# Patient Record
Sex: Male | Born: 1990 | Race: Black or African American | Hispanic: No | Marital: Married | Smoking: Never smoker
Health system: Southern US, Community
[De-identification: ages and names within clinical notes are randomized; demographics above are authoritative.]

## PROBLEM LIST (undated history)

## (undated) DIAGNOSIS — R000 Tachycardia, unspecified: Secondary | ICD-10-CM

## (undated) DIAGNOSIS — E119 Type 2 diabetes mellitus without complications: Secondary | ICD-10-CM

## (undated) DIAGNOSIS — U071 COVID-19: Secondary | ICD-10-CM

## (undated) DIAGNOSIS — E785 Hyperlipidemia, unspecified: Secondary | ICD-10-CM

## (undated) DIAGNOSIS — I10 Essential (primary) hypertension: Secondary | ICD-10-CM

## (undated) DIAGNOSIS — J45909 Unspecified asthma, uncomplicated: Secondary | ICD-10-CM

## (undated) HISTORY — DX: Hyperlipidemia, unspecified: E78.5

## (undated) HISTORY — DX: Tachycardia, unspecified: R00.0

## (undated) HISTORY — DX: Type 2 diabetes mellitus without complications: E11.9

## (undated) HISTORY — DX: Morbid (severe) obesity due to excess calories: E66.01

## (undated) HISTORY — DX: COVID-19: U07.1

## (undated) HISTORY — DX: Unspecified asthma, uncomplicated: J45.909

## (undated) HISTORY — DX: Essential (primary) hypertension: I10

## (undated) HISTORY — PX: OTHER SURGICAL HISTORY: SHX169

---
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
## 2010-06-04 IMAGING — CR LEFT GREAT TOE
1 series · 3 of 3 positions shown · non-contrast
Comparison: none

REASON FOR EXAM: Injury
COMMENTS:

PROCEDURE:     DXR - DXR TOE GREAT (1ST DIGIT) LT NAZARETH  - November 18, 2007  [DATE]
RESULT:     Images of the LEFT, great toe demonstrate no fracture,
dislocation or radiopaque foreign body.

[Series 1: view not recorded · 0.17mm/px · 3 of 3 slices shown]
[im 1/3]
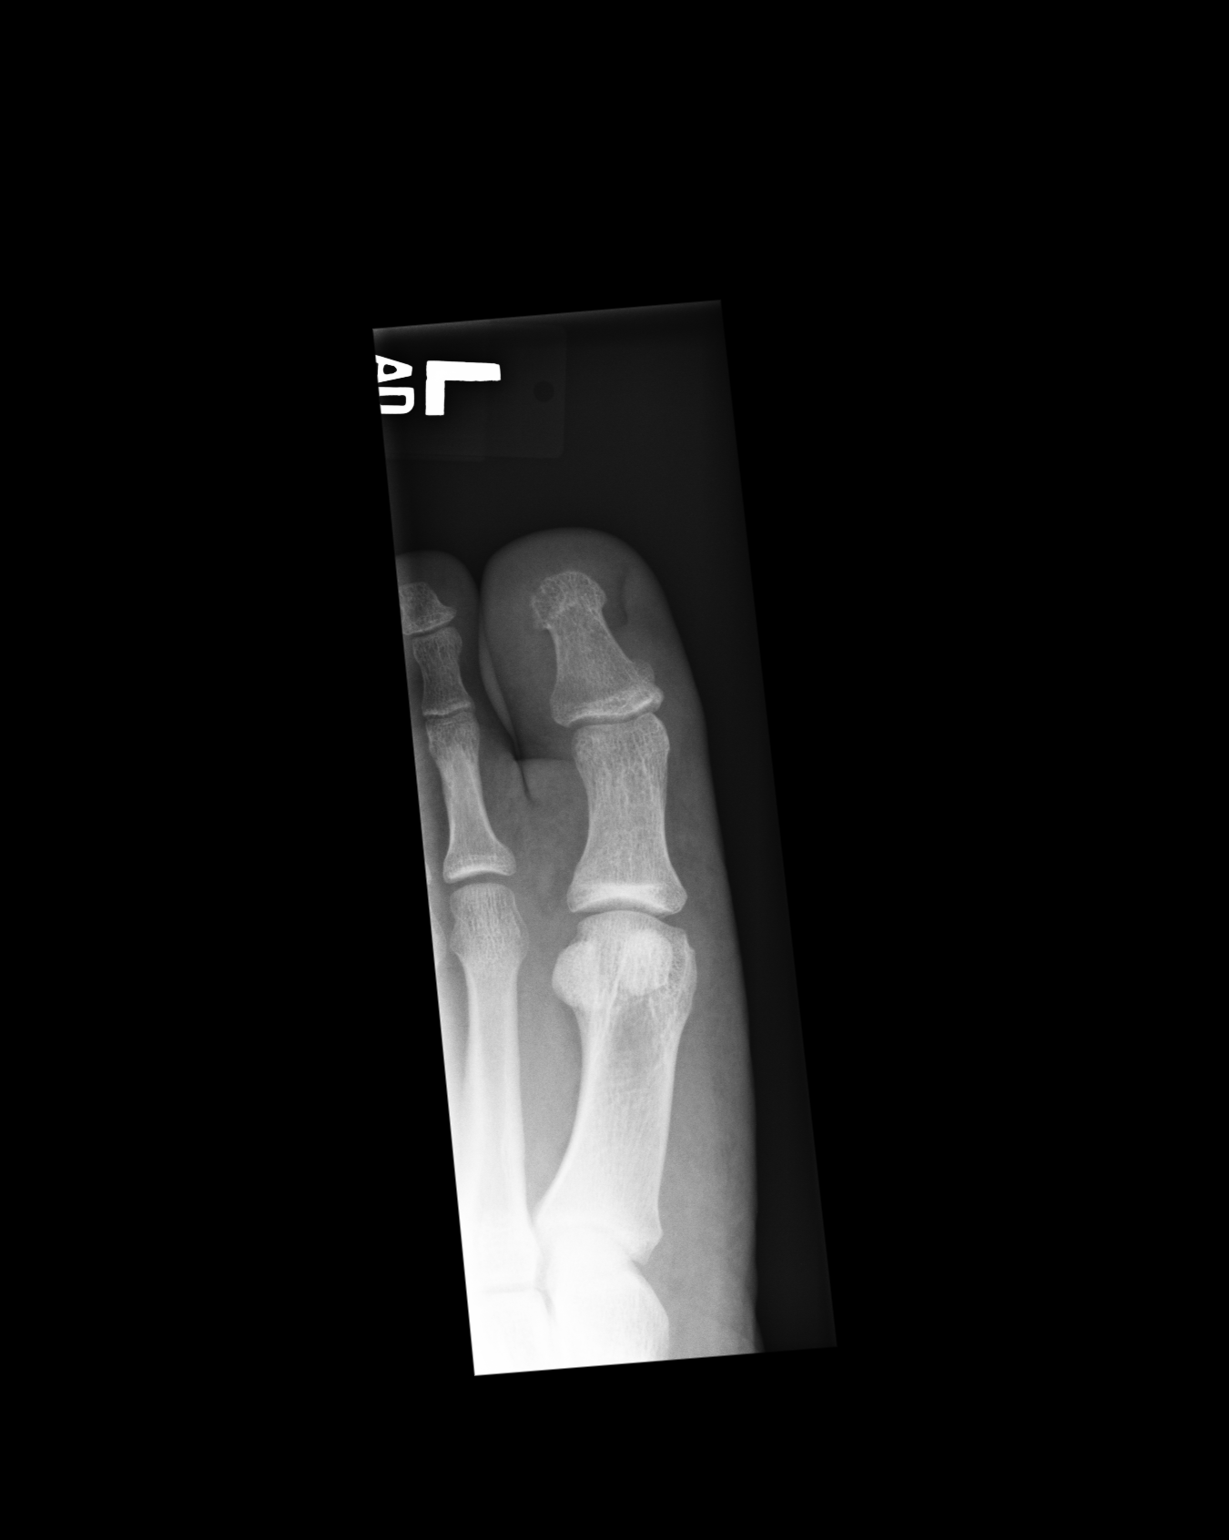
[im 2/3]
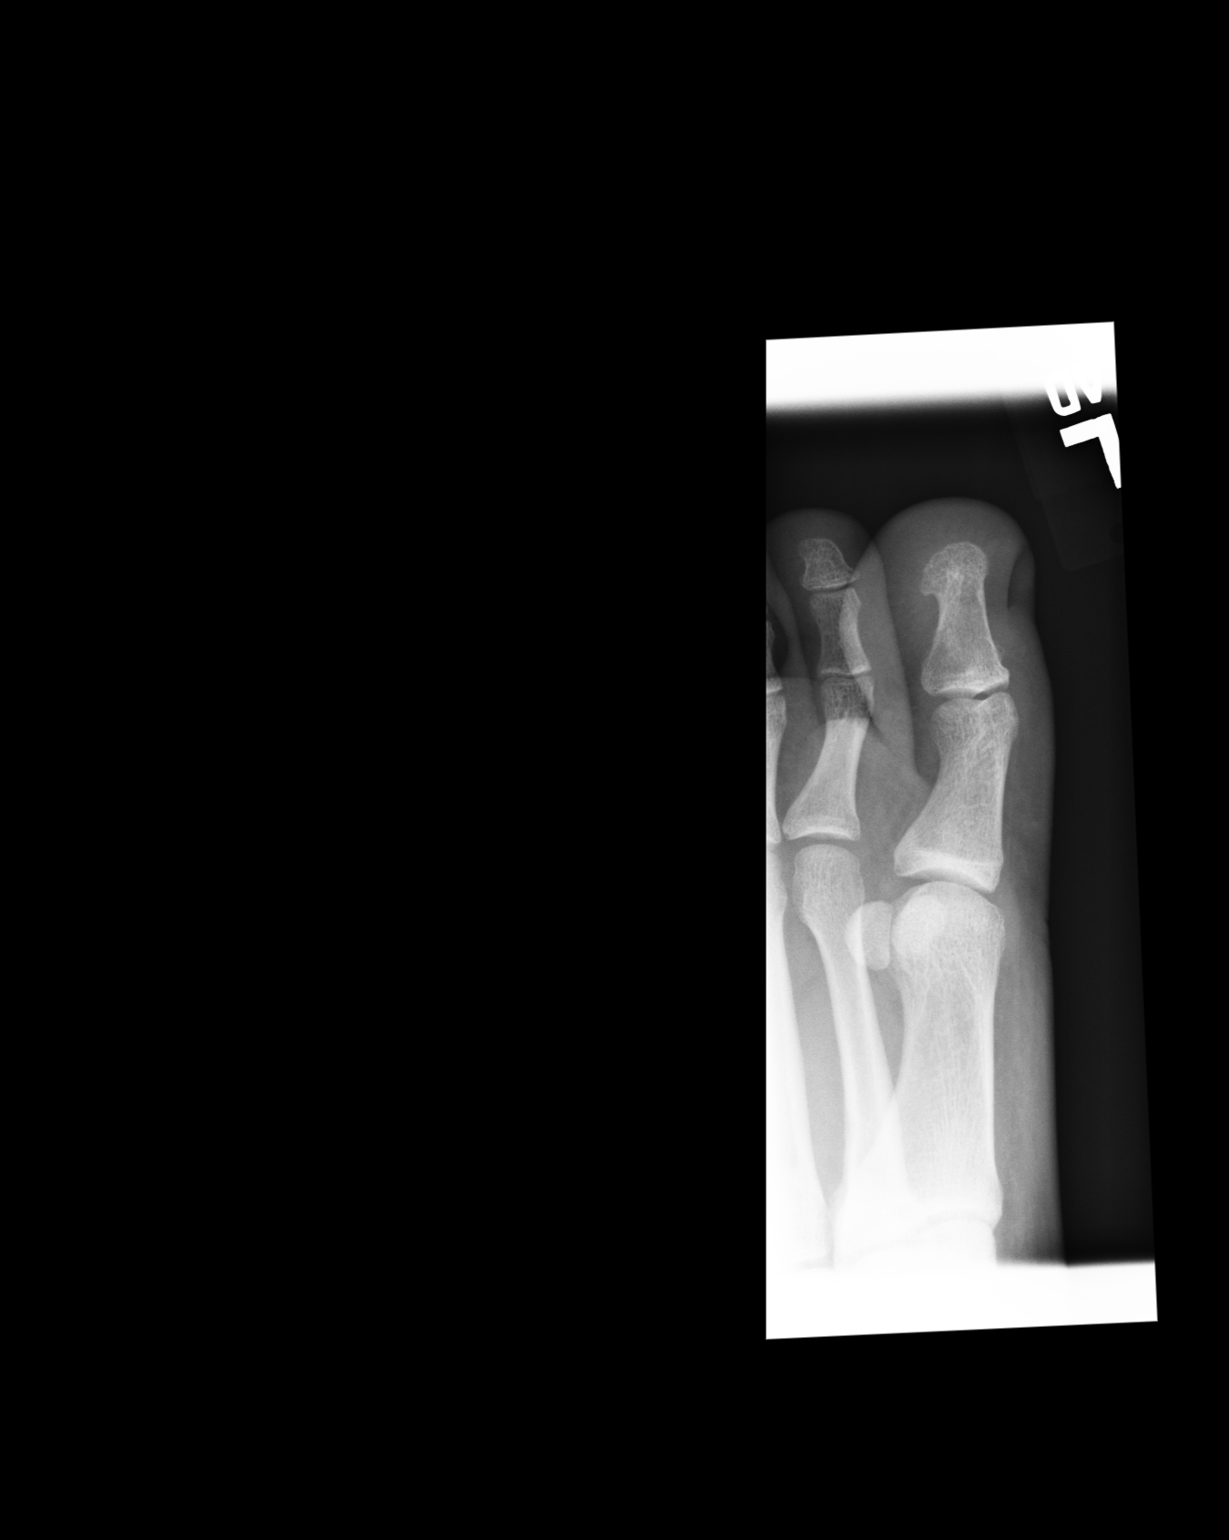
[im 3/3]
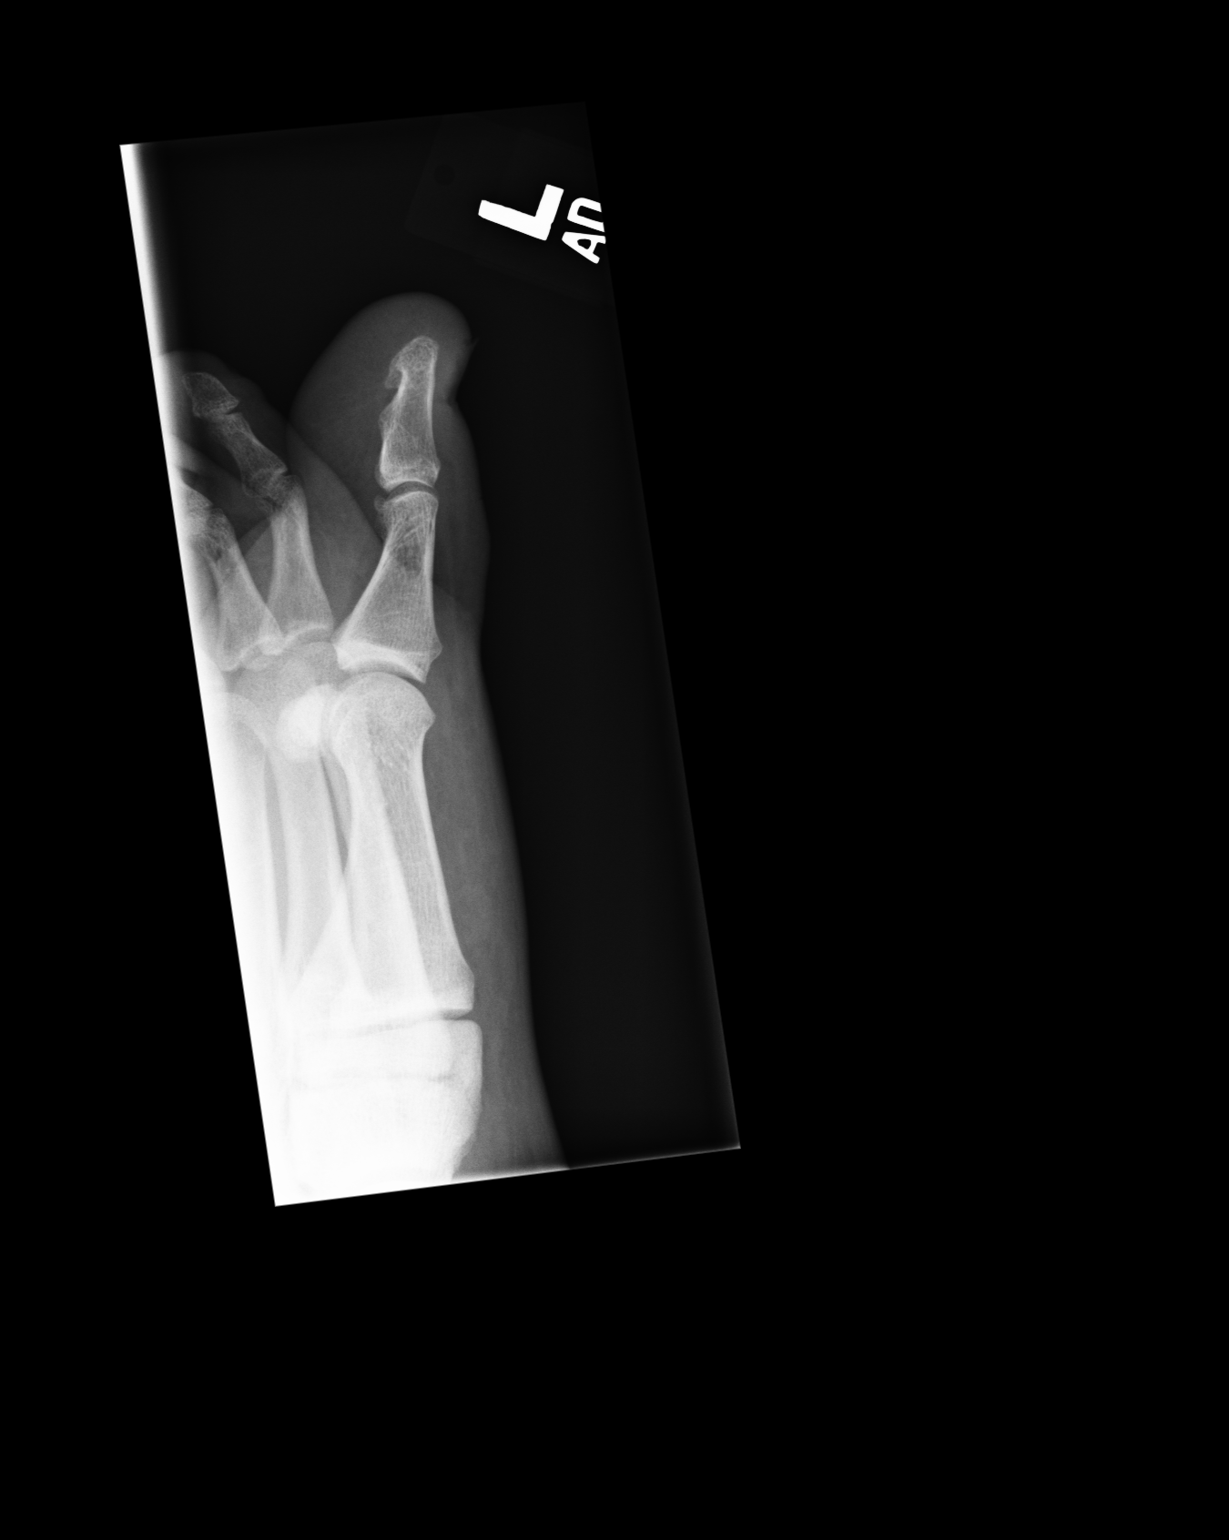

[3 of 3 positions shown; findings below may reference images not displayed]

IMPRESSION: Please see above.

## 2013-07-12 ENCOUNTER — Emergency Department: Payer: Self-pay | Admitting: Emergency Medicine

## 2015-09-05 ENCOUNTER — Encounter: Payer: Self-pay | Admitting: Physician Assistant

## 2015-09-05 ENCOUNTER — Ambulatory Visit: Payer: Self-pay | Admitting: Physician Assistant

## 2015-09-05 VITALS — BP 110/70 | HR 67 | Temp 98.1°F

## 2015-09-05 DIAGNOSIS — M545 Low back pain, unspecified: Secondary | ICD-10-CM

## 2015-09-05 MED ORDER — CYCLOBENZAPRINE HCL 10 MG PO TABS: 10.0000 mg | ORAL_TABLET | Freq: Three times a day (TID) | ORAL | 0 refills | Status: DC | PRN

## 2015-09-05 NOTE — Progress Notes (Addendum)
S:  C/o low back pain for 1 day, ? injury, helped build a fence yesterday, pain is worse with movement, increased with bending over, denies numbness, tingling, or changes in bowel/urinary habits, didn't try any otc meds Remainder ros neg  O:  Vitals wnl, nad, lungs c t a, cv rrr, spine nontender, muscles in r lower back tender , no decreased rom,  Neg slr, pt walks without difficulty, no foot drop noted, n/v intact  A: acute back pain, muscle spasms  P: use wet heat followed by ice, stretches, return to clinic if not better in 3 t 5 days, return earlier if worsening, rx meds: flexeril 10mg  tid prn muscle pain, work note for today, tomorrow if not better

## 2015-09-05 NOTE — Patient Instructions (Addendum)
Back Exercises If you have pain in your back, do these exercises 2-3 times each day or as told by your doctor. When the pain goes away, do the exercises once each day, but repeat the steps more times for each exercise (do more repetitions). If you do not have pain in your back, do these exercises once each day or as told by your doctor. EXERCISES Single Knee to Chest Do these steps 3-5 times in a row for each leg: 1. Lie on your back on a firm bed or the floor with your legs stretched out. 2. Bring one knee to your chest. 3. Hold your knee to your chest by grabbing your knee or thigh. 4. Pull on your knee until you feel a gentle stretch in your lower back. 5. Keep doing the stretch for 10-30 seconds. 6. Slowly let go of your leg and straighten it. Pelvic Tilt Do these steps 5-10 times in a row: 1. Lie on your back on a firm bed or the floor with your legs stretched out. 2. Bend your knees so they point up to the ceiling. Your feet should be flat on the floor. 3. Tighten your lower belly (abdomen) muscles to press your lower back against the floor. This will make your tailbone point up to the ceiling instead of pointing down to your feet or the floor. 4. Stay in this position for 5-10 seconds while you gently tighten your muscles and breathe evenly. Cat-Cow Do these steps until your lower back bends more easily: 1. Get on your hands and knees on a firm surface. Keep your hands under your shoulders, and keep your knees under your hips. You may put padding under your knees. 2. Let your head hang down, and make your tailbone point down to the floor so your lower back is round like the back of a cat. 3. Stay in this position for 5 seconds. 4. Slowly lift your head and make your tailbone point up to the ceiling so your back hangs low (sags) like the back of a cow. 5. Stay in this position for 5 seconds. Press-Ups Do these steps 5-10 times in a row: 1. Lie on your belly (face-down) on the  floor. 2. Place your hands near your head, about shoulder-width apart. 3. While you keep your back relaxed and keep your hips on the floor, slowly straighten your arms to raise the top half of your body and lift your shoulders. Do not use your back muscles. To make yourself more comfortable, you may change where you place your hands. 4. Stay in this position for 5 seconds. 5. Slowly return to lying flat on the floor. Bridges Do these steps 10 times in a row: 1. Lie on your back on a firm surface. 2. Bend your knees so they point up to the ceiling. Your feet should be flat on the floor. 3. Tighten your butt muscles and lift your butt off of the floor until your waist is almost as high as your knees. If you do not feel the muscles working in your butt and the back of your thighs, slide your feet 1-2 inches farther away from your butt. 4. Stay in this position for 3-5 seconds. 5. Slowly lower your butt to the floor, and let your butt muscles relax. If this exercise is too easy, try doing it with your arms crossed over your chest. Belly Crunches Do these steps 5-10 times in a row: 1. Lie on your back on a firm bed  or the floor with your legs stretched out. 2. Bend your knees so they point up to the ceiling. Your feet should be flat on the floor. 3. Cross your arms over your chest. 4. Tip your chin a little bit toward your chest but do not bend your neck. 5. Tighten your belly muscles and slowly raise your chest just enough to lift your shoulder blades a tiny bit off of the floor. 6. Slowly lower your chest and your head to the floor. Back Lifts Do these steps 5-10 times in a row: 1. Lie on your belly (face-down) with your arms at your sides, and rest your forehead on the floor. 2. Tighten the muscles in your legs and your butt. 3. Slowly lift your chest off of the floor while you keep your hips on the floor. Keep the back of your head in line with the curve in your back. Look at the floor while  you do this. 4. Stay in this position for 3-5 seconds. 5. Slowly lower your chest and your face to the floor. GET HELP IF:  Your back pain gets a lot worse when you do an exercise.  Your back pain does not lessen 2 hours after you exercise. If you have any of these problems, stop doing the exercises. Do not do them again unless your doctor says it is okay. GET HELP RIGHT AWAY IF:  You have sudden, very bad back pain. If this happens, stop doing the exercises. Do not do them again unless your doctor says it is okay.   This information is not intended to replace advice given to you by your health care provider. Make sure you discuss any questions you have with your health care provider.   Document Released: 02/27/2010 Document Revised: 10/16/2014 Document Reviewed: 03/21/2014 Elsevier Interactive Patient Education 2016 [place] Injury Prevention Back injuries can be very painful. They can also be difficult to heal. After having one back injury, you are more likely to injure your back again. It is important to learn how to avoid injuring or re-injuring your back. The following tips can help you to prevent a back injury. WHAT SHOULD I KNOW ABOUT PHYSICAL FITNESS?  Exercise for 30 minutes per day on most days of the week or as told by your doctor. Make sure to:  Do aerobic exercises, such as walking, jogging, biking, or swimming.  Do exercises that increase balance and strength, such as tai chi and yoga.  Do stretching exercises. This helps with flexibility.  Try to develop strong belly (abdominal) muscles. Your belly muscles help to support your back.  Stay at a healthy weight. This helps to decrease your risk of a back injury. WHAT SHOULD I KNOW ABOUT MY DIET?  Talk with your doctor about your overall diet. Take supplements and vitamins only as told by your doctor.  Talk with your doctor about how much calcium and vitamin D you need each day. These nutrients help to  prevent weakening of the bones (osteoporosis).  Include good sources of calcium in your diet, such as:  Dairy products.  Green leafy vegetables.  Products that have had calcium added to them (fortified).  Include good sources of vitamin D in your diet, such as:  Milk.  Foods that have had vitamin D added to them. WHAT SHOULD I KNOW ABOUT MY POSTURE?  Sit up straight and stand up straight. Avoid leaning forward when you sit or hunching over when you stand.  Choose chairs that  have good low-back (lumbar) support.  If you work at a desk, sit close to it so you do not need to lean over. Keep your chin tucked in. Keep your neck drawn back. Keep your elbows bent so your arms look like the letter "L" (right angle).  Sit high and close to the steering wheel when you drive. Add a low-back support to your car seat, if needed.  Avoid sitting or standing in one position for very long. Take breaks to get up, stretch, and walk around at least one time every hour. Take breaks every hour if you are driving for long periods of time.  Sleep on your side with your knees slightly bent, or sleep on your back with a pillow under your knees. Do not lie on the front of your body to sleep. WHAT SHOULD I KNOW ABOUT LIFTING, TWISTING, AND REACHING Lifting and Heavy Lifting  Avoid heavy lifting, especially lifting over and over again. If you must do heavy lifting:  Stretch before lifting.  Work slowly.  Rest between lifts.  Use a tool such as a cart or a dolly to move objects if one is available.  Make several small trips instead of carrying one heavy load.  Ask for help when you need it, especially when moving big objects.  Follow these steps when lifting:  Stand with your feet shoulder-width apart.  Get as close to the object as you can. Do not pick up a heavy object that is far from your body.  Use handles or lifting straps if they are available.  Bend at your knees. Squat down, but keep  your heels off the floor.  Keep your shoulders back. Keep your chin tucked in. Keep your back straight.  Lift the object slowly while you tighten the muscles in your legs, belly, and butt. Keep the object as close to the center of your body as possible.  Follow these steps when putting down a heavy load:  Stand with your feet shoulder-width apart.  Lower the object slowly while you tighten the muscles in your legs, belly, and butt. Keep the object as close to the center of your body as possible.  Keep your shoulders back. Keep your chin tucked in. Keep your back straight.  Bend at your knees. Squat down, but keep your heels off the floor.  Use handles or lifting straps if they are available. Twisting and Reaching  Avoid lifting heavy objects above your waist.  Do not twist at your waist while you are lifting or carrying a load. If you need to turn, move your feet.  Do not bend over without bending at your knees.  Avoid reaching over your head, across a table, or for an object on a high surface.  WHAT ARE SOME OTHER TIPS?  Avoid wet floors and icy ground. Keep sidewalks clear of ice to prevent falls.   Do not sleep on a mattress that is too soft or too hard.   Keep items that you use often within easy reach.   Put heavier objects on shelves at waist level, and put lighter objects on lower or higher shelves.  Find ways to lower your stress, such as:  Exercise.  Massage.  Relaxation techniques.  Talk with your doctor if you feel anxious or depressed. These conditions can make back pain worse.  Wear flat heel shoes with cushioned soles.  Avoid making quick (sudden) movements.  Use both shoulder straps when carrying a backpack.  Do not use any  tobacco products, including cigarettes, chewing tobacco, or electronic cigarettes. If you need help quitting, ask your doctor.   This information is not intended to replace advice given to you by your health care provider.  Make sure you discuss any questions you have with your health care provider.   Document Released: 07/14/2007 Document Revised: 06/11/2014 Document Reviewed: 01/29/2014 Elsevier Interactive Patient Education [employer].

## 2016-01-09 ENCOUNTER — Ambulatory Visit: Payer: Self-pay | Admitting: Physician Assistant

## 2016-01-09 ENCOUNTER — Encounter: Payer: Self-pay | Admitting: Physician Assistant

## 2016-01-09 VITALS — BP 139/89 | HR 90 | Temp 98.7°F

## 2016-01-09 DIAGNOSIS — J029 Acute pharyngitis, unspecified: Secondary | ICD-10-CM

## 2016-01-09 LAB — POCT RAPID STREP A (OFFICE): Rapid Strep A Screen: NEGATIVE

## 2016-01-09 MED ORDER — AZITHROMYCIN 250 MG PO TABS: ORAL_TABLET | ORAL | 0 refills | Status: DC

## 2016-01-09 NOTE — Progress Notes (Signed)
S: C/o sore throat and congestion for 3 days, no fever, chills, cp/sob, v/d; mucus is green and thick, cough is sporadic, c/o of facial and dental pain. Brother has the same sx but hasn't really been around him, they both work at the [employer]  Using otc meds:   O: PE: vitals wnl, nad, perrl eomi, normocephalic, tms dull, nasal mucosa red and swollen, throat injected, neck supple no lymph, lungs c t a, cv rrr, neuro intact, q strep neg  A:  Acute pharyngitis   P: drink fluids, continue regular meds , use otc meds of choice, return if not improving in 5 days, return earlier if worsening , zpack, work note for tonight, if has fever should not go tomorrow

## 2016-06-21 ENCOUNTER — Ambulatory Visit: Payer: Self-pay | Admitting: Family

## 2016-06-21 VITALS — BP 140/89 | HR 92 | Temp 98.3°F

## 2016-06-21 DIAGNOSIS — J301 Allergic rhinitis due to pollen: Secondary | ICD-10-CM

## 2016-06-21 DIAGNOSIS — J020 Streptococcal pharyngitis: Secondary | ICD-10-CM

## 2016-06-21 DIAGNOSIS — J0300 Acute streptococcal tonsillitis, unspecified: Secondary | ICD-10-CM

## 2016-06-21 DIAGNOSIS — J351 Hypertrophy of tonsils: Secondary | ICD-10-CM

## 2016-06-21 LAB — POCT RAPID STREP A (OFFICE): Rapid Strep A Screen: POSITIVE — AB

## 2016-06-21 MED ORDER — AMOXICILLIN-POT CLAVULANATE 875-125 MG PO TABS: 1.0000 | ORAL_TABLET | Freq: Two times a day (BID) | ORAL | 0 refills | Status: DC

## 2016-06-21 MED ORDER — DEXAMETHASONE SODIUM PHOSPHATE 10 MG/ML IJ SOLN
10.0000 mg | Freq: Once | INTRAMUSCULAR | Status: AC
  Administered 2016-06-21: 10 mg via INTRAMUSCULAR

## 2016-06-21 NOTE — Addendum Note (Signed)
Addended by: Catha BrowEACON, Taite Schoeppner T on: 06/21/2016 02:35 PM   Modules accepted: Orders
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
## 2016-06-24 ENCOUNTER — Encounter: Payer: Self-pay | Admitting: Physician Assistant

## 2016-06-24 ENCOUNTER — Ambulatory Visit: Payer: Self-pay | Admitting: Physician Assistant

## 2016-06-24 VITALS — BP 140/90 | HR 91 | Temp 98.5°F | Ht 73.0 in | Wt 395.0 lb

## 2016-06-24 DIAGNOSIS — Z0000 Encounter for general adult medical examination without abnormal findings: Secondary | ICD-10-CM

## 2016-06-24 NOTE — Progress Notes (Signed)
S: pt here for wellness physical had biometrics for insurance purposes done at work, no complaints ros neg. PMH:  neg  Social: drinks occ, nonsmoker, no drugs  Fam: htn, dm, grandfather died at 5541 of MI  O: vitals wnl, nad, ENT wnl, neck supple no lymph, lungs c t a, cv rrr, abd soft nontender bs normal all 4 quads  A: wellness physical, obesity  P: concentrate on weight loss, diet and exercise, repeat physical and biometrics in january

## 2016-09-24 ENCOUNTER — Ambulatory Visit: Payer: Self-pay | Admitting: Physician Assistant

## 2016-09-24 ENCOUNTER — Encounter: Payer: Self-pay | Admitting: Physician Assistant

## 2016-09-24 MED ORDER — PHENTERMINE HCL 37.5 MG PO CAPS: 37.5000 mg | ORAL_CAPSULE | ORAL | 0 refills | Status: DC

## 2016-09-24 NOTE — Progress Notes (Signed)
S: pt states he would like to try weight loss medication, he weighs 420lb, has been trying to cut carbs and is working out at [employer], his clothes are a little looser but just still wants to eat  O: vitals wnl, nad, lungs c t a, cv rrr  A: morbid obesity  P: phentermine 37.5 mg qd #30 nr, recheck in 1 month

## 2016-11-05 ENCOUNTER — Ambulatory Visit: Payer: Self-pay | Admitting: Physician Assistant

## 2016-11-05 ENCOUNTER — Encounter: Payer: Self-pay | Admitting: Physician Assistant

## 2016-11-05 MED ORDER — PHENTERMINE HCL 37.5 MG PO CAPS: 37.5000 mg | ORAL_CAPSULE | ORAL | 0 refills | Status: DC

## 2016-11-05 NOTE — Progress Notes (Signed)
S: pt here for weight check and diet counseling, states he has been able to cut back on junk food, is walking 2 miles every morning before work, is doing well on medication  O: vitals wnl, wt 399.4; last wt was 420lb, lungs c t a, cv rrr  A: morbid obesity, weight loss consult  P: phentermine 37.5mg  qd #30 recheck in 1 month, continue diet and exercise

## 2016-12-15 ENCOUNTER — Ambulatory Visit: Payer: Self-pay | Admitting: Physician Assistant

## 2016-12-15 ENCOUNTER — Encounter: Payer: Self-pay | Admitting: Physician Assistant

## 2016-12-15 MED ORDER — PHENTERMINE HCL 37.5 MG PO CAPS: 37.5000 mg | ORAL_CAPSULE | ORAL | 0 refills | Status: DC
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
## 2017-01-02 ENCOUNTER — Emergency Department: Payer: Managed Care, Other (non HMO)

## 2017-01-02 ENCOUNTER — Other Ambulatory Visit: Payer: Self-pay

## 2017-01-02 ENCOUNTER — Encounter: Payer: Self-pay | Admitting: Emergency Medicine

## 2017-01-02 ENCOUNTER — Emergency Department
Admission: EM | Admit: 2017-01-02 | Discharge: 2017-01-02 | Disposition: A | Discharge status: Home / Self Care | Payer: Managed Care, Other (non HMO) | Attending: Emergency Medicine | Admitting: Emergency Medicine

## 2017-01-02 DIAGNOSIS — Y9361 Activity, american tackle football: Secondary | ICD-10-CM | POA: Diagnosis not present

## 2017-01-02 DIAGNOSIS — S93401A Sprain of unspecified ligament of right ankle, initial encounter: Secondary | ICD-10-CM | POA: Insufficient documentation

## 2017-01-02 DIAGNOSIS — Z79899 Other long term (current) drug therapy: Secondary | ICD-10-CM | POA: Insufficient documentation

## 2017-01-02 DIAGNOSIS — Y999 Unspecified external cause status: Secondary | ICD-10-CM | POA: Insufficient documentation

## 2017-01-02 DIAGNOSIS — S99911A Unspecified injury of right ankle, initial encounter: Secondary | ICD-10-CM | POA: Diagnosis present

## 2017-01-02 DIAGNOSIS — Y92007 Garden or yard of unspecified non-institutional (private) residence as the place of occurrence of the external cause: Secondary | ICD-10-CM | POA: Insufficient documentation

## 2017-01-02 DIAGNOSIS — X509XXA Other and unspecified overexertion or strenuous movements or postures, initial encounter: Secondary | ICD-10-CM | POA: Diagnosis not present

## 2017-01-02 MED ORDER — KETOROLAC TROMETHAMINE 10 MG PO TABS: 10.0000 mg | ORAL_TABLET | Freq: Four times a day (QID) | ORAL | 0 refills | Status: DC | PRN

## 2017-01-02 MED ORDER — KETOROLAC TROMETHAMINE 30 MG/ML IJ SOLN
30.0000 mg | Freq: Once | INTRAMUSCULAR | Status: AC
  Administered 2017-01-02: 30 mg via INTRAMUSCULAR
  Filled 2017-01-02: qty 1

## 2017-01-02 NOTE — ED Triage Notes (Signed)
Pt states that he was playing football yesterday and tripped, hurting his ankle, Pt is having difficulty ambulating. Pt in NAD at this time.

## 2017-01-02 NOTE — ED Notes (Signed)
First Nurse Note: Pt c/o right ankle pain with difficulty ambulating. Pt in NAD at this time.

## 2017-01-02 NOTE — ED Notes (Signed)
ED crutches with weight limit of 300lb, walker with weight limit of 350lb. Pt weighs approx 380lb. PA notified. Rx written for crutches with higher weight limit. Pt informed of and in agreement with plan.

## 2017-01-02 NOTE — ED Notes (Signed)
See triage note. Pt c/o R ankle pain after injury playing football with family yesterday. Swelling noted to R lateral ankle. Pt states he has been using ibuprofen and ice at home with some pain relief.

## 2017-01-02 NOTE — ED Provider Notes (Signed)
[facility] Emergency Department Provider Note  ____________________________________________  Time seen: Approximately 12:06 PM  I have reviewed the triage vital signs and the nursing notes.   HISTORY  Chief Complaint Ankle Pain    HPI Rickey Payne is a [age] y.o. male that presents to the emergency department for evaluation of right ankle pain after injury yesterday. Patient was playing football in the backyard when he got his food caught in a ditch and rolled his ankle. It has been painful to walk on since. Pain is on the outside of his ankle. He has been taking Tyelnol with some relief. He iced ankle 3-4 times yesterday. No additional injuries. No numbness, tingling.    History reviewed. No pertinent past medical history.  There are no active problems to display for this patient.   History reviewed. No pertinent surgical history.  Prior to Admission medications   Medication Sig Start Date End Date Taking? Authorizing Provider  ketorolac (TORADOL) 10 MG tablet Take 1 tablet (10 mg total) by mouth every 6 (six) hours as needed. 01/02/17   Enid DerryWagner, Ravina Milner, PA-C  phentermine 37.5 MG capsule Take 1 capsule (37.5 mg total) every morning by mouth. 12/15/16   Faythe GheeFisher, Susan W, PA-C    Allergies Patient has no known allergies.  Family History  Problem Relation Age of Onset  . Hypertension Mother   . Hypertension Father   . Heart disease Maternal Grandfather   . Diabetes Paternal Grandmother     Social History Social History   Tobacco Use  . Smoking status: Never Smoker  . Smokeless tobacco: Never Used  Substance Use Topics  . Alcohol use: Yes  . Drug use: No     Review of Systems  Constitutional: No fever/chills Cardiovascular: No chest pain. Respiratory: No SOB. Gastrointestinal: No abdominal pain.  No nausea, no vomiting.  Musculoskeletal: Positive for ankle pain Skin: Negative for rash, abrasions, lacerations, ecchymosis. Neurological:  Negative for headaches, numbness or tingling   ____________________________________________   PHYSICAL EXAM:  VITAL SIGNS: ED Triage Vitals  Enc Vitals Group     BP 01/02/17 1049 (!) 141/90     Pulse Rate 01/02/17 1049 (!) 101     Resp 01/02/17 1049 16     Temp 01/02/17 1049 98.7 F (37.1 C)     Temp Source 01/02/17 1049 Oral     SpO2 01/02/17 1049 99 %     Weight 01/02/17 1050 (!) 395 lb (179.2 kg)     Height 01/02/17 1050 6\' 1"  (1.854 m)     Head Circumference --      Peak Flow --      Pain Score 01/02/17 1049 10     Pain Loc --      Pain Edu? --      Excl. in GC? --      Constitutional: Alert and oriented. Well appearing and in no acute distress. Eyes: Conjunctivae are normal. PERRL. EOMI. Head: Atraumatic. ENT:      Ears:      Nose: No congestion/rhinnorhea.      Mouth/Throat: Mucous membranes are moist.  Neck: No stridor.  Cardiovascular: Normal rate, regular rhythm.  Good peripheral circulation. Respiratory: Normal respiratory effort without tachypnea or retractions. Lungs CTAB. Good air entry to the bases with no decreased or absent breath sounds. Gastrointestinal: Bowel sounds 4 quadrants. Soft and nontender to palpation. No guarding or rigidity. No palpable masses. No distention.  Musculoskeletal: Full range of motion to all extremities. No gross deformities  appreciated. Mild swelling and tenderness to palpation over lateral malleolus. No ecchymosis.  Neurologic:  Normal speech and language. No gross focal neurologic deficits are appreciated.  Skin:  Skin is warm, dry and intact. No rash noted.   ____________________________________________   LABS (all labs ordered are listed, but only abnormal results are displayed)  Labs Reviewed - No data to display ____________________________________________  EKG   ____________________________________________  RADIOLOGY Lexine BatonI, Golden Emile, personally viewed and evaluated these images (plain radiographs) as part  of my medical decision making, as well as reviewing the written report by the radiologist.  Dg Ankle Complete Right  Result Date: 01/02/2017 CLINICAL DATA:  Acute right ankle pain after football injury yesterday. EXAM: RIGHT ANKLE - COMPLETE 3+ VIEW COMPARISON:  None. FINDINGS: There is no evidence of fracture, dislocation, or joint effusion. There is no evidence of arthropathy or other focal bone abnormality. Soft tissue swelling is seen over the lateral malleolus. IMPRESSION: No fracture or dislocation is noted. Soft tissue swelling is seen over lateral malleolus suggesting ligamentous injury. Electronically Signed   By: Lupita RaiderJames  Green Jr, M.D.   On: 01/02/2017 11:46    ____________________________________________    PROCEDURES  Procedure(s) performed:    Procedures    Medications  ketorolac (TORADOL) 30 MG/ML injection 30 mg (30 mg Intramuscular Given 01/02/17 1226)     ____________________________________________   INITIAL IMPRESSION / ASSESSMENT AND PLAN / ED COURSE  Pertinent labs & imaging results that were available during my care of the patient were reviewed by me and considered in my medical decision making (see chart for details).  Review of the [place] CSRS was performed in accordance of the [facility] prior to dispensing any controlled drugs.    Patient's diagnosis is consistent with ankle sprain. Vital signs and exam are reassuring. No acute bony abnormalities on xray. Xray suggest ligamentous injury.  Splint was placed.  Patient is over the weight requirements for her crutches so he was given a prescription for crutches.  Patient will be discharged home with prescriptions for toradol. Patient is to follow up with orthopedics as directed. Patient is given ED precautions to return to the ED for any worsening or new symptoms.     ____________________________________________  FINAL CLINICAL IMPRESSION(S) / ED DIAGNOSES  Final diagnoses:  Sprain of right ankle,  unspecified ligament, initial encounter       This chart was dictated using voice recognition software/Dragon. Despite best efforts to proofread, errors can occur which can change the meaning. Any change was purely unintentional.   Enid DerryWagner, Lauriel Helin, PA-C 01/02/17 1448  Governor RooksLord, Rebecca, MD 01/07/17 [phone]

## 2017-01-02 NOTE — ED Notes (Signed)

## 2017-01-04 ENCOUNTER — Ambulatory Visit: Payer: Self-pay | Admitting: Family

## 2017-01-04 VITALS — BP 139/70 | HR 78 | Temp 98.5°F | Resp 16

## 2017-01-04 DIAGNOSIS — S93401A Sprain of unspecified ligament of right ankle, initial encounter: Secondary | ICD-10-CM

## 2017-01-04 NOTE — Progress Notes (Signed)
S: [place], Here today to follow-up acute right ankle sprain while playing in the yard 2 days ago. Seen in the emergency room and x-rays were negative for fracture. He is in a splint and is using crutches. Swelling and pain improved still not able to bear full weight. O vital signs stable pain with flexion and internal rotation without instability, NV intact, slight edema  A right ankle sprain P note for out of work today. He will be off 2 days. Follow-up on Friday for work status. Continue Toradol by mouth, topical heat or ice and elevation.

## 2017-01-05 ENCOUNTER — Ambulatory Visit: Payer: Self-pay | Admitting: Physician Assistant

## 2017-01-07 ENCOUNTER — Ambulatory Visit: Payer: Self-pay | Admitting: Emergency Medicine

## 2017-01-07 VITALS — BP 150/90 | HR 97 | Temp 98.5°F | Resp 16

## 2017-01-07 DIAGNOSIS — S93401D Sprain of unspecified ligament of right ankle, subsequent encounter: Secondary | ICD-10-CM

## 2017-01-07 NOTE — Progress Notes (Signed)
S:  Here for recheck of ankle injury.  He is now able to bear weight without crutches.  Continues to have minimal pain to lateral aspect.   O:  OCL stirrup splints fits well. Here is some minimal tenderness on lateral malleolus with minimal soft tissue swelling. Pulses present. Motor sensory function intact. Able to bear weight and walk without assistance.   A:  Sprain right ankle P:  Wear splint for 1 week.  Continue NSAIDS.  rtn as needed

## 2017-01-14 ENCOUNTER — Ambulatory Visit: Payer: Self-pay | Admitting: Physician Assistant

## 2017-01-14 ENCOUNTER — Encounter: Payer: Self-pay | Admitting: Physician Assistant

## 2017-01-14 VITALS — BP 140/100 | HR 103 | Temp 98.5°F | Resp 16

## 2017-01-14 DIAGNOSIS — S93401S Sprain of unspecified ligament of right ankle, sequela: Secondary | ICD-10-CM

## 2017-01-14 NOTE — Progress Notes (Signed)
   Subjective: Sprain right ankle     Patient ID: Rickey Payne, male    DOB: 12/12/1990, 26 y.o.   MRN: [id number]  HPI Patient here today for reevaluation and sprain ankle for 10 days. Patient stated decreased pain oriented ankle support. Patient state unable to perform prolonged standing and running required by jaw.   Review of Systems Unremarkable except for complaint    Objective:   Physical Exam No obvious deformity to the right ankle. Patient is moderate guarding palpation inferior lateral malleolus. Patient decreased range of motion eversion of the right ankle. Patient walks normal gait using ankle support.       Assessment & Plan: Resolving ankle sprain.   Advised patient to continue using sterile ankle support. Modified duty for 3 days. Return to full duty on 01/17/2017.

## 2017-02-04 ENCOUNTER — Ambulatory Visit: Payer: Self-pay | Admitting: Nurse Practitioner

## 2017-02-04 VITALS — BP 120/70 | HR 97 | Temp 98.7°F | Resp 16

## 2017-02-04 DIAGNOSIS — S93401D Sprain of unspecified ligament of right ankle, subsequent encounter: Secondary | ICD-10-CM

## 2017-02-04 MED ORDER — PREDNISONE 10 MG (21) PO TBPK: ORAL_TABLET | ORAL | 0 refills | Status: AC

## 2017-02-04 MED ORDER — IBUPROFEN 800 MG PO TABS: 800.0000 mg | ORAL_TABLET | Freq: Three times a day (TID) | ORAL | 0 refills | Status: AC | PRN

## 2017-02-04 NOTE — Progress Notes (Signed)
Subjective:    Rickey Payne is a 26 y.o. male who presents with right foot pain. Onset of the symptoms was about a month ago2. Precipitating event: eversion injury to foot, increased activity and worsened after shoveling snow2. Current symptoms include: pain at the lateral aspect of the ankle, pain with eversion of the foot, swelling and worsening symptoms after a period of activity. Aggravating factors: going up and down stairs, lateral movements, pivoting, running and squatting. Symptoms have progressed to a point and plateaued. Patient has had no prior foot problems. Evaluation to date: patient has been seen at [facility] since symptom onset. Treatment to date: brace which is effective and prescription NSAIDS which are somewhat effective.  The following portions of the patient's history were reviewed and updated as appropriate: allergies, current medications and past medical history.  Review of Systems Constitutional: negative Respiratory: negative Cardiovascular: negative Musculoskeletal:positive for right foot pain and swelling, limited ROM Behavioral/Psych: negative    Objective:    There were no vitals taken for this visit. Right foot:  soft tissue swelling noted over the lateral malleolus with point tenderness  Left foot:  normal exam, no swelling, tenderness, instability; ligaments intact, full range of motion of all ankle/foot joints      Assessment:    Right Foot Sprain    Plan:    Natural history and expected course discussed. Questions answered. Home exercise plan outlined. Rest, ice, compression, and elevation (RICE) therapy. NSAIDs per medication orders. OTC analgesics as needed. Follow up in 7 days.  Patient informed to use ACE wrap in addition to brace.  Patient instructed to perform ROM exercises until he can achieve baseline ROM prior to injury.  Patient will use Extra Strength Tylenol while using Sterapred dose pack.  Patient also given prescription for  Ibuprofen 800mg  q 8 hours for 3 days after completing steroids.  Patient instructed to take ibuprofen with food and water.  Patient given note for work for light duty until 02/12/17.  Patient informed if symptoms do not improve, may need orthopedic referral.  Patient verbalized understanding.  Patient ambulatory at discharge.

## 2017-02-14 ENCOUNTER — Ambulatory Visit: Payer: Self-pay | Admitting: Physician Assistant

## 2017-02-14 ENCOUNTER — Encounter: Payer: Self-pay | Admitting: Physician Assistant

## 2017-02-14 VITALS — BP 120/80 | HR 101 | Temp 98.5°F | Resp 16

## 2017-02-14 DIAGNOSIS — S93401D Sprain of unspecified ligament of right ankle, subsequent encounter: Secondary | ICD-10-CM

## 2017-02-14 NOTE — Progress Notes (Signed)
   Subjective: Right ankle sprain     Patient ID: Rickey Payne, male    DOB: 1990/02/15, 27 y.o.   MRN: [id number]  HPI Patient presents for evaluation of right ankle sprain status post 4 weeks. Patient stated decreased pain with ambulation and requests a trial of full duties. Patient finished his steroid and nonsteroidal medications. Ambulating with supportive elastic Ace wrap.   Review of Systems Morbid obesity    Objective:   Physical Exam No obvious deformity of the right ankle. No guarding palpation. Patient has full and equal range of motion of the right ankle. Patient weight bears and ambulate with a slight atypical gait. Patient is able to raise on his toes and rock back on his heels. Patient able to maintain a squatting position with feet flat on the ground.       Assessment & Plan: Sprain right ankle   Resolving sprain right ankle. Patient advised to continue using elastic wrap for the next 5-7 days while at work. Patient given a trial full duty and advised return back to this clinic if his condition worsens.

## 2017-03-03 ENCOUNTER — Ambulatory Visit: Payer: Self-pay | Admitting: Medical

## 2017-03-03 VITALS — BP 130/80 | HR 112 | Temp 98.5°F | Resp 16 | Ht 73.0 in | Wt >= 6400 oz

## 2017-03-03 DIAGNOSIS — M25571 Pain in right ankle and joints of right foot: Secondary | ICD-10-CM

## 2017-03-03 DIAGNOSIS — Z008 Encounter for other general examination: Secondary | ICD-10-CM

## 2017-03-03 DIAGNOSIS — Z0189 Encounter for other specified special examinations: Principal | ICD-10-CM

## 2017-03-03 NOTE — Progress Notes (Signed)
   Subjective:    Patient ID: Rickey Payne, male    DOB: Jun 10, 1990, 27 y.o.   MRN: [id number]  HPI 27 yo male in non acute distress here for biometric screening.  Recheck on sprained  Right ankle  Xray  01/02/18 showed  no fracture or dislocation.  He complains of a sense of vibration and burning located behind the lateral malleolus. He is not sure if his boot is rubbing on the area, but is tender when he puts his boot on and it causes pain. He did finish 3 days of Ibuprofen at  800 mg TID. He complains of the area hurting a  10/10 when initially getting out of bed, he says he has to put on his sandals because the hardwood floor makes it hurt. Once his boot is on he states his pain is a  6-7/10 throughout the day. 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  Blood pressure 130/80, pulse (!) 112, temperature 98.5 F (36.9 C), temperature source Oral, resp. rate 16, height 6\' 1"  (1.854 m), weight (!) 415 lb (188.2 kg), SpO2 97 %.   Review of Systems  Constitutional: Positive for chills. Negative for fever.  HENT: Positive for congestion ("I have allergies" treats with nothing.). Negative for ear pain and sore throat.   Respiratory: Negative for cough and shortness of breath.   Cardiovascular: Negative for chest pain.  Gastrointestinal: Negative for abdominal pain.  Genitourinary: Negative for dysuria.  Musculoskeletal: Negative for myalgias.  Skin: Negative for rash.  Allergic/Immunologic: Positive for environmental allergies.  Neurological: Negative for dizziness, syncope and light-headedness.   Denies any numbness or tingling    Objective:   Physical Exam  Constitutional: He is oriented to person, place, and time. He appears well-developed and well-nourished.  HENT:  Head: Normocephalic and atraumatic.  Right Ear: External ear normal.  Left Ear: External ear normal.  Nose: Mucosal edema present.  Mouth/Throat: Oropharynx is clear and moist.  Eyes: Conjunctivae and  EOM are normal. Pupils are equal, round, and reactive to light.  Neck: Normal range of motion.  Cardiovascular: Normal rate, regular rhythm and normal heart sounds.  Pulmonary/Chest: Effort normal and breath sounds normal.  Musculoskeletal: Normal range of motion.  Neurological: He is alert and oriented to person, place, and time.  Skin: Skin is warm and dry. No rash noted. No erythema.  Psychiatric: He has a normal mood and affect. His behavior is normal. Judgment and thought content normal.  Nursing note and vitals reviewed.  Right ankle with mild swelling anterior to right lateral malleolus which patient says has improved since last visit.  Swelling posterior to right lateral malleolus tender to palpation. FROM , 2+PT pulse. Full weight bearing.  Able to flex and extend trunk and rotate trunk without difficulty.  Congestion right nare , no discharge.    Morbid obesity Assessment & Plan:  Biometric screening Still painful right ankle s/p sprain on 01/02/18 will refer to Orthopedics. Pending labs. Patient verbalizes understanding and has no questions at discharge.

## 2017-03-03 NOTE — Addendum Note (Signed)
Addended by: Catha BrowEACON, MONIQUE T on: 03/03/2017 11:55 AM   Modules accepted: Orders

## 2017-03-04 LAB — CMP12+LP+TP+TSH+6AC+CBC/D/PLT
ALBUMIN: 4.6 g/dL (ref 3.5–5.5)
ALK PHOS: 79 IU/L (ref 39–117)
ALT: 49 IU/L — AB (ref 0–44)
AST: 24 IU/L (ref 0–40)
Albumin/Globulin Ratio: 1.6 (ref 1.2–2.2)
BASOS: 1 %
BUN/Creatinine Ratio: 16 (ref 9–20)
BUN: 13 mg/dL (ref 6–20)
Basophils Absolute: 0 10*3/uL (ref 0.0–0.2)
Bilirubin Total: 0.3 mg/dL (ref 0.0–1.2)
CHLORIDE: 98 mmol/L (ref 96–106)
CHOLESTEROL TOTAL: 211 mg/dL — AB (ref 100–199)
Calcium: 9.7 mg/dL (ref 8.7–10.2)
Chol/HDL Ratio: 5 ratio (ref 0.0–5.0)
Creatinine, Ser: 0.79 mg/dL (ref 0.76–1.27)
EOS (ABSOLUTE): 0.3 10*3/uL (ref 0.0–0.4)
Eos: 7 %
Estimated CHD Risk: 1 times avg. (ref 0.0–1.0)
FREE THYROXINE INDEX: 1.3 (ref 1.2–4.9)
GFR calc Af Amer: 142 mL/min/{1.73_m2} (ref >59)
GFR calc non Af Amer: 123 mL/min/{1.73_m2} (ref >59)
GGT: 60 IU/L (ref 0–65)
GLOBULIN, TOTAL: 2.9 g/dL (ref 1.5–4.5)
Glucose: 111 mg/dL — ABNORMAL HIGH (ref 65–99)
HDL: 42 mg/dL (ref >39)
HEMATOCRIT: 45.7 % (ref 37.5–51.0)
Hemoglobin: 15.3 g/dL (ref 13.0–17.7)
IMMATURE GRANS (ABS): 0 10*3/uL (ref 0.0–0.1)
IMMATURE GRANULOCYTES: 0 %
IRON: 132 ug/dL (ref 38–169)
LDH: 219 IU/L (ref 121–224)
LDL CALC: 115 mg/dL — AB (ref 0–99)
LYMPHS ABS: 2.5 10*3/uL (ref 0.7–3.1)
LYMPHS: 47 %
MCH: 28.5 pg (ref 26.6–33.0)
MCHC: 33.5 g/dL (ref 31.5–35.7)
MCV: 85 fL (ref 79–97)
MONOS ABS: 0.5 10*3/uL (ref 0.1–0.9)
Monocytes: 9 %
NEUTROS PCT: 36 %
Neutrophils Absolute: 1.8 10*3/uL (ref 1.4–7.0)
PLATELETS: 367 10*3/uL (ref 150–379)
Phosphorus: 3.7 mg/dL (ref 2.5–4.5)
Potassium: 4.9 mmol/L (ref 3.5–5.2)
RBC: 5.36 x10E6/uL (ref 4.14–5.80)
RDW: 13.9 % (ref 12.3–15.4)
SODIUM: 141 mmol/L (ref 134–144)
T3 UPTAKE RATIO: 28 % (ref 24–39)
T4 TOTAL: 4.8 ug/dL (ref 4.5–12.0)
TOTAL PROTEIN: 7.5 g/dL (ref 6.0–8.5)
TRIGLYCERIDES: 269 mg/dL — AB (ref 0–149)
TSH: 1.6 u[IU]/mL (ref 0.450–4.500)
Uric Acid: 7.8 mg/dL (ref 3.7–8.6)
VLDL Cholesterol Cal: 54 mg/dL — ABNORMAL HIGH (ref 5–40)
WBC: 5.2 10*3/uL (ref 3.4–10.8)

## 2017-03-04 LAB — VITAMIN D 25 HYDROXY (VIT D DEFICIENCY, FRACTURES): Vit D, 25-Hydroxy: 8.4 ng/mL — ABNORMAL LOW (ref 30.0–100.0)

## 2017-03-07 NOTE — Progress Notes (Signed)
Please repeat Vitamin D level , same number as another Vitamin D level of a patient on the same day. Thank you .

## 2017-03-09 ENCOUNTER — Ambulatory Visit: Payer: Self-pay

## 2017-03-28 LAB — SPECIMEN STATUS REPORT

## 2017-03-28 LAB — HGB A1C W/O EAG: HEMOGLOBIN A1C: 6.8 % — AB (ref 4.8–5.6)

## 2017-04-06 NOTE — Progress Notes (Signed)
Call patient he needs a repeat  Vitamin D level. TY

## 2017-04-07 ENCOUNTER — Other Ambulatory Visit: Payer: Self-pay

## 2017-04-07 DIAGNOSIS — E559 Vitamin D deficiency, unspecified: Secondary | ICD-10-CM

## 2017-04-07 NOTE — Progress Notes (Signed)
Please see if you can schedule patient for lab follow up tomorrow. Thank you.

## 2017-04-08 ENCOUNTER — Telehealth: Payer: Self-pay | Admitting: Medical

## 2017-04-08 LAB — VITAMIN D 25 HYDROXY (VIT D DEFICIENCY, FRACTURES): Vit D, 25-Hydroxy: 11.1 ng/mL — ABNORMAL LOW (ref 30.0–100.0)

## 2017-04-08 NOTE — Telephone Encounter (Signed)
Delay in calling patient had patient return for Vit D level because two patient the same day has same numbers.  His new level is  11.1  Reviewed his labs and that he is a diabetic now and needs to do low glycemic foods and start walking.   Cholesterol elevated as well.  Recommended Vit D 3 4000IU/day Omega 3 2000mg  /day And low glycemic foods , avoiding sugar but may use sugar substitue like Splenda.  Given numbers of  [facility] and [facility]  To call and set up a primary care provider .  Recommended they set him up with dietitian for diabetic diet counseling as well.  He verbalizes understanding and has no questions at discharge.  He is thankful for my care.

## 2017-04-11 ENCOUNTER — Other Ambulatory Visit: Payer: Self-pay

## 2017-04-11 DIAGNOSIS — R7303 Prediabetes: Secondary | ICD-10-CM

## 2017-04-12 LAB — HGB A1C W/O EAG: HEMOGLOBIN A1C: 7 % — AB (ref 4.8–5.6)

## 2017-04-18 NOTE — Progress Notes (Signed)
TY

## 2017-04-18 NOTE — Progress Notes (Signed)
Please call patient and see if he has followed up with his primary docotr on this lab test. TY

## 2017-05-16 ENCOUNTER — Ambulatory Visit: Payer: Self-pay | Admitting: Family Medicine

## 2017-05-16 VITALS — BP 142/96 | HR 84 | Resp 20

## 2017-05-16 DIAGNOSIS — S93491D Sprain of other ligament of right ankle, subsequent encounter: Secondary | ICD-10-CM

## 2017-05-16 NOTE — Progress Notes (Signed)
Subjective: Clearance to return to work post ankle sprain Patient presents today for clearance to return to work following an ATFL sprain to his right ankle on 01/02/17.  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  Patient has been under the care of Dr. Landry MellowKubinski with orthopedics who prescribed physical therapy for the patient.  Patient completed physical therapy last week and reports resolution of his symptoms with the exception of occasional mild swelling to his anterior lateral ankle after extreme exertion.  Denies any mechanical symptoms or limitations on activity.  Patient reports that he feels he is ready to return to full duty.  Review of Systems Pertinent items noted in HPI and remainder of comprehensive ROS otherwise negative.    Objective:   Right ankle:  no effusion, full range of motion, no tenderness.Negative anterior drawer and talar tilt testing.  Normal exam of the right foot.  Normal gait.  Left ankle:  no effusion, full range of motion, no tenderness.  Normal exam of the left foot.     Assessment:    Right ATFL sprain - follow up visit for work clearance    Plan:    Natural history and expected course discussed. Questions answered. Cleared to return to full duty.   Advised patient that if he notes any recurrence of symptoms or signs of instability that he should be seen again by orthopedics, as this could potentially put him and others safety at risk at work.  Blood pressure is elevated today at 142/96.  Discussed this with patient and discussed normal values.  Patient reports he has an appointment scheduled with [facility] tomorrow to establish primary care there.  Advised him to discuss this with them tomorrow.  Patient verbally agreed.

## 2017-05-17 ENCOUNTER — Ambulatory Visit: Payer: Self-pay | Admitting: Internal Medicine

## 2017-05-20 ENCOUNTER — Encounter: Payer: Self-pay | Admitting: Internal Medicine

## 2017-05-20 ENCOUNTER — Ambulatory Visit (INDEPENDENT_AMBULATORY_CARE_PROVIDER_SITE_OTHER): Payer: Managed Care, Other (non HMO) | Admitting: Internal Medicine

## 2017-05-20 VITALS — BP 140/90 | HR 90 | Temp 98.3°F | Ht 73.0 in | Wt >= 6400 oz

## 2017-05-20 DIAGNOSIS — Z1159 Encounter for screening for other viral diseases: Secondary | ICD-10-CM | POA: Diagnosis not present

## 2017-05-20 DIAGNOSIS — E118 Type 2 diabetes mellitus with unspecified complications: Secondary | ICD-10-CM | POA: Insufficient documentation

## 2017-05-20 DIAGNOSIS — E119 Type 2 diabetes mellitus without complications: Secondary | ICD-10-CM | POA: Diagnosis not present

## 2017-05-20 DIAGNOSIS — E785 Hyperlipidemia, unspecified: Secondary | ICD-10-CM

## 2017-05-20 DIAGNOSIS — Z0184 Encounter for antibody response examination: Secondary | ICD-10-CM

## 2017-05-20 DIAGNOSIS — E559 Vitamin D deficiency, unspecified: Secondary | ICD-10-CM

## 2017-05-20 DIAGNOSIS — L730 Acne keloid: Secondary | ICD-10-CM

## 2017-05-20 DIAGNOSIS — Z6841 Body Mass Index (BMI) 40.0 and over, adult: Secondary | ICD-10-CM | POA: Insufficient documentation

## 2017-05-20 DIAGNOSIS — J302 Other seasonal allergic rhinitis: Secondary | ICD-10-CM

## 2017-05-20 DIAGNOSIS — R748 Abnormal levels of other serum enzymes: Secondary | ICD-10-CM | POA: Diagnosis not present

## 2017-05-20 DIAGNOSIS — I10 Essential (primary) hypertension: Secondary | ICD-10-CM

## 2017-05-20 DIAGNOSIS — K1370 Unspecified lesions of oral mucosa: Secondary | ICD-10-CM

## 2017-05-20 HISTORY — DX: Vitamin D deficiency, unspecified: E55.9

## 2017-05-20 LAB — HEPATIC FUNCTION PANEL
ALK PHOS: 66 U/L (ref 39–117)
ALT: 48 U/L (ref 0–53)
AST: 29 U/L (ref 0–37)
Albumin: 4.4 g/dL (ref 3.5–5.2)
BILIRUBIN DIRECT: 0 mg/dL (ref 0.0–0.3)
BILIRUBIN TOTAL: 0.4 mg/dL (ref 0.2–1.2)
Total Protein: 7.6 g/dL (ref 6.0–8.3)

## 2017-05-20 LAB — URINALYSIS, ROUTINE W REFLEX MICROSCOPIC
BILIRUBIN URINE: NEGATIVE
KETONES UR: NEGATIVE
LEUKOCYTES UA: NEGATIVE
NITRITE: NEGATIVE
PH: 6 (ref 5.0–8.0)
Specific Gravity, Urine: 1.01 (ref 1.000–1.030)
Total Protein, Urine: NEGATIVE
Urine Glucose: NEGATIVE
Urobilinogen, UA: 0.2 (ref 0.0–1.0)

## 2017-05-20 LAB — MICROALBUMIN / CREATININE URINE RATIO
Creatinine,U: 163.2 mg/dL
Microalb Creat Ratio: 2.1 mg/g (ref 0.0–30.0)
Microalb, Ur: 3.5 mg/dL — ABNORMAL HIGH (ref 0.0–1.9)

## 2017-05-20 MED ORDER — ATORVASTATIN CALCIUM 10 MG PO TABS: 10.0000 mg | ORAL_TABLET | Freq: Every day | ORAL | 3 refills | Status: DC

## 2017-05-20 MED ORDER — CHOLECALCIFEROL 1.25 MG (50000 UT) PO CAPS: 50000.0000 [IU] | ORAL_CAPSULE | ORAL | 1 refills | Status: DC

## 2017-05-20 MED ORDER — LOSARTAN POTASSIUM 25 MG PO TABS: 25.0000 mg | ORAL_TABLET | Freq: Every day | ORAL | 1 refills | Status: DC

## 2017-05-20 MED ORDER — EMPAGLIFLOZIN 10 MG PO TABS: 10.0000 mg | ORAL_TABLET | Freq: Every day | ORAL | 2 refills | Status: DC

## 2017-05-20 NOTE — Progress Notes (Signed)
Pre visit review using our clinic review tool, if applicable. No additional management support is needed unless otherwise documented below in the visit note. 

## 2017-05-20 NOTE — Patient Instructions (Addendum)
Try Flonase and Claritin, or Zyrtec or Allegra at night  Take Jardiance in your am for diabetes  Take Blood pressure medication in your am Losartan  Take cholesterol medication your pm I referred you to nutrition at [place]  I referred you to Dr. Granville Lewis ENT  Follow up in 3 months      Diabetes Mellitus and Exercise Exercising regularly is important for your overall health, especially when you have diabetes (diabetes mellitus). Exercising is not only about losing weight. It has many health benefits, such as increasing muscle strength and bone density and reducing body fat and stress. This leads to improved fitness, flexibility, and endurance, all of which result in better overall health. Exercise has additional benefits for people with diabetes, including:  Reducing appetite.  Helping to lower and control blood glucose.  Lowering blood pressure.  Helping to control amounts of fatty substances (lipids) in the blood, such as cholesterol and triglycerides.  Helping the body to respond better to insulin (improving insulin sensitivity).  Reducing how much insulin the body needs.  Decreasing the risk for heart disease by: ? Lowering cholesterol and triglyceride levels. ? Increasing the levels of good cholesterol. ? Lowering blood glucose levels.  What is my activity plan? Your health care provider or certified diabetes educator can help you make a plan for the type and frequency of exercise (activity plan) that works for you. Make sure that you:  Do at least 150 minutes of moderate-intensity or vigorous-intensity exercise each week. This could be brisk walking, biking, or water aerobics. ? Do stretching and strength exercises, such as yoga or weightlifting, at least 2 times a week. ? Spread out your activity over at least 3 days of the week.  Get some form of physical activity every day. ? Do not go more than 2 days in a row without some kind of physical activity. ? Avoid  being inactive for more than 90 minutes at a time. Take frequent breaks to walk or stretch.  Choose a type of exercise or activity that you enjoy, and set realistic goals.  Start slowly, and gradually increase the intensity of your exercise over time.  What do I need to know about managing my diabetes?  Check your blood glucose before and after exercising. ? If your blood glucose is higher than 240 mg/dL (40.9 mmol/L) before you exercise, check your urine for ketones. If you have ketones in your urine, do not exercise until your blood glucose returns to normal.  Know the symptoms of low blood glucose (hypoglycemia) and how to treat it. Your risk for hypoglycemia increases during and after exercise. Common symptoms of hypoglycemia can include: ? Hunger. ? Anxiety. ? Sweating and feeling clammy. ? Confusion. ? Dizziness or feeling light-headed. ? Increased heart rate or palpitations. ? Blurry vision. ? Tingling or numbness around the mouth, lips, or tongue. ? Tremors or shakes. ? Irritability.  Keep a rapid-acting carbohydrate snack available before, during, and after exercise to help prevent or treat hypoglycemia.  Avoid injecting insulin into areas of the body that are going to be exercised. For example, avoid injecting insulin into: ? The arms, when playing tennis. ? The legs, when jogging.  Keep records of your exercise habits. Doing this can help you and your health care provider adjust your diabetes management plan as needed. Write down: ? Food that you eat before and after you exercise. ? Blood glucose levels before and after you exercise. ? The type and amount of exercise  you have done. ? When your insulin is expected to peak, if you use insulin. Avoid exercising at times when your insulin is peaking.  When you start a new exercise or activity, work with your health care provider to make sure the activity is safe for you, and to adjust your insulin, medicines, or food intake  as needed.  Drink plenty of water while you exercise to prevent dehydration or heat stroke. Drink enough fluid to keep your urine clear or pale yellow. This information is not intended to replace advice given to you by your health care provider. Make sure you discuss any questions you have with your health care provider. Document Released: 04/17/2003 Document Revised: 08/15/2015 Document Reviewed: 07/07/2015 Elsevier Interactive Patient Education  2018 [employer].  Diabetes Mellitus and Nutrition When you have diabetes (diabetes mellitus), it is very important to have healthy eating habits because your blood sugar (glucose) levels are greatly affected by what you eat and drink. Eating healthy foods in the appropriate amounts, at about the same times every day, can help you:  Control your blood glucose.  Lower your risk of heart disease.  Improve your blood pressure.  Reach or maintain a healthy weight.  Every person with diabetes is different, and each person has different needs for a meal plan. Your health care provider may recommend that you work with a diet and nutrition specialist (dietitian) to make a meal plan that is best for you. Your meal plan may vary depending on factors such as:  The calories you need.  The medicines you take.  Your weight.  Your blood glucose, blood pressure, and cholesterol levels.  Your activity level.  Other health conditions you have, such as heart or kidney disease.  How do carbohydrates affect me? Carbohydrates affect your blood glucose level more than any other type of food. Eating carbohydrates naturally increases the amount of glucose in your blood. Carbohydrate counting is a method for keeping track of how many carbohydrates you eat. Counting carbohydrates is important to keep your blood glucose at a healthy level, especially if you use insulin or take certain oral diabetes medicines. It is important to know how many carbohydrates you can  safely have in each meal. This is different for every person. Your dietitian can help you calculate how many carbohydrates you should have at each meal and for snack. Foods that contain carbohydrates include:  Bread, cereal, rice, pasta, and crackers.  Potatoes and corn.  Peas, beans, and lentils.  Milk and yogurt.  Fruit and juice.  Desserts, such as cakes, cookies, ice cream, and candy.  How does alcohol affect me? Alcohol can cause a sudden decrease in blood glucose (hypoglycemia), especially if you use insulin or take certain oral diabetes medicines. Hypoglycemia can be a life-threatening condition. Symptoms of hypoglycemia (sleepiness, dizziness, and confusion) are similar to symptoms of having too much alcohol. If your health care provider says that alcohol is safe for you, follow these guidelines:  Limit alcohol intake to no more than 1 drink per day for nonpregnant women and 2 drinks per day for men. One drink equals 12 oz of beer, 5 oz of wine, or 1 oz of hard liquor.  Do not drink on an empty stomach.  Keep yourself hydrated with water, diet soda, or unsweetened iced tea.  Keep in mind that regular soda, juice, and other mixers may contain a lot of sugar and must be counted as carbohydrates.  What are tips for following this plan? Reading  food labels  Start by checking the serving size on the label. The amount of calories, carbohydrates, fats, and other nutrients listed on the label are based on one serving of the food. Many foods contain more than one serving per package.  Check the total grams (g) of carbohydrates in one serving. You can calculate the number of servings of carbohydrates in one serving by dividing the total carbohydrates by 15. For example, if a food has 30 g of total carbohydrates, it would be equal to 2 servings of carbohydrates.  Check the number of grams (g) of saturated and trans fats in one serving. Choose foods that have low or no amount of these  fats.  Check the number of milligrams (mg) of sodium in one serving. Most people should limit total sodium intake to less than 2,300 mg per day.  Always check the nutrition information of foods labeled as "low-fat" or "nonfat". These foods may be higher in added sugar or refined carbohydrates and should be avoided.  Talk to your dietitian to identify your daily goals for nutrients listed on the label. Shopping  Avoid buying canned, premade, or processed foods. These foods tend to be high in fat, sodium, and added sugar.  Shop around the outside edge of the grocery store. This includes fresh fruits and vegetables, bulk grains, fresh meats, and fresh dairy. Cooking  Use low-heat cooking methods, such as baking, instead of high-heat cooking methods like deep frying.  Cook using healthy oils, such as olive, canola, or sunflower oil.  Avoid cooking with butter, cream, or high-fat meats. Meal planning  Eat meals and snacks regularly, preferably at the same times every day. Avoid going long periods of time without eating.  Eat foods high in fiber, such as fresh fruits, vegetables, beans, and whole grains. Talk to your dietitian about how many servings of carbohydrates you can eat at each meal.  Eat 4-6 ounces of lean protein each day, such as lean meat, chicken, fish, eggs, or tofu. 1 ounce is equal to 1 ounce of meat, chicken, or fish, 1 egg, or 1/4 cup of tofu.  Eat some foods each day that contain healthy fats, such as avocado, nuts, seeds, and fish. Lifestyle   Check your blood glucose regularly.  Exercise at least 30 minutes 5 or more days each week, or as told by your health care provider.  Take medicines as told by your health care provider.  Do not use any products that contain nicotine or tobacco, such as cigarettes and e-cigarettes. If you need help quitting, ask your health care provider.  Work with a Veterinary surgeoncounselor or diabetes educator to identify strategies to manage stress  and any emotional and social challenges. What are some questions to ask my health care provider?  Do I need to meet with a diabetes educator?  Do I need to meet with a dietitian?  What number can I call if I have questions?  When are the best times to check my blood glucose? Where to find more information:  American Diabetes Association: diabetes.org/food-and-fitness/food  Academy of Nutrition and Dietetics: https://www.vargas.com/www.eatright.org/resources/health/diseases-and-conditions/diabetes  [employer] Institute of Diabetes and Digestive and Kidney Diseases (NIH): FindJewelers.czwww.niddk.nih.gov/health-information/diabetes/overview/diet-eating-physical-activity Summary  A healthy meal plan will help you control your blood glucose and maintain a healthy lifestyle.  Working with a diet and nutrition specialist (dietitian) can help you make a meal plan that is best for you.  Keep in mind that carbohydrates and alcohol have immediate effects on your blood glucose levels. It is  important to count carbohydrates and to use alcohol carefully. This information is not intended to replace advice given to you by your health care provider. Make sure you discuss any questions you have with your health care provider. Document Released: 10/22/2004 Document Revised: 03/01/2016 Document Reviewed: 03/01/2016 Elsevier Interactive Patient Education  [employer].

## 2017-05-22 LAB — HEPATITIS B SURFACE ANTIGEN: HEP B S AG: NONREACTIVE

## 2017-05-22 LAB — HEPATITIS B SURFACE ANTIBODY, QUANTITATIVE

## 2017-05-24 ENCOUNTER — Encounter: Payer: Self-pay | Admitting: Internal Medicine

## 2017-05-24 NOTE — Progress Notes (Addendum)
Chief Complaint  Patient presents with  . Establish Care   New patient needs PCP 1. DM 2 a1C 7.0 new dx for patient  2. Obesity BMI >56 does not exercise and we disc'ed healthy diet choices today  3. Vit D def not currently on meds    Review of Systems  Constitutional: Negative for weight loss.  HENT: Negative for hearing loss.   Eyes: Negative for blurred vision.  Respiratory: Negative for shortness of breath.   Cardiovascular: Negative for chest pain.  Gastrointestinal: Negative for abdominal pain.  Musculoskeletal: Negative for falls.  Skin: Negative for rash.  Neurological: Negative for headaches.  Psychiatric/Behavioral: Negative for depression.   Past Medical History:  Diagnosis Date  . Asthma   . Hyperlipidemia   . Hypertension    Past Surgical History:  Procedure Laterality Date  . right wrist fracture     Family History  Problem Relation Age of Onset  . Hypertension Mother   . Arthritis Mother   . Asthma Mother   . Depression Mother   . Hyperlipidemia Mother   . Hypertension Father   . Depression Father   . Hyperlipidemia Father   . Intellectual disability Father   . Stroke Father   . Heart disease Maternal Grandfather   . Diabetes Paternal Grandmother   . Drug abuse Paternal Grandmother    Social History   Socioeconomic History  . Marital status: Married    Spouse name: Not on file  . Number of children: Not on file  . Years of education: Not on file  . Highest education level: Not on file  Occupational History  . Not on file  Social Needs  . Financial resource strain: Not on file  . Food insecurity:    Worry: Not on file    Inability: Not on file  . Transportation needs:    Medical: Not on file    Non-medical: Not on file  Tobacco Use  . Smoking status: Never Smoker  . Smokeless tobacco: Never Used  Substance and Sexual Activity  . Alcohol use: Yes  . Drug use: No  . Sexual activity: Yes  Lifestyle  . Physical activity:    Days per  week: Not on file    Minutes per session: Not on file  . Stress: Not on file  Relationships  . Social connections:    Talks on phone: Not on file    Gets together: Not on file    Attends religious service: Not on file    Active member of club or organization: Not on file    Attends meetings of clubs or organizations: Not on file    Relationship status: Not on file  . Intimate partner violence:    Fear of current or ex partner: Not on file    Emotionally abused: Not on file    Physically abused: Not on file    Forced sexual activity: Not on file  Other Topics Concern  . Not on file  Social History Narrative   Diploma, deputy detentRisk managerion officer    Married    Drinks occasionally    Never smoker, no chew    Owns guns, wears seat belt, safe in relationship       No outpatient medications have been marked as taking for the 05/20/17 encounter (Office Visit) with [name], Pasty Spillersracy N, MD.   No Known Allergies Recent Results (from the past 2160 hour(s))  Vitamin D (25 hydroxy)     Status: Abnormal   Collection  Time: 03/03/17  9:00 AM  Result Value Ref Range   Vit D, 25-Hydroxy 8.4 (L) 30.0 - 100.0 ng/mL    Comment: Vitamin D deficiency has been defined by the Institute of Medicine and an Endocrine Society practice guideline as a level of serum 25-OH vitamin D less than 20 ng/mL (1,2). The Endocrine Society went on to further define vitamin D insufficiency as a level between 21 and 29 ng/mL (2). 1. IOM (Institute of Medicine). 2010. Dietary reference    intakes for calcium and D. [place]: The    [employer]. 2. Holick MF, Binkley Dukes, Bischoff-Ferrari HA, et al.    Evaluation, treatment, and prevention of vitamin D    deficiency: an Endocrine Society clinical practice    guideline. JCEM. 2011 Jul; 96(7):1911-30.   Executive Panel     Status: Abnormal   Collection Time: 03/03/17  9:00 AM  Result Value Ref Range   Glucose 111 (H) 65 - 99 mg/dL   Uric Acid  7.8 3.7 - 8.6 mg/dL    Comment:            Therapeutic target for gout patients: <6.0   BUN 13 6 - 20 mg/dL   Creatinine, Ser 1.61 0.76 - 1.27 mg/dL   GFR calc non Af Amer 123 >59 mL/min/1.73   GFR calc Af Amer 142 >59 mL/min/1.73   BUN/Creatinine Ratio 16 9 - 20   Sodium 141 134 - 144 mmol/L   Potassium 4.9 3.5 - 5.2 mmol/L   Chloride 98 96 - 106 mmol/L   Calcium 9.7 8.7 - 10.2 mg/dL   Phosphorus 3.7 2.5 - 4.5 mg/dL   Total Protein 7.5 6.0 - 8.5 g/dL   Albumin 4.6 3.5 - 5.5 g/dL   Globulin, Total 2.9 1.5 - 4.5 g/dL   Albumin/Globulin Ratio 1.6 1.2 - 2.2   Bilirubin Total 0.3 0.0 - 1.2 mg/dL   Alkaline Phosphatase 79 39 - 117 IU/L   LDH 219 121 - 224 IU/L   AST 24 0 - 40 IU/L   ALT 49 (H) 0 - 44 IU/L   GGT 60 0 - 65 IU/L   Iron 132 38 - 169 ug/dL   Cholesterol, Total 096 (H) 100 - 199 mg/dL   Triglycerides 045 (H) 0 - 149 mg/dL   HDL 42 >40 mg/dL   VLDL Cholesterol Cal 54 (H) 5 - 40 mg/dL   LDL Calculated 981 (H) 0 - 99 mg/dL   Chol/HDL Ratio 5.0 0.0 - 5.0 ratio    Comment:                                   T. Chol/HDL Ratio                                             Men  Women                               1/2 Avg.Risk  3.4    3.3                                   Avg.Risk  5.0    4.4  2X Avg.Risk  9.6    7.1                                3X Avg.Risk 23.4   11.0    Estimated CHD Risk 1.0 0.0 - 1.0 times avg.    Comment:                                   T. Chol/HDL Ratio                                             Men  Women                               1/2 Avg.Risk  3.4    3.3                                   Avg.Risk  5.0    4.4                                2X Avg.Risk  9.6    7.1                                3X Avg.Risk 23.4   11.0 The CHD Risk is based on the T. Chol/HDL ratio.  Other factors affect CHD Risk such as hypertension, smoking, diabetes, severe obesity, and family history of pre- mature CHD.    TSH 1.600 0.450 -  4.500 uIU/mL   T4, Total 4.8 4.5 - 12.0 ug/dL   T3 Uptake Ratio 28 24 - 39 %   Free Thyroxine Index 1.3 1.2 - 4.9   WBC 5.2 3.4 - 10.8 x10E3/uL   RBC 5.36 4.14 - 5.80 x10E6/uL   Hemoglobin 15.3 13.0 - 17.7 g/dL   Hematocrit 16.1 09.6 - 51.0 %   MCV 85 79 - 97 fL   MCH 28.5 26.6 - 33.0 pg   MCHC 33.5 31.5 - 35.7 g/dL   RDW 04.5 40.9 - 81.1 %   Platelets 367 150 - 379 x10E3/uL   Neutrophils 36 Not Estab. %   Lymphs 47 Not Estab. %   Monocytes 9 Not Estab. %   Eos 7 Not Estab. %   Basos 1 Not Estab. %   Neutrophils Absolute 1.8 1.4 - 7.0 x10E3/uL   Lymphocytes Absolute 2.5 0.7 - 3.1 x10E3/uL   Monocytes Absolute 0.5 0.1 - 0.9 x10E3/uL   EOS (ABSOLUTE) 0.3 0.0 - 0.4 x10E3/uL   Basophils Absolute 0.0 0.0 - 0.2 x10E3/uL   Immature Granulocytes 0 Not Estab. %   Immature Grans (Abs) 0.0 0.0 - 0.1 x10E3/uL  Hgb A1c w/o eAG     Status: Abnormal   Collection Time: 03/03/17  9:00 AM  Result Value Ref Range   Hgb A1c MFr Bld 6.8 (H) 4.8 - 5.6 %    Comment:          Prediabetes: 5.7 - 6.4  Diabetes: >6.4          Glycemic control for adults with diabetes: <7.0   Specimen status report     Status: None   Collection Time: 03/03/17  9:00 AM  Result Value Ref Range   specimen status report Comment     Comment: Written Authorization Written Authorization No Written Authorization Received.   Vitamin D (25 hydroxy)     Status: Abnormal   Collection Time: 04/07/17  1:00 PM  Result Value Ref Range   Vit D, 25-Hydroxy 11.1 (L) 30.0 - 100.0 ng/mL    Comment: Vitamin D deficiency has been defined by the Institute of Medicine and an Endocrine Society practice guideline as a level of serum 25-OH vitamin D less than 20 ng/mL (1,2). The Endocrine Society went on to further define vitamin D insufficiency as a level between 21 and 29 ng/mL (2). 1. IOM (Institute of Medicine). 2010. Dietary reference    intakes for calcium and D. [place]: The    [employer]. 2. Holick  MF, Binkley , Bischoff-Ferrari HA, et al.    Evaluation, treatment, and prevention of vitamin D    deficiency: an Endocrine Society clinical practice    guideline. JCEM. 2011 Jul; 96(7):1911-30.   Hemoglobin A1c     Status: Abnormal   Collection Time: 04/11/17  8:00 AM  Result Value Ref Range   Hgb A1c MFr Bld 7.0 (H) 4.8 - 5.6 %    Comment:          Prediabetes: 5.7 - 6.4          Diabetes: >6.4          Glycemic control for adults with diabetes: <7.0   Urine Microalbumin w/creat. ratio     Status: Abnormal   Collection Time: 05/20/17 11:26 AM  Result Value Ref Range   Microalb, Ur 3.5 (H) 0.0 - 1.9 mg/dL   Creatinine,U 409.8 mg/dL   Microalb Creat Ratio 2.1 0.0 - 30.0 mg/g  Hepatitis B surface antibody     Status: Abnormal   Collection Time: 05/20/17 11:26 AM  Result Value Ref Range   Hepatitis B-Post <5 (L) > OR = 10 mIU/mL    Comment: . Patient does not have immunity to hepatitis B virus. . For additional information, please refer to http://education.questdiagnostics.com/faq/FAQ105 (This link is being provided for informational/ educational purposes only).   Hepatitis B surface antigen     Status: None   Collection Time: 05/20/17 11:26 AM  Result Value Ref Range   Hepatitis B Surface Ag NON-REACTIVE NON-REACTI  Hepatic function panel     Status: None   Collection Time: 05/20/17 11:26 AM  Result Value Ref Range   Total Bilirubin 0.4 0.2 - 1.2 mg/dL   Bilirubin, Direct 0.0 0.0 - 0.3 mg/dL   Alkaline Phosphatase 66 39 - 117 U/L   AST 29 0 - 37 U/L   ALT 48 0 - 53 U/L   Total Protein 7.6 6.0 - 8.3 g/dL   Albumin 4.4 3.5 - 5.2 g/dL  Urinalysis, Routine w reflex microscopic     Status: Abnormal   Collection Time: 05/20/17 11:26 AM  Result Value Ref Range   Color, Urine YELLOW Yellow;Lt. Yellow   APPearance CLEAR Clear   Specific Gravity, Urine 1.010 1.000 - 1.030   pH 6.0 5.0 - 8.0   Total Protein, Urine NEGATIVE Negative   Urine Glucose NEGATIVE Negative    Ketones, ur NEGATIVE Negative   Bilirubin Urine NEGATIVE Negative  Hgb urine dipstick TRACE-INTACT (A) Negative   Urobilinogen, UA 0.2 0.0 - 1.0   Leukocytes, UA NEGATIVE Negative   Nitrite NEGATIVE Negative   WBC, UA 0-2/hpf 0-2/hpf   RBC / HPF 0-2/hpf 0-2/hpf   Squamous Epithelial / LPF Rare(0-4/hpf) Rare(0-4/hpf)   Renal Epithel, UA Rare(0-4/hpf) (A) None   Objective  Body mass index is 56.73 kg/m. Wt Readings from Last 3 Encounters:  05/20/17 (!) 430 lb (195 kg)  03/03/17 (!) 415 lb (188.2 kg)  01/02/17 (!) 395 lb (179.2 kg)   Temp Readings from Last 3 Encounters:  05/20/17 98.3 F (36.8 C) (Oral)  03/03/17 98.5 F (36.9 C) (Oral)  02/14/17 98.5 F (36.9 C) (Oral)   BP Readings from Last 3 Encounters:  05/20/17 140/90  05/16/17 (!) 142/96  03/03/17 130/80   Pulse Readings from Last 3 Encounters:  05/20/17 90  05/16/17 84  03/03/17 (!) 112    Physical Exam  Constitutional: He is oriented to person, place, and time. Vital signs are normal. He appears well-developed and well-nourished.  HENT:  Head: Normocephalic and atraumatic.  Mouth/Throat: Oropharynx is clear and moist and mucous membranes are normal. Oral lesions present.  Right uvula growth since 06/2016 no change in size per pt   Eyes: Pupils are equal, round, and reactive to light. Conjunctivae are normal.  Cardiovascular: Normal rate, regular rhythm and normal heart sounds.  Pulmonary/Chest: Effort normal and breath sounds normal.  Neurological: He is alert and oriented to person, place, and time. Gait normal.  Skin: Skin is warm and dry.     Psychiatric: He has a normal mood and affect. His speech is normal and behavior is normal. Judgment and thought content normal. Cognition and memory are normal.  Nursing note and vitals reviewed.   Assessment   1. DM 2  2. Obesity BMI >56  3. Vit D def  4. Acne keloidalis nuchae  5. Uvula lesion  6. HM Plan   1.  Start jardiance 10, lipitor 10, losartan  25  rec see eye MD pt wants to think about it  Do foot exam at f/u  Need to disc pna 23 at f/u  Referred to nutrition  Labs today  2.  rec exercise and healthy diet choices 3. 50K IU qd  4. F/u dermatology requested records  5. Refer to ENT Dr. Jenne Campus  6.  Did not have flu shot  Tdap had 02/28/08 per pt  Check labs hep B sAg, titer, UA, urine protein, lfts   Reviewed derm note 05/16/17 given clindamycin 1% lotion and halobetasol topical foam 0.05%  Dr. Gwen Pounds   Provider: Dr. French Ana [name]-Internal Medicine

## 2017-06-06 ENCOUNTER — Encounter: Payer: Self-pay | Admitting: *Deleted

## 2017-06-06 ENCOUNTER — Encounter: Payer: Managed Care, Other (non HMO) | Attending: Internal Medicine | Admitting: *Deleted

## 2017-06-06 VITALS — BP 132/80 | Ht 73.0 in | Wt >= 6400 oz

## 2017-06-06 DIAGNOSIS — E119 Type 2 diabetes mellitus without complications: Secondary | ICD-10-CM | POA: Insufficient documentation

## 2017-06-06 DIAGNOSIS — Z713 Dietary counseling and surveillance: Secondary | ICD-10-CM | POA: Insufficient documentation

## 2017-06-06 NOTE — Progress Notes (Signed)
Diabetes Self-Management Education  Visit Type: First/Initial  Appt. Start Time: 1320 Appt. End Time: 1425  06/06/2017  [name], identified by name and date of birth, is a 27 y.o. male with a diagnosis of Diabetes: Type 2.   ASSESSMENT  Blood pressure 132/80, height  (1.854 m), weight (!) 421 lb 3.2 oz (191.1 kg). Body mass index is 55.57 kg/m.  Diabetes Self-Management Education - 06/06/17 1451      Visit Information   Visit Type  First/Initial      Initial Visit   Diabetes Type  Type 2    Are you currently following a meal plan?  No    Are you taking your medications as prescribed?  Yes    Date Diagnosed  2 months ago      Health Coping   How would you rate your overall health?  Fair      Psychosocial Assessment   Patient Belief/Attitude about Diabetes  Motivated to manage diabetes    Self-care barriers  None    Self-management support  Doctor's office;Family    Other persons present  Family Member young son    Patient Concerns  Nutrition/Meal planning;Glycemic Control;Weight Control;Healthy Lifestyle    Special Needs  None    Preferred Learning Style  Auditory;Visual;Hands on    Learning Readiness  Ready    How often do you need to have someone help you when you read instructions, pamphlets, or other written materials from your doctor or pharmacy?  1 - Never    What is the last grade level you completed in school?  high school      Pre-Education Assessment   Patient understands the diabetes disease and treatment process.  Needs Instruction    Patient understands incorporating nutritional management into lifestyle.  Needs Instruction    Patient undertands incorporating physical activity into lifestyle.  Needs Instruction    Patient understands using medications safely.  Needs Instruction    Patient understands monitoring blood glucose, interpreting and using results  Needs Instruction    Patient understands prevention, detection, and treatment of acute  complications.  Needs Instruction    Patient understands prevention, detection, and treatment of chronic complications.  Needs Instruction    Patient understands how to develop strategies to address psychosocial issues.  Needs Instruction    Patient understands how to develop strategies to promote health/change behavior.  Needs Instruction      Complications   Last HgB A1C per patient/outside source  7 % 04/11/17    How often do you check your blood sugar?  0 times/day (not testing) Provided One Touch Verio Flex meter and instructed on use. BG upon return demonstration was 102 mg/dL at 8:65 pm - 5 hrs pp.     Have you had a dilated eye exam in the past 12 months?  No    Have you had a dental exam in the past 12 months?  No    Are you checking your feet?  Yes    How many days per week are you checking your feet?  7      Dietary Intake   Breakfast  oatmeal, boiled egg    Lunch  2 hamburger patties with no buns, chicken    Snack (afternoon)  nuts    Dinner  beef, chicken, occasional pork, fish with pinto beans peas, corn, asparagus, brussel sprouts, green beans, greens, broccoli    Beverage(s)  water, power aid, gatorade      Exercise  Exercise Type  Moderate (swimming / aerobic walking)    How many days per week to you exercise?  2    How many minutes per day do you exercise?  60    Total minutes per week of exercise  120      Patient Education   Previous Diabetes Education  No    Disease state   Definition of diabetes, type 1 and 2, and the diagnosis of diabetes    Nutrition management   Role of diet in the treatment of diabetes and the relationship between the three main macronutrients and blood glucose level;Reviewed blood glucose goals for pre and post meals and how to evaluate the patients' food intake on their blood glucose level.    Physical activity and exercise   Role of exercise on diabetes management, blood pressure control and cardiac health.    Medications  Reviewed patients  medication for diabetes, action, purpose, timing of dose and side effects.    Monitoring  Taught/evaluated SMBG meter.;Purpose and frequency of SMBG.;Taught/discussed recording of test results and interpretation of SMBG.;Identified appropriate SMBG and/or A1C goals.    Chronic complications  Relationship between chronic complications and blood glucose control    Psychosocial adjustment  Identified and addressed patients feelings and concerns about diabetes      Individualized Goals (developed by patient)   Reducing Risk  Improve blood sugars Lose weight Lead a healthier lifestyle Become more fit     Outcomes   Expected Outcomes  Demonstrated interest in learning. Expect positive outcomes    Future DMSE  4-6 wks       Individualized Plan for Diabetes Self-Management Training:   Learning Objective:  Patient will have a greater understanding of diabetes self-management. Patient education plan is to attend individual and/or group sessions per assessed needs and concerns.   Plan:   Patient Instructions  Check blood sugars 2 x day before breakfast and 2 hrs after one meal 3-4 x week  Bring blood sugar records to the next appointment Call your doctor for a prescription for:  1. Meter strips (type) One Touch Verio checking  3-4 times per week  2. Lancets (type) One Touch Delica checking  3-4  times per week  Exercise: Continue cardio  for   60  minutes   2  days a week and gradually increase to 150 minutes/week Eat 3 meals day,   1-2  snack a day Space meals 4-6 hours apart Don't skip meals Limit fried foods Avoid sugar sweetened drinks (sports drinks) Complete 3 Day Food Record and bring to next appt Make an eye doctor appointment Return for appointment on:  Friday July 15, 2017 at 1:15 pm with Pam (dietitian)  Expected Outcomes:  Demonstrated interest in learning. Expect positive outcomes  Education material provided:  General Meal Planning Guidelines Simple Meal Plan Meter =  One Touch Verio 3 Day Food Record  If problems or questions, patient to contact team via: Sharion Settler, RN, CCM, CDE [phone]  Future DSME appointment: 4-6 wks  July 15, 2017 with the dietitian. Pt works 2 jobs and different shifts. 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 He is not able to come to Diabetes classes but agreed to come to the 2 Hour Refresher Program.

## 2017-06-06 NOTE — Patient Instructions (Addendum)
Check blood sugars 2 x day before breakfast and 2 hrs after one meal 3-4 x week  Bring blood sugar records to the next appointment  Call your doctor for a prescription for:  1. Meter strips (type) One Touch Verio checking  3-4 times per week  2. Lancets (type) One Touch Delica checking  3-4  times per week  Exercise: Continue cardio  for   60  minutes   2  days a week and gradually increase to 150 minutes/week  Eat 3 meals day,   1-2  snack a day Space meals 4-6 hours apart Don't skip meals Limit fried foods Avoid sugar sweetened drinks (sports drinks)  Complete 3 Day Food Record and bring to next appt  Make an eye doctor appointment  Return for appointment on:  Friday July 15, 2017 at 1:15 pm with [facility] (dietitian)

## 2017-06-09 ENCOUNTER — Telehealth: Payer: Self-pay | Admitting: Internal Medicine

## 2017-06-09 NOTE — Telephone Encounter (Signed)
Copied from [place] [id number]. Topic: Quick Communication - Rx Refill/Question >> Jun 09, 2017 11:36 AM Clack, Laban Emperor wrote: Medication: test stripes and needles Has the patient contacted their pharmacy? No. Pt states his nutrition gave him a sample kit and was told to contact his PCP for refills. (Agent: If no, request that the patient contact the pharmacy for the refill.) Preferred Pharmacy (with phone number or street name): [id number] - [place], [place] - [place] [phone] (Phone) [phone] (Fax)  Agent: Please be advised that RX refills may take up to 3 business days. We ask that you follow-up with your pharmacy.

## 2017-06-09 NOTE — Telephone Encounter (Signed)
Is Dr. Shirlee Latch going to follow him for his diabetes, it doesn't look like she has prescribed these before.

## 2017-06-10 NOTE — Telephone Encounter (Signed)
Please advise 

## 2017-06-16 NOTE — Telephone Encounter (Signed)
What kind of machine does he have ? What supplies does he need again?  Ok to refill 1 year of strips and lancets   E11.9

## 2017-06-17 MED ORDER — GLUCOSE BLOOD VI STRP: ORAL_STRIP | 12 refills | Status: DC

## 2017-06-17 MED ORDER — ONETOUCH DELICA LANCETS 33G MISC: 1.0000 | Freq: Three times a day (TID) | 6 refills | Status: DC

## 2017-07-15 ENCOUNTER — Other Ambulatory Visit: Payer: Self-pay | Admitting: Internal Medicine

## 2017-07-15 ENCOUNTER — Ambulatory Visit: Payer: Self-pay | Admitting: Dietician

## 2017-07-15 DIAGNOSIS — E119 Type 2 diabetes mellitus without complications: Secondary | ICD-10-CM

## 2017-07-15 MED ORDER — EMPAGLIFLOZIN 10 MG PO TABS: 10.0000 mg | ORAL_TABLET | Freq: Every day | ORAL | 0 refills | Status: DC

## 2017-08-03 ENCOUNTER — Encounter: Payer: Self-pay | Admitting: Dietician

## 2017-08-03 NOTE — Progress Notes (Signed)
Patient cancelled his appointment for 08/05/17; this is the 2nd consecutively cancelled appointment. A message has been left for him to call back to reschedule.

## 2017-08-05 ENCOUNTER — Ambulatory Visit: Payer: Self-pay | Admitting: Dietician

## 2017-08-17 ENCOUNTER — Encounter: Payer: Managed Care, Other (non HMO) | Attending: Internal Medicine | Admitting: Dietician

## 2017-08-17 ENCOUNTER — Encounter: Payer: Self-pay | Admitting: Dietician

## 2017-08-17 VITALS — BP 120/86 | Ht 73.0 in | Wt >= 6400 oz

## 2017-08-17 DIAGNOSIS — Z713 Dietary counseling and surveillance: Secondary | ICD-10-CM | POA: Diagnosis not present

## 2017-08-17 DIAGNOSIS — E119 Type 2 diabetes mellitus without complications: Secondary | ICD-10-CM | POA: Diagnosis not present

## 2017-08-17 NOTE — Patient Instructions (Signed)
   Continue to control carb intake, but do allow for about 3-4 servings, or 45-60 grams with meals to ensure overall good nutrition and enough fiber.   Keep working to increase physical activity and exercise as able.   Follow an eating pattern that allows you to eat every 4-6 hours, and to keep a healthy balance of carb, protein and fat that you can maintain as a lifestyle. Avoid overly strict diet habits.

## 2017-08-17 NOTE — Progress Notes (Signed)
Diabetes Self-Management Education  Visit Type:  Follow-up  Appt. Start Time: 1405 Appt. End Time: 1500  08/17/2017  Rickey Payne, identified by name and date of birth, is a 27 y.o. male with a diagnosis of Diabetes:  .   ASSESSMENT  Blood pressure 120/86, height 6\' 1"  (1.854 m), weight (!) 421 lb 4.8 oz (191.1 kg). Body mass index is 55.58 kg/m.   Diabetes Self-Management Education - 08/17/17 1416      Complications   How often do you check your blood sugar?  1-2 times/day    Fasting Blood glucose range (mg/dL)  16-109;[id number];130-179    Postprandial Blood glucose range (mg/dL)  [id number] -956O140s average per patient report    Number of hypoglycemic episodes per month  -- lowest reading was 79 when having symptoms    Have you had a dilated eye exam in the past 12 months?  No    Have you had a dental exam in the past 12 months?  No    Are you checking your feet?  Yes    How many days per week are you checking your feet?  7      Dietary Intake   Breakfast  2-3 meals (fewer eggs, has slightly decreased protein intake due to constipation) and 0-1 snacks daily.     Beverage(s)  water, Powerade zero or Gatorade zero      Exercise   Exercise Type  Moderate (swimming / aerobic walking)    How many days per week to you exercise?  2    How many minutes per day do you exercise?  60    Total minutes per week of exercise  120      Patient Education   Nutrition management   Role of diet in the treatment of diabetes and the relationship between the three main macronutrients and blood glucose level;Food label reading, portion sizes and measuring food.;Meal timing in regards to the patients' current diabetes medication.;Meal options for control of blood glucose level and chronic complications.;Other (comment) basic meal planning for weight loss (1800kcal)    Physical activity and exercise   Role of exercise on diabetes management, blood pressure control and cardiac health.    Monitoring   Taught/discussed recording of test results and interpretation of SMBG.    Acute complications  Taught treatment of hypoglycemia - the 15 rule.    Chronic complications  Relationship between chronic complications and blood glucose control      Post-Education Assessment   Patient understands the diabetes disease and treatment process.  Demonstrates understanding / competency    Patient understands incorporating nutritional management into lifestyle.  Demonstrates understanding / competency    Patient undertands incorporating physical activity into lifestyle.  Demonstrates understanding / competency    Patient understands using medications safely.  Demonstrates understanding / competency    Patient understands monitoring blood glucose, interpreting and using results  Demonstrates understanding / competency    Patient understands prevention, detection, and treatment of acute complications.  Demonstrates understanding / competency    Patient understands prevention, detection, and treatment of chronic complications.  Demonstrates understanding / competency    Patient understands how to develop strategies to address psychosocial issues.  Demonstrates understanding / competency    Patient understands how to develop strategies to promote health/change behavior.  Demonstrates understanding / competency      Outcomes   Program Status  Completed       Learning Objective:  Patient will have a greater understanding of  diabetes self-management. Patient education plan is to attend individual and/or group sessions per assessed needs and concerns.  Patient is working diligently on weight loss through diet and exercise. He has hat setbacks in recent weeks due to 2 bouts of strep throat and caring for brother after eye surgery. He is resuming exercise and is carefully controlling caloric intake. Most meals are low in carbohydrate; reviewed appropriate carb choices and balance, and provided meal plan for 1800kcal  with 40%CHO, 30% protein, and 30% fat.   Plan:   Patient Instructions   Continue to control carb intake, but do allow for about 3-4 servings, or 45-60 grams with meals to ensure overall good nutrition and enough fiber.   Keep working to increase physical activity and exercise as able.   Follow an eating pattern that allows you to eat every 4-6 hours, and to keep a healthy balance of carb, protein and fat that you can maintain as a lifestyle. Avoid overly strict diet habits.     Expected Outcomes:  Demonstrated interest in learning. Expect positive outcomes  Education material provided: Planning A Balanced Meal; Quick and Healthy Meal Ideas  If problems or questions, patient to contact team via:  Phone and Email

## 2017-08-19 ENCOUNTER — Encounter: Payer: Self-pay | Admitting: Internal Medicine

## 2017-08-19 ENCOUNTER — Ambulatory Visit (INDEPENDENT_AMBULATORY_CARE_PROVIDER_SITE_OTHER): Payer: Managed Care, Other (non HMO) | Admitting: Internal Medicine

## 2017-08-19 VITALS — BP 136/94 | HR 91 | Temp 98.0°F | Ht 73.0 in | Wt >= 6400 oz

## 2017-08-19 DIAGNOSIS — E559 Vitamin D deficiency, unspecified: Secondary | ICD-10-CM

## 2017-08-19 DIAGNOSIS — E119 Type 2 diabetes mellitus without complications: Secondary | ICD-10-CM | POA: Diagnosis not present

## 2017-08-19 DIAGNOSIS — L730 Acne keloid: Secondary | ICD-10-CM | POA: Diagnosis not present

## 2017-08-19 DIAGNOSIS — I10 Essential (primary) hypertension: Secondary | ICD-10-CM

## 2017-08-19 LAB — BASIC METABOLIC PANEL
BUN: 13 mg/dL (ref 6–23)
CHLORIDE: 101 meq/L (ref 96–112)
CO2: 29 mEq/L (ref 19–32)
Calcium: 9 mg/dL (ref 8.4–10.5)
Creatinine, Ser: 0.64 mg/dL (ref 0.40–1.50)
GFR: 192.25 mL/min (ref >60.00)
Glucose, Bld: 135 mg/dL — ABNORMAL HIGH (ref 70–99)
POTASSIUM: 4.3 meq/L (ref 3.5–5.1)
SODIUM: 138 meq/L (ref 135–145)

## 2017-08-19 LAB — HEMOGLOBIN A1C: HEMOGLOBIN A1C: 7.1 % — AB (ref 4.6–6.5)

## 2017-08-19 MED ORDER — CLOBETASOL PROPIONATE 0.05 % EX SOLN: 1.0000 "application " | Freq: Two times a day (BID) | CUTANEOUS | 11 refills | Status: DC

## 2017-08-19 MED ORDER — LOSARTAN POTASSIUM 50 MG PO TABS: 50.0000 mg | ORAL_TABLET | Freq: Every day | ORAL | 1 refills | Status: DC

## 2017-08-19 MED ORDER — EMPAGLIFLOZIN 10 MG PO TABS: 10.0000 mg | ORAL_TABLET | Freq: Every day | ORAL | 1 refills | Status: DC

## 2017-08-19 NOTE — Progress Notes (Signed)
Pre visit review using our clinic review tool, if applicable. No additional management support is needed unless otherwise documented below in the visit note. 

## 2017-08-19 NOTE — Patient Instructions (Addendum)
Pick up vitamin D Also let me know when you have eye insurance so I can do a referral  Try [facility] at [employer] cleansing bar or Benzoyl peroxide  To face  Think about pneumonia 23 vaccine  F/u in 4 months sooer if needed    Mediterranean Diet A Mediterranean diet refers to food and lifestyle choices that are based on the traditions of countries located on the [place]. This way of eating has been shown to help prevent certain conditions and improve outcomes for people who have chronic diseases, like kidney disease and heart disease. What are tips for following this plan? Lifestyle  Cook and eat meals together with your family, when possible.  Drink enough fluid to keep your urine clear or pale yellow.  Be physically active every day. This includes: ? Aerobic exercise like running or swimming. ? Leisure activities like gardening, walking, or housework.  Get 7-8 hours of sleep each night.  If recommended by your health care provider, drink red wine in moderation. This means 1 glass a day for nonpregnant women and 2 glasses a day for men. A glass of wine equals 5 oz (150 mL). Reading food labels  Check the serving size of packaged foods. For foods such as rice and pasta, the serving size refers to the amount of cooked product, not dry.  Check the total fat in packaged foods. Avoid foods that have saturated fat or trans fats.  Check the ingredients list for added sugars, such as corn syrup. Shopping  At the grocery store, buy most of your food from the areas near the walls of the store. This includes: ? Fresh fruits and vegetables (produce). ? Grains, beans, nuts, and seeds. Some of these may be available in unpackaged forms or large amounts (in bulk). ? Fresh seafood. ? Poultry and eggs. ? Low-fat dairy products.  Buy whole ingredients instead of prepackaged foods.  Buy fresh fruits and vegetables in-season from local farmers markets.  Buy frozen fruits and  vegetables in resealable bags.  If you do not have access to quality fresh seafood, buy precooked frozen shrimp or canned fish, such as tuna, salmon, or sardines.  Buy small amounts of raw or cooked vegetables, salads, or olives from the deli or salad bar at your store.  Stock your pantry so you always have certain foods on hand, such as olive oil, canned tuna, canned tomatoes, rice, pasta, and beans. Cooking  Cook foods with extra-virgin olive oil instead of using butter or other vegetable oils.  Have meat as a side dish, and have vegetables or grains as your main dish. This means having meat in small portions or adding small amounts of meat to foods like pasta or stew.  Use beans or vegetables instead of meat in common dishes like chili or lasagna.  Experiment with different cooking methods. Try roasting or broiling vegetables instead of steaming or sauteing them.  Add frozen vegetables to soups, stews, pasta, or rice.  Add nuts or seeds for added healthy fat at each meal. You can add these to yogurt, salads, or vegetable dishes.  Marinate fish or vegetables using olive oil, lemon juice, garlic, and fresh herbs. Meal planning  Plan to eat 1 vegetarian meal one day each week. Try to work up to 2 vegetarian meals, if possible.  Eat seafood 2 or more times a week.  Have healthy snacks readily available, such as: ? Vegetable sticks with hummus. ? [place] yogurt. ? Fruit and nut trail  mix.  Eat balanced meals throughout the week. This includes: ? Fruit: 2-3 servings a day ? Vegetables: 4-5 servings a day ? Low-fat dairy: 2 servings a day ? Fish, poultry, or lean meat: 1 serving a day ? Beans and legumes: 2 or more servings a week ? Nuts and seeds: 1-2 servings a day ? Whole grains: 6-8 servings a day ? Extra-virgin olive oil: 3-4 servings a day  Limit red meat and sweets to only a few servings a month What are my food choices?  Mediterranean diet ? Recommended ? Grains:  Whole-grain pasta. Brown rice. Bulgar wheat. Polenta. Couscous. Whole-wheat bread. Orpah Cobb. ? Vegetables: Artichokes. Beets. Broccoli. Cabbage. Carrots. Eggplant. Green beans. Chard. Kale. Spinach. Onions. Leeks. Peas. Squash. Tomatoes. Peppers. Radishes. ? Fruits: Apples. Apricots. Avocado. Berries. Bananas. Cherries. Dates. Figs. Grapes. Lemons. Melon. Oranges. Peaches. Plums. Pomegranate. ? Meats and other protein foods: Beans. Almonds. Sunflower seeds. Pine nuts. Peanuts. Cod. Salmon. Scallops. Shrimp. Tuna. Tilapia. Clams. Oysters. Eggs. ? Dairy: Low-fat milk. Cheese. Greek yogurt. ? Beverages: Water. Red wine. Herbal tea. ? Fats and oils: Extra virgin olive oil. Avocado oil. Grape seed oil. ? Sweets and desserts: [place] yogurt with honey. Baked apples. Poached pears. Trail mix. ? Seasoning and other foods: Basil. Cilantro. Coriander. Cumin. Mint. Parsley. Sage. Rosemary. Tarragon. Garlic. Oregano. Thyme. Pepper. Balsalmic vinegar. Tahini. Hummus. Tomato sauce. Olives. Mushrooms. ? Limit these ? Grains: Prepackaged pasta or rice dishes. Prepackaged cereal with added sugar. ? Vegetables: Deep fried potatoes (french fries). ? Fruits: Fruit canned in syrup. ? Meats and other protein foods: Beef. Pork. Lamb. Poultry with skin. Hot dogs. Tomasa Blase. ? Dairy: Ice cream. Sour cream. Whole milk. ? Beverages: Juice. Sugar-sweetened soft drinks. Beer. Liquor and spirits. ? Fats and oils: Butter. Canola oil. Vegetable oil. Beef fat (tallow). Lard. ? Sweets and desserts: Cookies. Cakes. Pies. Candy. ? Seasoning and other foods: Mayonnaise. Premade sauces and marinades. ? The items listed may not be a complete list. Talk with your dietitian about what dietary choices are right for you. Summary  The Mediterranean diet includes both food and lifestyle choices.  Eat a variety of fresh fruits and vegetables, beans, nuts, seeds, and whole grains.  Limit the amount of red meat and sweets that you  eat.  Talk with your health care provider about whether it is safe for you to drink red wine in moderation. This means 1 glass a day for nonpregnant women and 2 glasses a day for men. A glass of wine equals 5 oz (150 mL). This information is not intended to replace advice given to you by your health care provider. Make sure you discuss any questions you have with your health care provider. Document Released: 09/18/2015 Document Revised: 10/21/2015 Document Reviewed: 09/18/2015 Elsevier Interactive Patient Education  2018 [employer].   DASH Eating Plan DASH stands for "Dietary Approaches to Stop Hypertension." The DASH eating plan is a healthy eating plan that has been shown to reduce high blood pressure (hypertension). It may also reduce your risk for type 2 diabetes, heart disease, and stroke. The DASH eating plan may also help with weight loss. What are tips for following this plan? General guidelines  Avoid eating more than 2,300 mg (milligrams) of salt (sodium) a day. If you have hypertension, you may need to reduce your sodium intake to 1,500 mg a day.  Limit alcohol intake to no more than 1 drink a day for nonpregnant women and 2 drinks a day for men. One drink equals  12 oz of beer, 5 oz of wine, or 1 oz of hard liquor.  Work with your health care provider to maintain a healthy body weight or to lose weight. Ask what an ideal weight is for you.  Get at least 30 minutes of exercise that causes your heart to beat faster (aerobic exercise) most days of the week. Activities may include walking, swimming, or biking.  Work with your health care provider or diet and nutrition specialist (dietitian) to adjust your eating plan to your individual calorie needs. Reading food labels  Check food labels for the amount of sodium per serving. Choose foods with less than 5 percent of the Daily Value of sodium. Generally, foods with less than 300 mg of sodium per serving fit into this eating  plan.  To find whole grains, look for the word "whole" as the first word in the ingredient list. Shopping  Buy products labeled as "low-sodium" or "no salt added."  Buy fresh foods. Avoid canned foods and premade or frozen meals. Cooking  Avoid adding salt when cooking. Use salt-free seasonings or herbs instead of table salt or sea salt. Check with your health care provider or pharmacist before using salt substitutes.  Do not fry foods. Cook foods using healthy methods such as baking, boiling, grilling, and broiling instead.  Cook with heart-healthy oils, such as olive, canola, soybean, or sunflower oil. Meal planning   Eat a balanced diet that includes: ? 5 or more servings of fruits and vegetables each day. At each meal, try to fill half of your plate with fruits and vegetables. ? Up to 6-8 servings of whole grains each day. ? Less than 6 oz of lean meat, poultry, or fish each day. A 3-oz serving of meat is about the same size as a deck of cards. One egg equals 1 oz. ? 2 servings of low-fat dairy each day. ? A serving of nuts, seeds, or beans 5 times each week. ? Heart-healthy fats. Healthy fats called Omega-3 fatty acids are found in foods such as flaxseeds and coldwater fish, like sardines, salmon, and mackerel.  Limit how much you eat of the following: ? Canned or prepackaged foods. ? Food that is high in trans fat, such as fried foods. ? Food that is high in saturated fat, such as fatty meat. ? Sweets, desserts, sugary drinks, and other foods with added sugar. ? Full-fat dairy products.  Do not salt foods before eating.  Try to eat at least 2 vegetarian meals each week.  Eat more home-cooked food and less restaurant, buffet, and fast food.  When eating at a restaurant, ask that your food be prepared with less salt or no salt, if possible. What foods are recommended? The items listed may not be a complete list. Talk with your dietitian about what dietary choices are best  for you. Grains Whole-grain or whole-wheat bread. Whole-grain or whole-wheat pasta. Brown rice. Orpah Cobb. Bulgur. Whole-grain and low-sodium cereals. Pita bread. Low-fat, low-sodium crackers. Whole-wheat flour tortillas. Vegetables Fresh or frozen vegetables (raw, steamed, roasted, or grilled). Low-sodium or reduced-sodium tomato and vegetable juice. Low-sodium or reduced-sodium tomato sauce and tomato paste. Low-sodium or reduced-sodium canned vegetables. Fruits All fresh, dried, or frozen fruit. Canned fruit in natural juice (without added sugar). Meat and other protein foods Skinless chicken or [place]. Ground chicken or [place]. Pork with fat trimmed off. Fish and seafood. Egg whites. Dried beans, peas, or lentils. Unsalted nuts, nut butters, and seeds. Unsalted canned beans. Lean cuts of  beef with fat trimmed off. Low-sodium, lean deli meat. Dairy Low-fat (1%) or fat-free (skim) milk. Fat-free, low-fat, or reduced-fat cheeses. Nonfat, low-sodium ricotta or cottage cheese. Low-fat or nonfat yogurt. Low-fat, low-sodium cheese. Fats and oils Soft margarine without trans fats. Vegetable oil. Low-fat, reduced-fat, or light mayonnaise and salad dressings (reduced-sodium). Canola, safflower, olive, soybean, and sunflower oils. Avocado. Seasoning and other foods Herbs. Spices. Seasoning mixes without salt. Unsalted popcorn and pretzels. Fat-free sweets. What foods are not recommended? The items listed may not be a complete list. Talk with your dietitian about what dietary choices are best for you. Grains Baked goods made with fat, such as croissants, muffins, or some breads. Dry pasta or rice meal packs. Vegetables Creamed or fried vegetables. Vegetables in a cheese sauce. Regular canned vegetables (not low-sodium or reduced-sodium). Regular canned tomato sauce and paste (not low-sodium or reduced-sodium). Regular tomato and vegetable juice (not low-sodium or reduced-sodium). Rosita Fire.  Olives. Fruits Canned fruit in a light or heavy syrup. Fried fruit. Fruit in cream or butter sauce. Meat and other protein foods Fatty cuts of meat. Ribs. Fried meat. Tomasa Blase. Sausage. Bologna and other processed lunch meats. Salami. Fatback. Hotdogs. Bratwurst. Salted nuts and seeds. Canned beans with added salt. Canned or smoked fish. Whole eggs or egg yolks. Chicken or [place] with skin. Dairy Whole or 2% milk, cream, and half-and-half. Whole or full-fat cream cheese. Whole-fat or sweetened yogurt. Full-fat cheese. Nondairy creamers. Whipped toppings. Processed cheese and cheese spreads. Fats and oils Butter. Stick margarine. Lard. Shortening. Ghee. Bacon fat. Tropical oils, such as coconut, palm kernel, or palm oil. Seasoning and other foods Salted popcorn and pretzels. Onion salt, garlic salt, seasoned salt, table salt, and sea salt. Worcestershire sauce. Tartar sauce. Barbecue sauce. Teriyaki sauce. Soy sauce, including reduced-sodium. Steak sauce. Canned and packaged gravies. Fish sauce. Oyster sauce. Cocktail sauce. Horseradish that you find on the shelf. Ketchup. Mustard. Meat flavorings and tenderizers. Bouillon cubes. Hot sauce and Tabasco sauce. Premade or packaged marinades. Premade or packaged taco seasonings. Relishes. Regular salad dressings. Where to find more information:  National Heart, Lung, and Blood Institute: PopSteam.is  American Heart Association: www.heart.org Summary  The DASH eating plan is a healthy eating plan that has been shown to reduce high blood pressure (hypertension). It may also reduce your risk for type 2 diabetes, heart disease, and stroke.  With the DASH eating plan, you should limit salt (sodium) intake to 2,300 mg a day. If you have hypertension, you may need to reduce your sodium intake to 1,500 mg a day.  When on the DASH eating plan, aim to eat more fresh fruits and vegetables, whole grains, lean proteins, low-fat dairy, and heart-healthy  fats.  Work with your health care provider or diet and nutrition specialist (dietitian) to adjust your eating plan to your individual calorie needs. This information is not intended to replace advice given to you by your health care provider. Make sure you discuss any questions you have with your health care provider. Document Released: 01/14/2011 Document Revised: 01/19/2016 Document Reviewed: 01/19/2016 Elsevier Interactive Patient Education  2018 [employer].   Hypertension Hypertension, commonly called high blood pressure, is when the force of blood pumping through the arteries is too strong. The arteries are the blood vessels that carry blood from the heart throughout the body. Hypertension forces the heart to work harder to pump blood and may cause arteries to become narrow or stiff. Having untreated or uncontrolled hypertension can cause heart attacks, strokes, kidney disease, and  other problems. A blood pressure reading consists of a higher number over a lower number. Ideally, your blood pressure should be below 120/80. The first ("top") number is called the systolic pressure. It is a measure of the pressure in your arteries as your heart beats. The second ("bottom") number is called the diastolic pressure. It is a measure of the pressure in your arteries as the heart relaxes. What are the causes? The cause of this condition is not known. What increases the risk? Some risk factors for high blood pressure are under your control. Others are not. Factors you can change  Smoking.  Having type 2 diabetes mellitus, high cholesterol, or both.  Not getting enough exercise or physical activity.  Being overweight.  Having too much fat, sugar, calories, or salt (sodium) in your diet.  Drinking too much alcohol. Factors that are difficult or impossible to change  Having chronic kidney disease.  Having a family history of high blood pressure.  Age. Risk increases with age.  Race. You  may be at higher risk if you are African-American.  Gender. Men are at higher risk than women before age 32. After age 87, women are at higher risk than men.  Having obstructive sleep apnea.  Stress. What are the signs or symptoms? Extremely high blood pressure (hypertensive crisis) may cause:  Headache.  Anxiety.  Shortness of breath.  Nosebleed.  Nausea and vomiting.  Severe chest pain.  Jerky movements you cannot control (seizures).  How is this diagnosed? This condition is diagnosed by measuring your blood pressure while you are seated, with your arm resting on a surface. The cuff of the blood pressure monitor will be placed directly against the skin of your upper arm at the level of your heart. It should be measured at least twice using the same arm. Certain conditions can cause a difference in blood pressure between your right and left arms. Certain factors can cause blood pressure readings to be lower or higher than normal (elevated) for a short period of time:  When your blood pressure is higher when you are in a health care provider's office than when you are at home, this is called white coat hypertension. Most people with this condition do not need medicines.  When your blood pressure is higher at home than when you are in a health care provider's office, this is called masked hypertension. Most people with this condition may need medicines to control blood pressure.  If you have a high blood pressure reading during one visit or you have normal blood pressure with other risk factors:  You may be asked to return on a different day to have your blood pressure checked again.  You may be asked to monitor your blood pressure at home for 1 week or longer.  If you are diagnosed with hypertension, you may have other blood or imaging tests to help your health care provider understand your overall risk for other conditions. How is this treated? This condition is treated by  making healthy lifestyle changes, such as eating healthy foods, exercising more, and reducing your alcohol intake. Your health care provider may prescribe medicine if lifestyle changes are not enough to get your blood pressure under control, and if:  Your systolic blood pressure is above 130.  Your diastolic blood pressure is above 80.  Your personal target blood pressure may vary depending on your medical conditions, your age, and other factors. Follow these instructions at home: Eating and drinking  Eat a  diet that is high in fiber and potassium, and low in sodium, added sugar, and fat. An example eating plan is called the DASH (Dietary Approaches to Stop Hypertension) diet. To eat this way: ? Eat plenty of fresh fruits and vegetables. Try to fill half of your plate at each meal with fruits and vegetables. ? Eat whole grains, such as whole wheat pasta, brown rice, or whole grain bread. Fill about one quarter of your plate with whole grains. ? Eat or drink low-fat dairy products, such as skim milk or low-fat yogurt. ? Avoid fatty cuts of meat, processed or cured meats, and poultry with skin. Fill about one quarter of your plate with lean proteins, such as fish, chicken without skin, beans, eggs, and tofu. ? Avoid premade and processed foods. These tend to be higher in sodium, added sugar, and fat.  Reduce your daily sodium intake. Most people with hypertension should eat less than 1,500 mg of sodium a day.  Limit alcohol intake to no more than 1 drink a day for nonpregnant women and 2 drinks a day for men. One drink equals 12 oz of beer, 5 oz of wine, or 1 oz of hard liquor. Lifestyle  Work with your health care provider to maintain a healthy body weight or to lose weight. Ask what an ideal weight is for you.  Get at least 30 minutes of exercise that causes your heart to beat faster (aerobic exercise) most days of the week. Activities may include walking, swimming, or biking.  Include  exercise to strengthen your muscles (resistance exercise), such as pilates or lifting weights, as part of your weekly exercise routine. Try to do these types of exercises for 30 minutes at least 3 days a week.  Do not use any products that contain nicotine or tobacco, such as cigarettes and e-cigarettes. If you need help quitting, ask your health care provider.  Monitor your blood pressure at home as told by your health care provider.  Keep all follow-up visits as told by your health care provider. This is important. Medicines  Take over-the-counter and prescription medicines only as told by your health care provider. Follow directions carefully. Blood pressure medicines must be taken as prescribed.  Do not skip doses of blood pressure medicine. Doing this puts you at risk for problems and can make the medicine less effective.  Ask your health care provider about side effects or reactions to medicines that you should watch for. Contact a health care provider if:  You think you are having a reaction to a medicine you are taking.  You have headaches that keep coming back (recurring).  You feel dizzy.  You have swelling in your ankles.  You have trouble with your vision. Get help right away if:  You develop a severe headache or confusion.  You have unusual weakness or numbness.  You feel faint.  You have severe pain in your chest or abdomen.  You vomit repeatedly.  You have trouble breathing. Summary  Hypertension is when the force of blood pumping through your arteries is too strong. If this condition is not controlled, it may put you at risk for serious complications.  Your personal target blood pressure may vary depending on your medical conditions, your age, and other factors. For most people, a normal blood pressure is less than 120/80.  Hypertension is treated with lifestyle changes, medicines, or a combination of both. Lifestyle changes include weight loss, eating a  healthy, low-sodium diet, exercising more, and limiting  alcohol. This information is not intended to replace advice given to you by your health care provider. Make sure you discuss any questions you have with your health care provider. Document Released: 01/25/2005 Document Revised: 12/24/2015 Document Reviewed: 12/24/2015 Elsevier Interactive Patient Education  2018 [employer]    Pneumococcal Polysaccharide Vaccine: What You Need to Know 1. Why get vaccinated? Vaccination can protect older adults (and some children and younger adults) from pneumococcal disease. Pneumococcal disease is caused by bacteria that can spread from person to person through close contact. It can cause ear infections, and it can also lead to more serious infections of the:  Lungs (pneumonia),  Blood (bacteremia), and  Covering of the brain and spinal cord (meningitis). Meningitis can cause deafness and brain damage, and it can be fatal.  Anyone can get pneumococcal disease, but children under 13 years of age, people with certain medical conditions, adults over 50 years of age, and cigarette smokers are at the highest risk. About 18,000 older adults die each year from pneumococcal disease in the [place]. Treatment of pneumococcal infections with penicillin and other drugs used to be more effective. But some strains of the disease have become resistant to these drugs. This makes prevention of the disease, through vaccination, even more important. 2. Pneumococcal polysaccharide vaccine (PPSV23) Pneumococcal polysaccharide vaccine (PPSV23) protects against 23 types of pneumococcal bacteria. It will not prevent all pneumococcal disease. PPSV23 is recommended for:  All adults 65 years of age and older,  Anyone 2 through 27 years of age with certain long-term health problems,  Anyone 2 through 27 years of age with a weakened immune system,  Adults 71 through 27 years of age who smoke cigarettes or have  asthma.  Most people need only one dose of PPSV. A second dose is recommended for certain high-risk groups. People 70 and older should get a dose even if they have gotten one or more doses of the vaccine before they turned 65. Your healthcare provider can give you more information about these recommendations. Most healthy adults develop protection within 2 to 3 weeks of getting the shot. 3. Some people should not get this vaccine  Anyone who has had a life-threatening allergic reaction to PPSV should not get another dose.  Anyone who has a severe allergy to any component of PPSV should not receive it. Tell your provider if you have any severe allergies.  Anyone who is moderately or severely ill when the shot is scheduled may be asked to wait until they recover before getting the vaccine. Someone with a mild illness can usually be vaccinated.  Children less than 34 years of age should not receive this vaccine.  There is no evidence that PPSV is harmful to either a pregnant woman or to her fetus. However, as a precaution, women who need the vaccine should be vaccinated before becoming pregnant, if possible. 4. Risks of a vaccine reaction With any medicine, including vaccines, there is a chance of side effects. These are usually mild and go away on their own, but serious reactions are also possible. About half of people who get PPSV have mild side effects, such as redness or pain where the shot is given, which go away within about two days. Less than 1 out of 100 people develop a fever, muscle aches, or more severe local reactions. Problems that could happen after any vaccine:  People sometimes faint after a medical procedure, including vaccination. Sitting or lying down for about 15 minutes can help  prevent fainting, and injuries caused by a fall. Tell your doctor if you feel dizzy, or have vision changes or ringing in the ears.  Some people get severe pain in the shoulder and have difficulty  moving the arm where a shot was given. This happens very rarely.  Any medication can cause a severe allergic reaction. Such reactions from a vaccine are very rare, estimated at about 1 in a million doses, and would happen within a few minutes to a few hours after the vaccination. As with any medicine, there is a very remote chance of a vaccine causing a serious injury or death. The safety of vaccines is always being monitored. For more information, visit: http://floyd.org/ 5. What if there is a serious reaction? What should I look for? Look for anything that concerns you, such as signs of a severe allergic reaction, very high fever, or unusual behavior. Signs of a severe allergic reaction can include hives, swelling of the face and throat, difficulty breathing, a fast heartbeat, dizziness, and weakness. These would usually start a few minutes to a few hours after the vaccination. What should I do? If you think it is a severe allergic reaction or other emergency that can't wait, call 9-1-1 or get to the nearest hospital. Otherwise, call your doctor. Afterward, the reaction should be reported to the Vaccine Adverse Event Reporting System (VAERS). Your doctor might file this report, or you can do it yourself through the VAERS web site at www.vaers.LAgents.no, or by calling 1-515-366-5944. VAERS does not give medical advice. 6. How can I learn more?  Ask your doctor. He or she can give you the vaccine package insert or suggest other sources of information.  Call your local or state health department.  Contact the Centers for Disease Control and Prevention (CDC): ? Call 5616382521 (1-800-CDC-INFO) or ? Visit CDC's website at PicCapture.uy CDC Pneumococcal Polysaccharide Vaccine VIS (06/01/13) This information is not intended to replace advice given to you by your health care provider. Make sure you discuss any questions you have with your health care provider. Document Released:  11/22/2005 Document Revised: 10/16/2015 Document Reviewed: 10/16/2015 Elsevier Interactive Patient Education  2017 [employer].

## 2017-08-19 NOTE — Progress Notes (Addendum)
Chief Complaint  Patient presents with  . Follow-up   F/u  1. HTN BP still elevated losartan 25 mg qd  2. DM 2 on Jardiance 10 mg tolerating check labs today  3. Uvula mass and enlarged tonsils will get tonsils out 09/22/17   Review of Systems  Constitutional: Positive for weight loss.  HENT: Negative for hearing loss.   Eyes: Negative for blurred vision.  Respiratory: Negative for shortness of breath.   Cardiovascular: Negative for chest pain.  Genitourinary: Negative for dysuria.  Skin: Negative for rash.       Acne in beard    Past Medical History:  Diagnosis Date  . Asthma    child  . Diabetes mellitus without complication ([place])   . Hyperlipidemia   . Hypertension    Past Surgical History:  Procedure Laterality Date  . right wrist fracture     Family History  Problem Relation Age of Onset  . Hypertension Mother   . Arthritis Mother   . Asthma Mother   . Depression Mother   . Hyperlipidemia Mother   . Diabetes Mother   . Hypertension Father   . Depression Father   . Hyperlipidemia Father   . Intellectual disability Father   . Stroke Father   . Heart disease Maternal Grandfather   . Diabetes Paternal Grandmother   . Drug abuse Paternal Grandmother   . Diabetes Maternal Grandmother    Social History   Socioeconomic History  . Marital status: Married    Spouse name: Not on file  . Number of children: Not on file  . Years of education: Not on file  . Highest education level: Not on file  Occupational History  . Not on file  Social Needs  . Financial resource strain: Not on file  . Food insecurity:    Worry: Not on file    Inability: Not on file  . Transportation needs:    Medical: Not on file    Non-medical: Not on file  Tobacco Use  . Smoking status: Never Smoker  . Smokeless tobacco: Never Used  Substance and Sexual Activity  . Alcohol use: Yes    Alcohol/week: 0.6 oz    Types: 1 Cans of beer per week    Comment: occasional  . Drug use: No   . Sexual activity: Yes  Lifestyle  . Physical activity:    Days per week: Not on file    Minutes per session: Not on file  . Stress: Not on file  Relationships  . Social connections:    Talks on phone: Not on file    Gets together: Not on file    Attends religious service: Not on file    Active member of club or organization: Not on file    Attends meetings of clubs or organizations: Not on file    Relationship status: Not on file  . Intimate partner violence:    Fear of current or ex partner: Not on file    Emotionally abused: Not on file    Physically abused: Not on file    Forced sexual activity: Not on file  Other Topics Concern  . Not on file  Social History Narrative   Diploma, Pharmacologist    Married    Drinks occasionally    Never smoker, no chew    Owns guns, wears seat belt, safe in relationship       Current Meds  Medication Sig  . atorvastatin (LIPITOR) 10 MG tablet  Take 1 tablet (10 mg total) by mouth daily.  . Cholecalciferol 50000 units capsule Take 1 capsule (50,000 Units total) by mouth once a week.  . clindamycin (CLEOCIN T) 1 % external solution Apply topically 2 (two) times daily.  . clobetasol (TEMOVATE) 0.05 % external solution Apply 1 application topically 2 (two) times daily. Bid prn  . empagliflozin (JARDIANCE) 10 MG TABS tablet Take 10 mg by mouth daily.  Marland Kitchen glucose blood test strip Use as instructed  . loratadine (CLARITIN) 10 MG tablet Take 10 mg by mouth daily.  Marland Kitchen losartan (COZAAR) 50 MG tablet Take 1 tablet (50 mg total) by mouth daily.  Glory Rosebush DELICA LANCETS 56Y MISC 1 Stick by Does not apply route 3 (three) times daily.  . [DISCONTINUED] clobetasol (TEMOVATE) 0.05 % external solution Apply 1 application topically 2 (two) times daily.  . [DISCONTINUED] empagliflozin (JARDIANCE) 10 MG TABS tablet Take 10 mg by mouth daily.  . [DISCONTINUED] losartan (COZAAR) 25 MG tablet Take 1 tablet (25 mg total) by mouth daily.   No Known  Allergies No results found for this or any previous visit (from the past 2160 hour(s)). Objective  Body mass index is 56.42 kg/m. Wt Readings from Last 3 Encounters:  08/19/17 (!) 427 lb 9.6 oz (194 kg)  08/17/17 (!) 421 lb 4.8 oz (191.1 kg)  06/06/17 (!) 421 lb 3.2 oz (191.1 kg)   Temp Readings from Last 3 Encounters:  08/19/17 98 F (36.7 C) (Oral)  05/20/17 98.3 F (36.8 C) (Oral)  03/03/17 98.5 F (36.9 C) (Oral)   BP Readings from Last 3 Encounters:  08/19/17 (!) 136/94  08/17/17 120/86  06/06/17 132/80   Pulse Readings from Last 3 Encounters:  08/19/17 91  05/20/17 90  05/16/17 84    Physical Exam  Constitutional: He is oriented to person, place, and time. He appears well-developed and well-nourished. He is cooperative.  HENT:  Head: Normocephalic and atraumatic.  Mouth/Throat: Oropharynx is clear and moist and mucous membranes are normal.  Eyes: Pupils are equal, round, and reactive to light. Conjunctivae are normal.  Cardiovascular: Normal rate, regular rhythm and normal heart sounds.  Pulses:      Dorsalis pedis pulses are 2+ on the right side, and 2+ on the left side.       Posterior tibial pulses are 2+ on the right side, and 2+ on the left side.  Pulmonary/Chest: Effort normal and breath sounds normal.  Musculoskeletal:       Right foot: There is normal range of motion and no deformity.       Left foot: There is normal range of motion and no deformity.  Feet:  Right Foot:  Skin Integrity: Positive for blister. Negative for ulcer, skin breakdown, erythema, warmth, callus or dry skin.  Left Foot:  Skin Integrity: Negative for ulcer, blister, skin breakdown, erythema, warmth, callus or dry skin.  Neurological: He is alert and oriented to person, place, and time. Gait normal.  Skin: Skin is warm, dry and intact.  Psychiatric: He has a normal mood and affect. His speech is normal and behavior is normal. Judgment and thought content normal. Cognition and memory  are normal.  Nursing note and vitals reviewed.   Assessment   1. HTN  2. DM 2  3. HM 4. Folliculitis barbae and acne kelodalis nuchae  5. Vit D def  Plan   1. Increase losartan to 50 mg qd  2. Cont jardiance on ARB and statin  rec reduce wt  F/u nutrition  Foot exam today nl blister healing right heel Urine had 05/20/17  Wants to think about pneumonia 23 vaccine  BMET, A1C today  He will let me know when get eye insurance for referral  3.  Did not have flu shot  Tdap had 02/28/08 per pt  Will rec hep B vaccine in future  Consider check MMR in future   Reviewed derm note 05/16/17 given clindamycin 1% lotion and halobetasol topical foam 0.05%  Dr. Nehemiah Massed    4.  Declines abx for now  Clindamycin, [facility] or benzoyl peroside  Use clobetasol to scalp  5. Vit D weekly then after 11/2017 5000 IU daily   ENT surgery 8/15 see HPI  Saw [facility] neurology 11/14/17 for sleep disturbances and OSA BP was 139/86 sch sleep study   Provider: Dr. Olivia Mackie [name]-Internal Medicine

## 2017-08-22 ENCOUNTER — Encounter: Payer: Self-pay | Admitting: *Deleted

## 2017-08-24 NOTE — Progress Notes (Signed)
Patient is not in [facility] 

## 2017-09-15 ENCOUNTER — Encounter
Admission: RE | Admit: 2017-09-15 | Discharge: 2017-09-15 | Disposition: A | Discharge status: Home / Self Care | Payer: Managed Care, Other (non HMO) | Source: Ambulatory Visit | Attending: Otolaryngology | Admitting: Otolaryngology

## 2017-09-15 NOTE — Pre-Procedure Instructions (Signed)
PATIENT BMI 56.4. AS INSTRUCTED BY DR Barbie BannerPENWARDEN,ANESTHESIA, BECKY AT DR VAUGHT'S NOTIFIED PATIENT [facility] SPOKE WITH PATIENT AND HE WILL CONTACT DR VAUGHT'S OFFICE.

## 2017-09-22 ENCOUNTER — Ambulatory Visit
Admission: RE | Admit: 2017-09-22 | Payer: Managed Care, Other (non HMO) | Source: Ambulatory Visit | Admitting: Otolaryngology

## 2017-09-22 ENCOUNTER — Encounter: Admission: RE | Payer: Self-pay | Source: Ambulatory Visit

## 2017-09-22 SURGERY — TONSILLECTOMY
Anesthesia: Choice

## 2017-10-25 ENCOUNTER — Ambulatory Visit (INDEPENDENT_AMBULATORY_CARE_PROVIDER_SITE_OTHER): Payer: Managed Care, Other (non HMO) | Admitting: Family Medicine

## 2017-10-25 ENCOUNTER — Encounter: Payer: Self-pay | Admitting: Family Medicine

## 2017-10-25 VITALS — BP 140/94 | HR 101 | Temp 99.1°F | Ht 73.0 in | Wt >= 6400 oz

## 2017-10-25 DIAGNOSIS — R070 Pain in throat: Secondary | ICD-10-CM

## 2017-10-25 DIAGNOSIS — H6592 Unspecified nonsuppurative otitis media, left ear: Secondary | ICD-10-CM

## 2017-10-25 LAB — POCT RAPID STREP A (OFFICE): RAPID STREP A SCREEN: NEGATIVE

## 2017-10-25 MED ORDER — AMOXICILLIN 875 MG PO TABS: 875.0000 mg | ORAL_TABLET | Freq: Two times a day (BID) | ORAL | 0 refills | Status: DC

## 2017-10-25 NOTE — Progress Notes (Signed)
   Subjective:    Patient ID: Rickey Payne, male    DOB: 18-Jun-1990, 27 y.o.   MRN: [id number]  HPI   Patient presents to clinic c/o congestion in ears, face, throat for 4-5 days. He has been following with ENT due to large tonsils, plan is to eventually surgically remove tonsils.  Denies fever or chills. States he has a history of many strep throat infections.   Patient Active Problem List   Diagnosis Date Noted  . Seasonal allergies 05/20/2017  . Vitamin D deficiency 05/20/2017  . DM (diabetes mellitus), type 2 (HCC) 05/20/2017  . Morbid obesity (HCC) 05/20/2017  . 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 Smoking status: Never Smoker  . Smokeless tobacco: Never Used  Substance Use Topics  . Alcohol use: Yes    Alcohol/week: 1.0 standard drinks    Types: 1 Cans of beer per week    Comment: occasional    Review of Systems   Constitutional: Negative for chills, fatigue and fever.  HENT: positive for congestion, ear pain, sinus pain and sore throat.   Eyes: Negative.   Respiratory: Negative for cough, shortness of breath and wheezing.   Cardiovascular: Negative for chest pain, palpitations and leg swelling.  Gastrointestinal: Negative for abdominal pain, diarrhea, nausea and vomiting.  Genitourinary: Negative for dysuria, frequency and urgency.  Musculoskeletal: Negative for arthralgias and myalgias.  Skin: Negative for color change, pallor and rash.  Neurological: Negative for syncope, light-headedness and headaches.  Psychiatric/Behavioral: The patient is not nervous/anxious.       Objective:   Physical Exam  Constitutional: He is oriented to person, place, and time.  Non-toxic appearance. He does not appear ill. No distress.  HENT:  Head: Normocephalic and atraumatic.  Mouth/Throat: Uvula is midline and mucous membranes are normal.  Patient has large tonsils (chronic issue). +redness in pharnyx. +post nasal drip. +redness and bulging  left TM. Right TM fullness.  Eyes: Pupils are equal, round, and reactive to light. EOM are normal.  Cardiovascular: Normal rate and regular rhythm.  Pulmonary/Chest: Effort normal and breath sounds normal. No respiratory distress.  Neurological: He is alert and oriented to person, place, and time.  Skin: Skin is warm and dry. No pallor.  Psychiatric: He has a normal mood and affect. His behavior is normal.  Nursing note and vitals reviewed.   Vitals:   10/25/17 1617  BP: (!) 140/94  Pulse: (!) 101  Temp: 99.1 F (37.3 C)  SpO2: 94%      Assessment & Plan:   Left otitis media -- Amoxicillin 875 mg BID for 10 days. Continue claritin and flonase  Pain in throat -- Rapid strep negative. Throat culture sent to lab. Patient will continue to follow with ENT as he has been to have large tonsils removed.  Keep regularly scheduled appt. Return to clinic sooner if issues arise.

## 2017-10-28 LAB — CULTURE, UPPER RESPIRATORY
MICRO NUMBER: 91114165
SPECIMEN QUALITY:: ADEQUATE

## 2017-11-11 ENCOUNTER — Other Ambulatory Visit: Payer: Self-pay | Admitting: Internal Medicine

## 2017-11-11 DIAGNOSIS — I10 Essential (primary) hypertension: Secondary | ICD-10-CM

## 2017-11-11 DIAGNOSIS — E119 Type 2 diabetes mellitus without complications: Secondary | ICD-10-CM

## 2017-11-11 MED ORDER — LOSARTAN POTASSIUM 50 MG PO TABS: 50.0000 mg | ORAL_TABLET | Freq: Every day | ORAL | 1 refills | Status: DC

## 2017-11-11 MED ORDER — EMPAGLIFLOZIN 10 MG PO TABS: 10.0000 mg | ORAL_TABLET | Freq: Every day | ORAL | 1 refills | Status: DC

## 2017-12-20 ENCOUNTER — Ambulatory Visit: Payer: Self-pay | Admitting: Internal Medicine

## 2018-01-25 ENCOUNTER — Ambulatory Visit (INDEPENDENT_AMBULATORY_CARE_PROVIDER_SITE_OTHER): Payer: Managed Care, Other (non HMO) | Admitting: Internal Medicine

## 2018-01-25 ENCOUNTER — Encounter: Payer: Self-pay | Admitting: Internal Medicine

## 2018-01-25 VITALS — BP 130/86 | HR 97 | Temp 98.1°F | Ht 73.0 in | Wt >= 6400 oz

## 2018-01-25 DIAGNOSIS — Z1159 Encounter for screening for other viral diseases: Secondary | ICD-10-CM

## 2018-01-25 DIAGNOSIS — E559 Vitamin D deficiency, unspecified: Secondary | ICD-10-CM

## 2018-01-25 DIAGNOSIS — Z0184 Encounter for antibody response examination: Secondary | ICD-10-CM

## 2018-01-25 DIAGNOSIS — I10 Essential (primary) hypertension: Secondary | ICD-10-CM

## 2018-01-25 DIAGNOSIS — Z1329 Encounter for screening for other suspected endocrine disorder: Secondary | ICD-10-CM

## 2018-01-25 DIAGNOSIS — E119 Type 2 diabetes mellitus without complications: Secondary | ICD-10-CM | POA: Diagnosis not present

## 2018-01-25 DIAGNOSIS — G4733 Obstructive sleep apnea (adult) (pediatric): Secondary | ICD-10-CM

## 2018-01-25 MED ORDER — PHENTERMINE HCL 37.5 MG PO TABS: 37.5000 mg | ORAL_TABLET | Freq: Every day | ORAL | 0 refills | Status: DC

## 2018-01-25 MED ORDER — LOSARTAN POTASSIUM 100 MG PO TABS: 100.0000 mg | ORAL_TABLET | Freq: Every day | ORAL | 3 refills | Status: DC

## 2018-01-25 NOTE — Progress Notes (Addendum)
Chief Complaint  Patient presents with  . Follow-up   F/u  1. DM 2 08/19/17 7.1 on jardiance 10 mg qd  2. HTN uncontrolled on losartan 50 mg qd BP still elevated at outside appts  3. Morbid obesity BMI 60 he wants to do adipex again again  4. Exposure to hep A he had 1/2 hep A vaccines at his [place] [facility].  5. C/w OSA home sleep study sch 02/17/18 and ENT will not remove uvula mass or tonsil due to needing sleep study     Review of Systems  Constitutional: Negative for weight loss.       Sweating at night   HENT: Negative for hearing loss.   Eyes: Negative for blurred vision.  Respiratory: Negative for shortness of breath.   Cardiovascular: Negative for chest pain.  Musculoskeletal: Negative for falls.  Skin: Negative for rash.  Neurological: Negative for headaches.  Psychiatric/Behavioral: Negative for depression.   Past Medical History:  Diagnosis Date  . Asthma    child  . Diabetes mellitus without complication ([place])   . Hyperlipidemia   . Hypertension    Past Surgical History:  Procedure Laterality Date  . right wrist fracture     Family History  Problem Relation Age of Onset  . Hypertension Mother   . Arthritis Mother   . Asthma Mother   . Depression Mother   . Hyperlipidemia Mother   . Diabetes Mother   . Hypertension Father   . Depression Father   . Hyperlipidemia Father   . Intellectual disability Father   . Stroke Father   . Heart disease Maternal Grandfather   . Diabetes Paternal Grandmother   . Drug abuse Paternal Grandmother   . Diabetes Maternal Grandmother    Social History   Socioeconomic History  . Marital status: Married    Spouse name: Not on file  . Number of children: Not on file  . Years of education: Not on file  . Highest education level: Not on file  Occupational History  . Not on file  Social Needs  . Financial resource strain: Not on file  . Food insecurity:    Worry: Not on file    Inability: Not on file  .  Transportation needs:    Medical: Not on file    Non-medical: Not on file  Tobacco Use  . Smoking status: Never Smoker  . Smokeless tobacco: Never Used  Substance and Sexual Activity  . Alcohol use: Yes    Alcohol/week: 1.0 standard drinks    Types: 1 Cans of beer per week    Comment: occasional  . Drug use: No  . Sexual activity: Yes  Lifestyle  . Physical activity:    Days per week: Not on file    Minutes per session: Not on file  . Stress: Not on file  Relationships  . Social connections:    Talks on phone: Not on file    Gets together: Not on file    Attends religious service: Not on file    Active member of club or organization: Not on file    Attends meetings of clubs or organizations: Not on file    Relationship status: Not on file  . Intimate partner violence:    Fear of current or ex partner: Not on file    Emotionally abused: Not on file    Physically abused: Not on file    Forced sexual activity: Not on file  Other Topics Concern  .  Not on file  Social History Narrative   Diploma, Pharmacologist    Married    Drinks occasionally    Never smoker, no chew    Owns guns, wears seat belt, safe in relationship       No outpatient medications have been marked as taking for the 01/25/18 encounter (Office Visit) with [name], Nino Glow, MD.   No Known Allergies No results found for this or any previous visit (from the past 2160 hour(s)). Objective  Body mass index is 58.9 kg/m. Wt Readings from Last 3 Encounters:  01/25/18 (!) 446 lb 6.4 oz (202.5 kg)  10/25/17 (!) 435 lb (197.3 kg)  08/19/17 (!) 427 lb 9.6 oz (194 kg)   Temp Readings from Last 3 Encounters:  01/25/18 98.1 F (36.7 C) (Oral)  10/25/17 99.1 F (37.3 C) (Oral)  08/19/17 98 F (36.7 C) (Oral)   BP Readings from Last 3 Encounters:  01/25/18 130/86  10/25/17 (!) 140/94  08/19/17 (!) 136/94   Pulse Readings from Last 3 Encounters:  01/25/18 97  10/25/17 (!) 101   08/19/17 91    Physical Exam Vitals signs and nursing note reviewed.  Constitutional:      Appearance: Normal appearance.  HENT:     Head: Normocephalic and atraumatic.     Mouth/Throat:     Mouth: Mucous membranes are moist.     Pharynx: Oropharynx is clear.  Eyes:     Conjunctiva/sclera: Conjunctivae normal.     Pupils: Pupils are equal, round, and reactive to light.  Cardiovascular:     Rate and Rhythm: Normal rate and regular rhythm.     Heart sounds: Normal heart sounds.  Pulmonary:     Effort: Pulmonary effort is normal.     Breath sounds: Normal breath sounds.  Skin:    General: Skin is warm and dry.  Neurological:     General: No focal deficit present.     Mental Status: He is alert and oriented to person, place, and time.     Gait: Gait normal.  Psychiatric:        Attention and Perception: Attention and perception normal.        Mood and Affect: Mood and affect normal.        Speech: Speech normal.        Behavior: Behavior normal. Behavior is cooperative.        Thought Content: Thought content normal.        Cognition and Memory: Cognition and memory normal.        Judgment: Judgment normal.     Assessment   1. DM2 A1C 7.1  2. HTN sl elevated  3. Morbid obesity BMI 60  4. C/w OSA  5. HM Plan   1. Cont meds  Check fasting labs 03/03/18 2. Inc losartan 100 mg qd  3. adipex 37.5 mg x 2 months will do another 2 months at f/u  4. Home sleep study 02/17/18 5.  Declines flu shot  Tdap had 02/28/08 per pt will do at f/u  Consider pna 23 vx  Had 1/2 hep A vaccines Will rec hep B vaccine in future disc today he declines for now  Consider check MMR in future  Physical at f/u   Reviewed derm note 05/16/17 given clindamycin 1% lotion and halobetasol topical foam 0.05%  Dr. Nehemiah Massed Sleep study 02/18/2018 severe OSA w/ hypoxemia <88% 215 minutes    Provider: Dr. Olivia Mackie [name]-Internal Medicine

## 2018-01-25 NOTE — Progress Notes (Signed)
Pre visit review using our clinic review tool, if applicable. No additional management support is needed unless otherwise documented below in the visit note. 

## 2018-01-25 NOTE — Patient Instructions (Addendum)
D3 5000 IU daily   New hepatitis B vaccine consider   Hepatitis B Vaccine, Recombinant injection What is this medicine? HEPATITIS B VACCINE (hep uh TAHY tis B VAK seen) is a vaccine. It is used to prevent an infection with the hepatitis B virus. This medicine may be used for other purposes; ask your health care provider or pharmacist if you have questions. COMMON BRAND NAME(S): Engerix-B, Recombivax HB What should I tell my health care provider before I take this medicine? They need to know if you have any of these conditions: -fever, infection -heart disease -hepatitis B infection -immune system problems -kidney disease -an unusual or allergic reaction to vaccines, yeast, other medicines, foods, dyes, or preservatives -pregnant or trying to get pregnant -breast-feeding How should I use this medicine? This vaccine is for injection into a muscle. It is given by a health care professional. A copy of Vaccine Information Statements will be given before each vaccination. Read this sheet carefully each time. The sheet may change frequently. Talk to your pediatrician regarding the use of this medicine in children. While this drug may be prescribed for children as young as newborn for selected conditions, precautions do apply. Overdosage: If you think you have taken too much of this medicine contact a poison control center or emergency room at once. NOTE: This medicine is only for you. Do not share this medicine with others. What if I miss a dose? It is important not to miss your dose. Call your doctor or health care professional if you are unable to keep an appointment. What may interact with this medicine? -medicines that suppress your immune function like adalimumab, anakinra, infliximab -medicines to treat cancer -steroid medicines like prednisone or cortisone This list may not describe all possible interactions. Give your health care provider a list of all the medicines, herbs,  non-prescription drugs, or dietary supplements you use. Also tell them if you smoke, drink alcohol, or use illegal drugs. Some items may interact with your medicine. What should I watch for while using this medicine? See your health care provider for all shots of this vaccine as directed. You must have 3 shots of this vaccine for protection from hepatitis B infection. Tell your doctor right away if you have any serious or unusual side effects after getting this vaccine. What side effects may I notice from receiving this medicine? Side effects that you should report to your doctor or health care professional as soon as possible: -allergic reactions like skin rash, itching or hives, swelling of the face, lips, or tongue -breathing problems -confused, irritated -fast, irregular heartbeat -flu-like syndrome -numb, tingling pain -seizures -unusually weak or tired Side effects that usually do not require medical attention (report to your doctor or health care professional if they continue or are bothersome): -diarrhea -fever -headache -loss of appetite -muscle pain -nausea -pain, redness, swelling, or irritation at site where injected -tiredness This list may not describe all possible side effects. Call your doctor for medical advice about side effects. You may report side effects to FDA at 1-800-FDA-1088. Where should I keep my medicine? This drug is given in a hospital or clinic and will not be stored at home. NOTE: This sheet is a summary. It may not cover all possible information. If you have questions about this medicine, talk to your doctor, pharmacist, or health care provider.  2019 Elsevier/Gold Standard (2013-05-28 13:26:01)

## 2018-03-01 ENCOUNTER — Other Ambulatory Visit: Payer: Self-pay

## 2018-03-01 ENCOUNTER — Other Ambulatory Visit (INDEPENDENT_AMBULATORY_CARE_PROVIDER_SITE_OTHER): Payer: Managed Care, Other (non HMO)

## 2018-03-01 DIAGNOSIS — E559 Vitamin D deficiency, unspecified: Secondary | ICD-10-CM | POA: Diagnosis not present

## 2018-03-01 DIAGNOSIS — Z1159 Encounter for screening for other viral diseases: Secondary | ICD-10-CM

## 2018-03-01 DIAGNOSIS — I10 Essential (primary) hypertension: Secondary | ICD-10-CM

## 2018-03-01 DIAGNOSIS — Z0184 Encounter for antibody response examination: Secondary | ICD-10-CM

## 2018-03-01 DIAGNOSIS — Z1329 Encounter for screening for other suspected endocrine disorder: Secondary | ICD-10-CM

## 2018-03-01 DIAGNOSIS — E119 Type 2 diabetes mellitus without complications: Secondary | ICD-10-CM

## 2018-03-01 NOTE — Addendum Note (Signed)
Addended by: Penne Lash on: 03/01/2018 03:49 PM   Modules accepted: Orders

## 2018-03-02 ENCOUNTER — Other Ambulatory Visit: Payer: Self-pay | Admitting: Internal Medicine

## 2018-03-02 DIAGNOSIS — E559 Vitamin D deficiency, unspecified: Secondary | ICD-10-CM

## 2018-03-02 DIAGNOSIS — E119 Type 2 diabetes mellitus without complications: Secondary | ICD-10-CM

## 2018-03-02 LAB — URINALYSIS, ROUTINE W REFLEX MICROSCOPIC
Bilirubin, UA: NEGATIVE
Ketones, UA: NEGATIVE
Leukocytes, UA: NEGATIVE
Nitrite, UA: NEGATIVE
Protein, UA: NEGATIVE
RBC, UA: NEGATIVE
SPEC GRAV UA: 1.028 (ref 1.005–1.030)
Urobilinogen, Ur: 0.2 mg/dL (ref 0.2–1.0)
pH, UA: 6.5 (ref 5.0–7.5)

## 2018-03-02 LAB — VITAMIN D 25 HYDROXY (VIT D DEFICIENCY, FRACTURES): VITD: 15.89 ng/mL — ABNORMAL LOW (ref 30.00–100.00)

## 2018-03-02 LAB — CBC WITH DIFFERENTIAL/PLATELET
Basophils Absolute: 0.1 10*3/uL (ref 0.0–0.1)
Basophils Relative: 1.2 % (ref 0.0–3.0)
EOS ABS: 0.4 10*3/uL (ref 0.0–0.7)
Eosinophils Relative: 7.6 % — ABNORMAL HIGH (ref 0.0–5.0)
HEMATOCRIT: 43.8 % (ref 39.0–52.0)
Hemoglobin: 14.8 g/dL (ref 13.0–17.0)
LYMPHS PCT: 47 % — AB (ref 12.0–46.0)
Lymphs Abs: 2.2 10*3/uL (ref 0.7–4.0)
MCHC: 33.7 g/dL (ref 30.0–36.0)
MCV: 84.4 fl (ref 78.0–100.0)
Monocytes Absolute: 0.4 10*3/uL (ref 0.1–1.0)
Monocytes Relative: 9 % (ref 3.0–12.0)
Neutro Abs: 1.6 10*3/uL (ref 1.4–7.7)
Neutrophils Relative %: 35.2 % — ABNORMAL LOW (ref 43.0–77.0)
Platelets: 317 10*3/uL (ref 150.0–400.0)
RBC: 5.19 Mil/uL (ref 4.22–5.81)
RDW: 14 % (ref 11.5–15.5)
WBC: 4.7 10*3/uL (ref 4.0–10.5)

## 2018-03-02 LAB — HEMOGLOBIN A1C: Hgb A1c MFr Bld: 8.2 % — ABNORMAL HIGH (ref 4.6–6.5)

## 2018-03-02 LAB — MEASLES/MUMPS/RUBELLA IMMUNITY
Mumps IgG: <9 AU/mL — ABNORMAL LOW
Rubella: 2.37 index
Rubeola IgG: 146 AU/mL

## 2018-03-02 LAB — COMPREHENSIVE METABOLIC PANEL
ALBUMIN: 4.3 g/dL (ref 3.5–5.2)
ALT: 47 U/L (ref 0–53)
AST: 27 U/L (ref 0–37)
Alkaline Phosphatase: 66 U/L (ref 39–117)
BUN: 13 mg/dL (ref 6–23)
CALCIUM: 9.2 mg/dL (ref 8.4–10.5)
CO2: 26 meq/L (ref 19–32)
Chloride: 101 mEq/L (ref 96–112)
Creatinine, Ser: 0.75 mg/dL (ref 0.40–1.50)
GFR: 150.04 mL/min (ref >60.00)
Glucose, Bld: 238 mg/dL — ABNORMAL HIGH (ref 70–99)
Potassium: 4.3 mEq/L (ref 3.5–5.1)
Sodium: 137 mEq/L (ref 135–145)
Total Bilirubin: 0.3 mg/dL (ref 0.2–1.2)
Total Protein: 6.8 g/dL (ref 6.0–8.3)

## 2018-03-02 LAB — MICROALBUMIN / CREATININE URINE RATIO
Creatinine, Urine: 48.4 mg/dL
Microalb/Creat Ratio: 12 mg/g creat (ref 0–29)
Microalbumin, Urine: 6 ug/mL

## 2018-03-02 LAB — LIPID PANEL
Cholesterol: 164 mg/dL (ref 0–200)
HDL: 27.5 mg/dL — AB (ref >39.00)
Total CHOL/HDL Ratio: 6
Triglycerides: 471 mg/dL — ABNORMAL HIGH (ref 0.0–149.0)

## 2018-03-02 LAB — LDL CHOLESTEROL, DIRECT: Direct LDL: 88 mg/dL

## 2018-03-02 LAB — T4, FREE: Free T4: 0.82 ng/dL (ref 0.60–1.60)

## 2018-03-02 LAB — TSH: TSH: 2.08 u[IU]/mL (ref 0.35–4.50)

## 2018-03-02 MED ORDER — CHOLECALCIFEROL 1.25 MG (50000 UT) PO CAPS: 50000.0000 [IU] | ORAL_CAPSULE | ORAL | 1 refills | Status: DC

## 2018-03-02 MED ORDER — EMPAGLIFLOZIN 25 MG PO TABS: 25.0000 mg | ORAL_TABLET | Freq: Every day | ORAL | 3 refills | Status: DC

## 2018-03-05 ENCOUNTER — Other Ambulatory Visit: Payer: Self-pay | Admitting: Internal Medicine

## 2018-03-05 DIAGNOSIS — E785 Hyperlipidemia, unspecified: Secondary | ICD-10-CM

## 2018-03-05 MED ORDER — ATORVASTATIN CALCIUM 20 MG PO TABS: 20.0000 mg | ORAL_TABLET | Freq: Every day | ORAL | 3 refills | Status: DC

## 2018-03-06 NOTE — Progress Notes (Signed)
Patient does not want to start any new medication he will change his jardiance to 25mg . He is not missing doses.

## 2018-03-07 DIAGNOSIS — G4733 Obstructive sleep apnea (adult) (pediatric): Secondary | ICD-10-CM | POA: Insufficient documentation

## 2018-03-30 ENCOUNTER — Ambulatory Visit: Payer: Self-pay | Admitting: Internal Medicine

## 2018-04-05 ENCOUNTER — Ambulatory Visit (INDEPENDENT_AMBULATORY_CARE_PROVIDER_SITE_OTHER): Payer: Managed Care, Other (non HMO) | Admitting: Internal Medicine

## 2018-04-05 ENCOUNTER — Encounter: Payer: Self-pay | Admitting: Internal Medicine

## 2018-04-05 VITALS — BP 122/86 | HR 111 | Temp 98.6°F | Ht 73.0 in | Wt >= 6400 oz

## 2018-04-05 DIAGNOSIS — Z23 Encounter for immunization: Secondary | ICD-10-CM

## 2018-04-05 DIAGNOSIS — E559 Vitamin D deficiency, unspecified: Secondary | ICD-10-CM

## 2018-04-05 DIAGNOSIS — I10 Essential (primary) hypertension: Secondary | ICD-10-CM | POA: Diagnosis not present

## 2018-04-05 DIAGNOSIS — Z0000 Encounter for general adult medical examination without abnormal findings: Secondary | ICD-10-CM | POA: Insufficient documentation

## 2018-04-05 DIAGNOSIS — E1169 Type 2 diabetes mellitus with other specified complication: Secondary | ICD-10-CM | POA: Insufficient documentation

## 2018-04-05 DIAGNOSIS — E1165 Type 2 diabetes mellitus with hyperglycemia: Secondary | ICD-10-CM | POA: Diagnosis not present

## 2018-04-05 DIAGNOSIS — E785 Hyperlipidemia, unspecified: Secondary | ICD-10-CM | POA: Insufficient documentation

## 2018-04-05 MED ORDER — SITAGLIPTIN PHOSPHATE 50 MG PO TABS: 50.0000 mg | ORAL_TABLET | Freq: Every day | ORAL | 3 refills | Status: DC

## 2018-04-05 MED ORDER — PHENTERMINE HCL 37.5 MG PO TABS: 37.5000 mg | ORAL_TABLET | Freq: Every day | ORAL | 0 refills | Status: DC

## 2018-04-05 NOTE — Progress Notes (Signed)
Chief Complaint  Patient presents with  . Annual Exam   Annual -he needs paperwork filled out for work but will bring back does not have forms today  1. DM 2 A1C from 7.1 to 8.2 03/01/18 on Jardiance 25 mg qd. He is agreeable to try Januvia but does not want to start Metformin due to c/w sweating and he reports he sweats already on Jardiance  2. BP122/86 today at home 140/90 at times max on adipex taking losartan 100 mg qd  HLD uncontrolled TGS elevated 03/01/18 he is on lipitor 20 mg but reports missing doses at times due to he works nights and instructions to take at nigh  Cholesterol: 164 HDL Cholesterol: 27.50 (L) Direct LDL: 88.0 MICROALB/CREAT RATIO: 12 Triglycerides: 471.0 Triglyceride is over 400; calculations on Lipids are invalid. (H)   3. Morbid obesity BMI 57.36 wants another 2 month supply Adipex. Weight is stable though he reports he is exercising   4. Due fore hep B vaccine   5. Severe OSA has not done cpap titration study due to he is thinking about getting enlarged tonsils removed with ENT to see if this helps with sleep apnea    Review of Systems  Constitutional: Negative for weight loss.  HENT: Negative for hearing loss.   Eyes: Negative for blurred vision.  Respiratory: Negative for shortness of breath.   Cardiovascular: Negative for chest pain.  Gastrointestinal: Negative for abdominal pain.  Genitourinary:       Denies yeast /uti side effects of Jardiance    Musculoskeletal: Negative for falls.  Skin: Negative for rash.  Neurological: Negative for headaches.  Endo/Heme/Allergies:       +increased sweating    Psychiatric/Behavioral: Negative for depression.   Past Medical History:  Diagnosis Date  . Asthma    child  . Diabetes mellitus without complication ([place])   . Hyperlipidemia   . Hypertension    Past Surgical History:  Procedure Laterality Date  . right wrist fracture     Family History  Problem Relation Age of Onset  . Hypertension  Mother   . Arthritis Mother   . Asthma Mother   . Depression Mother   . Hyperlipidemia Mother   . Diabetes Mother   . Hypertension Father   . Depression Father   . Hyperlipidemia Father   . Intellectual disability Father   . Stroke Father   . Heart disease Maternal Grandfather   . Diabetes Paternal Grandmother   . Drug abuse Paternal Grandmother   . Diabetes Maternal Grandmother    Social History   Socioeconomic History  . Marital status: Married    Spouse name: Not on file  . Number of children: Not on file  . Years of education: Not on file  . Highest education level: Not on file  Occupational History  . Not on file  Social Needs  . Financial resource strain: Not on file  . Food insecurity:    Worry: Not on file    Inability: Not on file  . Transportation needs:    Medical: Not on file    Non-medical: Not on file  Tobacco Use  . Smoking status: Never Smoker  . Smokeless tobacco: Never Used  Substance and Sexual Activity  . Alcohol use: Yes    Alcohol/week: 1.0 standard drinks    Types: 1 Cans of beer per week    Comment: occasional  . Drug use: No  . Sexual activity: Yes  Lifestyle  . Physical activity:  Days per week: Not on file    Minutes per session: Not on file  . Stress: Not on file  Relationships  . Social connections:    Talks on phone: Not on file    Gets together: Not on file    Attends religious service: Not on file    Active member of club or organization: Not on file    Attends meetings of clubs or organizations: Not on file    Relationship status: Not on file  . Intimate partner violence:    Fear of current or ex partner: Not on file    Emotionally abused: Not on file    Physically abused: Not on file    Forced sexual activity: Not on file  Other Topics Concern  . Not on file  Social History Narrative   Diploma, Pharmacologist    Married    Drinks occasionally    Never smoker, no chew    Owns guns, wears seat belt, safe  in relationship       Current Meds  Medication Sig  . atorvastatin (LIPITOR) 20 MG tablet Take 1 tablet (20 mg total) by mouth daily at 6 PM.  . Cholecalciferol 1.25 MG (50000 UT) capsule Take 1 capsule (50,000 Units total) by mouth once a week.  . clindamycin (CLEOCIN T) 1 % external solution Apply 1 application topically daily.   . clobetasol (TEMOVATE) 0.05 % external solution Apply 1 application topically 2 (two) times daily. Bid prn (Patient taking differently: Apply 1 application topically See admin instructions. Apply once daily on Mon, Tues, Wed, Thur, and Fri)  . empagliflozin (JARDIANCE) 25 MG TABS tablet Take 25 mg by mouth daily.  . fluticasone (FLONASE) 50 MCG/ACT nasal spray Place 1 spray into both nostrils daily as needed for allergies or rhinitis.  Marland Kitchen glucose blood test strip Use as instructed  . loratadine (CLARITIN) 10 MG tablet Take 10 mg by mouth daily.  Marland Kitchen losartan (COZAAR) 100 MG tablet Take 1 tablet (100 mg total) by mouth daily.  Glory Rosebush DELICA LANCETS 36I MISC 1 Stick by Does not apply route 3 (three) times daily.   No Known Allergies Recent Results (from the past 2160 hour(s))  Urine Microalbumin w/creat. ratio     Status: None   Collection Time: 03/01/18  3:49 PM  Result Value Ref Range   Creatinine, Urine 48.4 Not Estab. mg/dL   Microalbumin, Urine 6.0 Not Estab. ug/mL   Microalb/Creat Ratio 12 0 - 29 mg/g creat    Comment:                        Normal:                0 -  29                        Moderately increased: 30 - 300                        Severely Increased:       >300               **Please note reference interval change**   Urinalysis, Routine w reflex microscopic     Status: Abnormal   Collection Time: 03/01/18  3:49 PM  Result Value Ref Range   Specific Gravity, UA 1.028 1.005 - 1.030   pH, UA 6.5 5.0 - 7.5  Color, UA Yellow Yellow   Appearance Ur Clear Clear   Leukocytes, UA Negative Negative   Protein, UA Negative Negative/Trace    Glucose, UA 3+ (A) Negative   Ketones, UA Negative Negative   RBC, UA Negative Negative   Bilirubin, UA Negative Negative   Urobilinogen, Ur 0.2 0.2 - 1.0 mg/dL   Nitrite, UA Negative Negative   Microscopic Examination Comment     Comment: Microscopic not indicated and not performed.  Vitamin D (25 hydroxy)     Status: Abnormal   Collection Time: 03/01/18  3:49 PM  Result Value Ref Range   VITD 15.89 (L) 30.00 - 100.00 ng/mL  Measles/Mumps/Rubella Immunity     Status: Abnormal   Collection Time: 03/01/18  3:49 PM  Result Value Ref Range   Rubeola IgG 146.00 AU/mL    Comment: AU/mL            Interpretation -----            -------------- <13.50           Negative 13.50-16.49      Equivocal >16.49           Positive . A positive result indicates that the patient has antibody to measles virus. It does not differentiate  between an active or past infection. The clinical  diagnosis must be interpreted in conjunction with  clinical signs and symptoms of the patient.    Mumps IgG <9.00 (L) AU/mL    Comment:  AU/mL           Interpretation -------         ---------------- <9.00             Negative 9.00-10.99        Equivocal >10.99            Positive A positive result indicates that the patient has  antibody to mumps virus. It does not differentiate between an  active or past infection. The clinical diagnosis must be interpreted in conjunction with clinical signs and symptoms of the patient. .    Rubella 2.37 index    Comment:     Index            Interpretation     -----            --------------       <0.90            Not consistent with Immunity     0.90-0.99        Equivocal     > or = 1.00      Consistent with Immunity  . The presence of rubella IgG antibody suggests  immunization or past or current infection with rubella virus.   Hemoglobin A1c     Status: Abnormal   Collection Time: 03/01/18  3:49 PM  Result Value Ref Range   Hgb A1c MFr Bld 8.2 (H) 4.6 -  6.5 %    Comment: Glycemic Control Guidelines for People with Diabetes:Non Diabetic:  <6%Goal of Therapy: <7%Additional Action Suggested:  >8%   T4, free     Status: None   Collection Time: 03/01/18  3:49 PM  Result Value Ref Range   Free T4 0.82 0.60 - 1.60 ng/dL    Comment: Specimens from patients who are undergoing biotin therapy and /or ingesting biotin supplements may contain high levels of biotin.  The higher biotin concentration in these specimens interferes with this Free T4 assay.  Specimens that contain high  levels  of biotin may cause false high results for this Free T4 assay.  Please interpret results in light of the total clinical presentation of the patient.    TSH     Status: None   Collection Time: 03/01/18  3:49 PM  Result Value Ref Range   TSH 2.08 0.35 - 4.50 uIU/mL  Lipid panel     Status: Abnormal   Collection Time: 03/01/18  3:49 PM  Result Value Ref Range   Cholesterol 164 0 - 200 mg/dL    Comment: ATP III Classification       Desirable:  < 200 mg/dL               Borderline High:  200 - 239 mg/dL          High:  > = 240 mg/dL   Triglycerides (H) 0.0 - 149.0 mg/dL    471.0 Triglyceride is over 400; calculations on Lipids are invalid.    Comment: Normal:  <150 mg/dLBorderline High:  150 - 199 mg/dL   HDL 27.50 (L) >39.00 mg/dL   Total CHOL/HDL Ratio 6     Comment:                Men          Women1/2 Average Risk     3.4          3.3Average Risk          5.0          4.42X Average Risk          9.6          7.13X Average Risk          15.0          11.0                      CBC with Differential/Platelet     Status: Abnormal   Collection Time: 03/01/18  3:49 PM  Result Value Ref Range   WBC 4.7 4.0 - 10.5 K/uL   RBC 5.19 4.22 - 5.81 Mil/uL   Hemoglobin 14.8 13.0 - 17.0 g/dL   HCT 43.8 39.0 - 52.0 %   MCV 84.4 78.0 - 100.0 fl   MCHC 33.7 30.0 - 36.0 g/dL   RDW 14.0 11.5 - 15.5 %   Platelets 317.0 150.0 - 400.0 K/uL   Neutrophils Relative % 35.2 (L) 43.0 - 77.0  %   Lymphocytes Relative 47.0 (H) 12.0 - 46.0 %   Monocytes Relative 9.0 3.0 - 12.0 %   Eosinophils Relative 7.6 (H) 0.0 - 5.0 %   Basophils Relative 1.2 0.0 - 3.0 %   Neutro Abs 1.6 1.4 - 7.7 K/uL   Lymphs Abs 2.2 0.7 - 4.0 K/uL   Monocytes Absolute 0.4 0.1 - 1.0 K/uL   Eosinophils Absolute 0.4 0.0 - 0.7 K/uL   Basophils Absolute 0.1 0.0 - 0.1 K/uL  Comprehensive metabolic panel     Status: Abnormal   Collection Time: 03/01/18  3:49 PM  Result Value Ref Range   Sodium 137 135 - 145 mEq/L   Potassium 4.3 3.5 - 5.1 mEq/L   Chloride 101 96 - 112 mEq/L   CO2 26 19 - 32 mEq/L   Glucose, Bld 238 (H) 70 - 99 mg/dL   BUN 13 6 - 23 mg/dL   Creatinine, Ser 0.75 0.40 - 1.50 mg/dL   Total Bilirubin 0.3 0.2 - 1.2 mg/dL   Alkaline  Phosphatase 66 39 - 117 U/L   AST 27 0 - 37 U/L   ALT 47 0 - 53 U/L   Total Protein 6.8 6.0 - 8.3 g/dL   Albumin 4.3 3.5 - 5.2 g/dL   Calcium 9.2 8.4 - 10.5 mg/dL   GFR 150.04 >60.00 mL/min  LDL cholesterol, direct     Status: None   Collection Time: 03/01/18  3:49 PM  Result Value Ref Range   Direct LDL 88.0 mg/dL    Comment: Optimal:  <100 mg/dLNear or Above Optimal:  100-129 mg/dLBorderline High:  130-159 mg/dLHigh:  160-189 mg/dLVery High:  >190 mg/dL   Objective  Body mass index is 57.36 kg/m. Wt Readings from Last 3 Encounters:  04/05/18 (!) 434 lb 12.8 oz (197.2 kg)  01/25/18 (!) 446 lb 6.4 oz (202.5 kg)  10/25/17 (!) 435 lb (197.3 kg)   Temp Readings from Last 3 Encounters:  04/05/18 98.6 F (37 C) (Oral)  01/25/18 98.1 F (36.7 C) (Oral)  10/25/17 99.1 F (37.3 C) (Oral)   BP Readings from Last 3 Encounters:  04/05/18 122/86  01/25/18 130/86  10/25/17 (!) 140/94   Pulse Readings from Last 3 Encounters:  04/05/18 (!) 111  01/25/18 97  10/25/17 (!) 101    Physical Exam Vitals signs and nursing note reviewed.  Constitutional:      Appearance: Normal appearance. He is well-developed and well-groomed.  HENT:     Head: Normocephalic  and atraumatic.     Nose: Nose normal.     Mouth/Throat:     Mouth: Mucous membranes are moist.     Pharynx: Oropharynx is clear.     Comments: Uvula mass prev noted   Eyes:     Conjunctiva/sclera: Conjunctivae normal.     Pupils: Pupils are equal, round, and reactive to light.  Cardiovascular:     Rate and Rhythm: Normal rate and regular rhythm.     Heart sounds: Normal heart sounds. No murmur.  Pulmonary:     Effort: Pulmonary effort is normal.     Breath sounds: Normal breath sounds.  Skin:    General: Skin is warm and dry.  Neurological:     General: No focal deficit present.     Mental Status: He is alert and oriented to person, place, and time. Mental status is at baseline.     Gait: Gait normal.  Psychiatric:        Attention and Perception: Attention and perception normal.        Mood and Affect: Mood and affect normal.        Speech: Speech normal.        Behavior: Behavior normal. Behavior is cooperative.        Thought Content: Thought content normal.        Cognition and Memory: Cognition and memory normal.        Judgment: Judgment normal.     Assessment   1. Annual  2. DM 2 A1C 7.1 to 8.2 03/01/18  3. HTN/HLD  4. Morbid obesity BMI >57 5. Severe OSA not on cpap with enlarged tonsils  Plan  Declines flu shot  Tdap had 02/28/08 per  -ptwill do at f/u  Consider pna 23 vx  Had 1/2 hep A vaccines New hep B vx given today will need repeat in 1 month  rec MMR in future not immune  rec healthy diet and exercise  Waist circumference is 61 inches   Reviewed derm note 05/16/17 given clindamycin 1% lotion  and halobetasol topical foam 0.05%  Dr. Nehemiah Massed 2. Add januvia 50 to jardiance 25 mg qd  Check fasting labs in 3 months  Urine protein had 03/01/18  Still needs eye exam in future  Consider pna 23 vaccine  On ARB and statin  3. Cont meds for now rec take hld meds daily  Given cholesterol handout  4. Healthy diet and exercise adipex refilled this will be  total 62month will need 3 month break  5.  Sleep study 02/18/2018 severe OSA w/ hypoxemia <88% 215 minutes He did not do cpap titration study he wants tonsils removed with ENT to see if will help with OSA 1st     Provider: Dr. TOlivia MackieMcLean-Scocuzza-Internal Medicine

## 2018-04-05 NOTE — Patient Instructions (Addendum)
Fish oil 2000 mg 2x per day most pure EPA/DHA total 4000 mg daily   Rec MMR vaccine at pharmacy or health dept   Vitamin D3 1x per week x 6 months then otc D3 5000 IU daily starting onth 7   Hepatitis B Vaccine, Recombinant injection What is this medicine? HEPATITIS B VACCINE (hep uh TAHY tis B VAK seen) is a vaccine. It is used to prevent an infection with the hepatitis B virus. This medicine may be used for other purposes; ask your health care provider or pharmacist if you have questions. COMMON BRAND NAME(S): Engerix-B, Recombivax HB What should I tell my health care provider before I take this medicine? They need to know if you have any of these conditions: -fever, infection -heart disease -hepatitis B infection -immune system problems -kidney disease -an unusual or allergic reaction to vaccines, yeast, other medicines, foods, dyes, or preservatives -pregnant or trying to get pregnant -breast-feeding How should I use this medicine? This vaccine is for injection into a muscle. It is given by a health care professional. A copy of Vaccine Information Statements will be given before each vaccination. Read this sheet carefully each time. The sheet may change frequently. Talk to your pediatrician regarding the use of this medicine in children. While this drug may be prescribed for children as young as newborn for selected conditions, precautions do apply. Overdosage: If you think you have taken too much of this medicine contact a poison control center or emergency room at once. NOTE: This medicine is only for you. Do not share this medicine with others. What if I miss a dose? It is important not to miss your dose. Call your doctor or health care professional if you are unable to keep an appointment. What may interact with this medicine? -medicines that suppress your immune function like adalimumab, anakinra, infliximab -medicines to treat cancer -steroid medicines like prednisone or  cortisone This list may not describe all possible interactions. Give your health care provider a list of all the medicines, herbs, non-prescription drugs, or dietary supplements you use. Also tell them if you smoke, drink alcohol, or use illegal drugs. Some items may interact with your medicine. What should I watch for while using this medicine? See your health care provider for all shots of this vaccine as directed. You must have 3 shots of this vaccine for protection from hepatitis B infection. Tell your doctor right away if you have any serious or unusual side effects after getting this vaccine. What side effects may I notice from receiving this medicine? Side effects that you should report to your doctor or health care professional as soon as possible: -allergic reactions like skin rash, itching or hives, swelling of the face, lips, or tongue -breathing problems -confused, irritated -fast, irregular heartbeat -flu-like syndrome -numb, tingling pain -seizures -unusually weak or tired Side effects that usually do not require medical attention (report to your doctor or health care professional if they continue or are bothersome): -diarrhea -fever -headache -loss of appetite -muscle pain -nausea -pain, redness, swelling, or irritation at site where injected -tiredness This list may not describe all possible side effects. Call your doctor for medical advice about side effects. You may report side effects to FDA at 1-800-FDA-1088. Where should I keep my medicine? This drug is given in a hospital or clinic and will not be stored at home. NOTE: This sheet is a summary. It may not cover all possible information. If you have questions about this medicine, talk to  your doctor, pharmacist, or health care provider.  2019 Elsevier/Gold Standard (2013-05-28 13:26:01)    Diabetes Mellitus and Nutrition, Adult When you have diabetes (diabetes mellitus), it is very important to have healthy eating  habits because your blood sugar (glucose) levels are greatly affected by what you eat and drink. Eating healthy foods in the appropriate amounts, at about the same times every day, can help you:  Control your blood glucose.  Lower your risk of heart disease.  Improve your blood pressure.  Reach or maintain a healthy weight. Every person with diabetes is different, and each person has different needs for a meal plan. Your health care provider may recommend that you work with a diet and nutrition specialist (dietitian) to make a meal plan that is best for you. Your meal plan may vary depending on factors such as:  The calories you need.  The medicines you take.  Your weight.  Your blood glucose, blood pressure, and cholesterol levels.  Your activity level.  Other health conditions you have, such as heart or kidney disease. How do carbohydrates affect me? Carbohydrates, also called carbs, affect your blood glucose level more than any other type of food. Eating carbs naturally raises the amount of glucose in your blood. Carb counting is a method for keeping track of how many carbs you eat. Counting carbs is important to keep your blood glucose at a healthy level, especially if you use insulin or take certain oral diabetes medicines. It is important to know how many carbs you can safely have in each meal. This is different for every person. Your dietitian can help you calculate how many carbs you should have at each meal and for each snack. Foods that contain carbs include:  Bread, cereal, rice, pasta, and crackers.  Potatoes and corn.  Peas, beans, and lentils.  Milk and yogurt.  Fruit and juice.  Desserts, such as cakes, cookies, ice cream, and candy. How does alcohol affect me? Alcohol can cause a sudden decrease in blood glucose (hypoglycemia), especially if you use insulin or take certain oral diabetes medicines. Hypoglycemia can be a life-threatening condition. Symptoms of  hypoglycemia (sleepiness, dizziness, and confusion) are similar to symptoms of having too much alcohol. If your health care provider says that alcohol is safe for you, follow these guidelines:  Limit alcohol intake to no more than 1 drink per day for nonpregnant women and 2 drinks per day for men. One drink equals 12 oz of beer, 5 oz of wine, or 1 oz of hard liquor.  Do not drink on an empty stomach.  Keep yourself hydrated with water, diet soda, or unsweetened iced tea.  Keep in mind that regular soda, juice, and other mixers may contain a lot of sugar and must be counted as carbs. What are tips for following this plan?  Reading food labels  Start by checking the serving size on the "Nutrition Facts" label of packaged foods and drinks. The amount of calories, carbs, fats, and other nutrients listed on the label is based on one serving of the item. Many items contain more than one serving per package.  Check the total grams (g) of carbs in one serving. You can calculate the number of servings of carbs in one serving by dividing the total carbs by 15. For example, if a food has 30 g of total carbs, it would be equal to 2 servings of carbs.  Check the number of grams (g) of saturated and trans fats in one  serving. Choose foods that have low or no amount of these fats.  Check the number of milligrams (mg) of salt (sodium) in one serving. Most people should limit total sodium intake to less than 2,300 mg per day.  Always check the nutrition information of foods labeled as "low-fat" or "nonfat". These foods may be higher in added sugar or refined carbs and should be avoided.  Talk to your dietitian to identify your daily goals for nutrients listed on the label. Shopping  Avoid buying canned, premade, or processed foods. These foods tend to be high in fat, sodium, and added sugar.  Shop around the outside edge of the grocery store. This includes fresh fruits and vegetables, bulk grains, fresh  meats, and fresh dairy. Cooking  Use low-heat cooking methods, such as baking, instead of high-heat cooking methods like deep frying.  Cook using healthy oils, such as olive, canola, or sunflower oil.  Avoid cooking with butter, cream, or high-fat meats. Meal planning  Eat meals and snacks regularly, preferably at the same times every day. Avoid going long periods of time without eating.  Eat foods high in fiber, such as fresh fruits, vegetables, beans, and whole grains. Talk to your dietitian about how many servings of carbs you can eat at each meal.  Eat 4-6 ounces (oz) of lean protein each day, such as lean meat, chicken, fish, eggs, or tofu. One oz of lean protein is equal to: ? 1 oz of meat, chicken, or fish. ? 1 egg. ?  cup of tofu.  Eat some foods each day that contain healthy fats, such as avocado, nuts, seeds, and fish. Lifestyle  Check your blood glucose regularly.  Exercise regularly as told by your health care provider. This may include: ? 150 minutes of moderate-intensity or vigorous-intensity exercise each week. This could be brisk walking, biking, or water aerobics. ? Stretching and doing strength exercises, such as yoga or weightlifting, at least 2 times a week.  Take medicines as told by your health care provider.  Do not use any products that contain nicotine or tobacco, such as cigarettes and e-cigarettes. If you need help quitting, ask your health care provider.  Work with a Social worker or diabetes educator to identify strategies to manage stress and any emotional and social challenges. Questions to ask a health care provider  Do I need to meet with a diabetes educator?  Do I need to meet with a dietitian?  What number can I call if I have questions?  When are the best times to check my blood glucose? Where to find more information:  American Diabetes Association: diabetes.org  Academy of Nutrition and Dietetics: www.eatright.[employer]  of Diabetes and Digestive and Kidney Diseases (NIH): [employer] Summary  A healthy meal plan will help you control your blood glucose and maintain a healthy lifestyle.  Working with a diet and nutrition specialist (dietitian) can help you make a meal plan that is best for you.  Keep in mind that carbohydrates (carbs) and alcohol have immediate effects on your blood glucose levels. It is important to count carbs and to use alcohol carefully. This information is not intended to replace advice given to you by your health care provider. Make sure you discuss any questions you have with your health care provider. Document Released: 10/22/2004 Document Revised: 08/25/2016 Document Reviewed: 03/01/2016 Elsevier Interactive Patient Education  2019 [place].   Sitagliptin oral tablet What is this medicine? SITAGLIPTIN (sit a GLIP tin) helps to treat type  2 diabetes. It helps to control blood sugar. Treatment is combined with diet and exercise. This medicine may be used for other purposes; ask your health care provider or pharmacist if you have questions. COMMON BRAND NAME(S): Januvia What should I tell my health care provider before I take this medicine? They need to know if you have any of these conditions: -diabetic ketoacidosis -kidney disease -pancreatitis -previous swelling of the tongue, face, or lips with difficulty breathing, difficulty swallowing, hoarseness, or tightening of the throat -type 1 diabetes -an unusual or allergic reaction to sitagliptin, other medicines, foods, dyes, or preservatives -pregnant or trying to get pregnant -breast-feeding How should I use this medicine? Take this medicine by mouth with a glass of water. Follow the directions on the prescription label. You can take it with or without food. Do not cut, crush or chew this medicine. Take your dose at the same time each day. Do not take more often than directed. Do not stop taking except on your doctor's  advice. A special MedGuide will be given to you by the pharmacist with each prescription and refill. Be sure to read this information carefully each time. Talk to your pediatrician regarding the use of this medicine in children. Special care may be needed. Overdosage: If you think you have taken too much of this medicine contact a poison control center or emergency room at once. NOTE: This medicine is only for you. Do not share this medicine with others. What if I miss a dose? If you miss a dose, take it as soon as you can. If it is almost time for your next dose, take only that dose. Do not take double or extra doses. What may interact with this medicine? Do not take this medicine with any of the following medications: -gatifloxacin This medicine may also interact with the following medications: -alcohol -digoxin -insulin -sulfonylureas like glimepiride, glipizide, glyburide This list may not describe all possible interactions. Give your health care provider a list of all the medicines, herbs, non-prescription drugs, or dietary supplements you use. Also tell them if you smoke, drink alcohol, or use illegal drugs. Some items may interact with your medicine. What should I watch for while using this medicine? Visit your doctor or health care professional for regular checks on your progress. A test called the HbA1C (A1C) will be monitored. This is a simple blood test. It measures your blood sugar control over the last 2 to 3 months. You will receive this test every 3 to 6 months. Learn how to check your blood sugar. Learn the symptoms of low and high blood sugar and how to manage them. Always carry a quick-source of sugar with you in case you have symptoms of low blood sugar. Examples include hard sugar candy or glucose tablets. Make sure others know that you can choke if you eat or drink when you develop serious symptoms of low blood sugar, such as seizures or unconsciousness. They must get medical  help at once. Tell your doctor or health care professional if you have high blood sugar. You might need to change the dose of your medicine. If you are sick or exercising more than usual, you might need to change the dose of your medicine. Do not skip meals. Ask your doctor or health care professional if you should avoid alcohol. Many nonprescription cough and cold products contain sugar or alcohol. These can affect blood sugar. Wear a medical ID bracelet or chain, and carry a card that describes your disease  and details of your medicine and dosage times. What side effects may I notice from receiving this medicine? Side effects that you should report to your doctor or health care professional as soon as possible: -allergic reactions like skin rash, itching or hives, swelling of the face, lips, or tongue -breathing problems -general ill feeling or flu-like symptoms -joint pain -loss of appetite -redness, blistering, peeling or loosening of the skin, including inside the mouth -signs and symptoms of heart failure like breathing problems, fast, irregular heartbeat, sudden weight gain; swelling of the ankles, feet, hands; unusually weak or tired -signs and symptoms of low blood sugar such as feeling anxious, confusion, dizziness, increased hunger, unusually weak or tired, sweating, shakiness, cold, irritable, headache, blurred vision, fast heartbeat, loss of consciousness -signs and symptoms of muscle injury like dark urine; trouble passing urine or change in the amount of urine; unusually weak or tired; muscle pain; back pain -unusual stomach upset or pain -vomiting Side effects that usually do not require medical attention (report to your doctor or health care professional if they continue or are bothersome): -diarrhea -headache -sore throat -stomach upset -stuffy or runny nose This list may not describe all possible side effects. Call your doctor for medical advice about side effects. You may  report side effects to FDA at 1-800-FDA-1088. Where should I keep my medicine? Keep out of the reach of children. Store at room temperature between 15 and 30 degrees C (59 and 86 degrees F). Throw away any unused medicine after the expiration date. NOTE: This sheet is a summary. It may not cover all possible information. If you have questions about this medicine, talk to your doctor, pharmacist, or health care provider.  2019 Elsevier/Gold Standard (2017-08-30 16:38:43)   Cholesterol Cholesterol is a white, waxy, fat-like substance that is needed by the human body in small amounts. The liver makes all the cholesterol we need. Cholesterol is carried from the liver by the blood through the blood vessels. Deposits of cholesterol (plaques) may build up on blood vessel (artery) walls. Plaques make the arteries narrower and stiffer. Cholesterol plaques increase the risk for heart attack and stroke. You cannot feel your cholesterol level even if it is very high. The only way to know that it is high is to have a blood test. Once you know your cholesterol levels, you should keep a record of the test results. Work with your health care provider to keep your levels in the desired range. What do the results mean?  Total cholesterol is a rough measure of all the cholesterol in your blood.  LDL (low-density lipoprotein) is the "bad" cholesterol. This is the type that causes plaque to build up on the artery walls. You want this level to be low.  HDL (high-density lipoprotein) is the "good" cholesterol because it cleans the arteries and carries the LDL away. You want this level to be high.  Triglycerides are fat that the body can either burn for energy or store. High levels are closely linked to heart disease. What are the desired levels of cholesterol?  Total cholesterol below 200.  LDL below 100 for people who are at risk, below 70 for people at very high risk.  HDL above 40 is good. A level of 60 or  higher is considered to be protective against heart disease.  Triglycerides below 150. How can I lower my cholesterol? Diet Follow your diet program as told by your health care provider.  Choose fish or white meat chicken and [place],  roasted or baked. Limit fatty cuts of red meat, fried foods, and processed meats, such as sausage and lunch meats.  Eat lots of fresh fruits and vegetables.  Choose whole grains, beans, pasta, potatoes, and cereals.  Choose olive oil, corn oil, or canola oil, and use only small amounts.  Avoid butter, mayonnaise, shortening, or palm kernel oils.  Avoid foods with trans fats.  Drink skim or nonfat milk and eat low-fat or nonfat yogurt and cheeses. Avoid whole milk, cream, ice cream, egg yolks, and full-fat cheeses.  Healthier desserts include angel food cake, ginger snaps, animal crackers, hard candy, popsicles, and low-fat or nonfat frozen yogurt. Avoid pastries, cakes, pies, and cookies.  Exercise  Follow your exercise program as told by your health care provider. A regular program: ? Helps to decrease LDL and raise HDL. ? Helps with weight control.  Do things that increase your activity level, such as gardening, walking, and taking the stairs.  Ask your health care provider about ways that you can be more active in your daily life. Medicine  Take over-the-counter and prescription medicines only as told by your health care provider. ? Medicine may be prescribed by your health care provider to help lower cholesterol and decrease the risk for heart disease. This is usually done if diet and exercise have failed to bring down cholesterol levels. ? If you have several risk factors, you may need medicine even if your levels are normal. This information is not intended to replace advice given to you by your health care provider. Make sure you discuss any questions you have with your health care provider. Document Released: 10/20/2000 Document Revised:  08/23/2015 Document Reviewed: 07/26/2015 Elsevier Interactive Patient Education  [employer].

## 2018-04-05 NOTE — Progress Notes (Signed)
Pre visit review using our clinic review tool, if applicable. No additional management support is needed unless otherwise documented below in the visit note. 

## 2018-04-10 ENCOUNTER — Encounter: Payer: Self-pay | Admitting: Internal Medicine

## 2018-04-14 ENCOUNTER — Other Ambulatory Visit: Payer: Self-pay | Admitting: Internal Medicine

## 2018-04-14 ENCOUNTER — Telehealth: Payer: Self-pay | Admitting: Internal Medicine

## 2018-04-14 NOTE — Telephone Encounter (Signed)
Noted   tMS 

## 2018-04-14 NOTE — Telephone Encounter (Signed)
Copied from CRM [id number]. Topic: Quick Communication - See Telephone Encounter >> Apr 14, 2018  4:41 PM Angela Nevin wrote: CRM for notification. See Telephone encounter for: 04/14/18.  Patient called requesting Dr. Judie Grieve "retract" the medication sitaGLIPtin (JANUVIA) 50 MG tablet as it is too expensive. When asked if he was requesting an alternate medication, he declined stating that he will try to diet and attempt a healthier lifestyle.

## 2018-04-24 ENCOUNTER — Other Ambulatory Visit: Payer: Self-pay

## 2018-05-04 ENCOUNTER — Ambulatory Visit: Payer: Self-pay

## 2018-05-10 ENCOUNTER — Other Ambulatory Visit: Payer: Self-pay

## 2018-05-10 ENCOUNTER — Ambulatory Visit (INDEPENDENT_AMBULATORY_CARE_PROVIDER_SITE_OTHER): Payer: Managed Care, Other (non HMO)

## 2018-05-10 DIAGNOSIS — Z23 Encounter for immunization: Secondary | ICD-10-CM

## 2018-05-10 MED ORDER — HEPATITIS B VAC RECOMBINANT 5 MCG/0.5ML IJ SUSP: 0.5000 mL | Freq: Once | INTRAMUSCULAR | Status: DC

## 2018-05-18 ENCOUNTER — Telehealth: Payer: Self-pay

## 2018-05-18 ENCOUNTER — Encounter: Payer: Self-pay | Admitting: Internal Medicine

## 2018-05-18 ENCOUNTER — Other Ambulatory Visit: Payer: Self-pay | Admitting: Internal Medicine

## 2018-05-18 ENCOUNTER — Telehealth: Payer: Self-pay | Admitting: Internal Medicine

## 2018-05-18 DIAGNOSIS — E1165 Type 2 diabetes mellitus with hyperglycemia: Secondary | ICD-10-CM

## 2018-05-18 MED ORDER — METFORMIN HCL ER 500 MG PO TB24: 500.0000 mg | ORAL_TABLET | Freq: Every day | ORAL | 3 refills | Status: DC

## 2018-05-18 NOTE — Telephone Encounter (Signed)
Copied from CRM [id number]. Topic: General - Inquiry >> May 18, 2018 10:20 AM Rickey Payne, NT wrote: Reason for CRM: Patient is calling in regards to a work note stating he is a diabetic. Please email to [email or web address] or call back at [phone]. Patient is also requesting that since he is taking the metformin if he can stop taking the jardiance. Please advise

## 2018-05-18 NOTE — Progress Notes (Signed)
Pt states his letter was in correct. He needs it to say he is a diabetic and what medication he is taking for it. Please upload back to his mychart thanks  Patient is also requesting that since he is taking the metformin if he can stop taking the jardiance. Please advise

## 2018-05-18 NOTE — Telephone Encounter (Signed)
Patient wants to take jardiance and metformin.

## 2018-05-18 NOTE — Telephone Encounter (Signed)
His A1C was 8.2 03/01/2018  Metformin alone is not going to help and he told me he did not want to take this -does he want to take this?   If he does not want to take Jardiance any long the only other oral option is to add Januvia or Glipizide side effect of Glipizide would be weight gain  Other options are weekly 1x per week injections or daily injections of non insulin  His diabetes is not under control and if we dont get it under control he will be on insulin  What would he like to do?   Note is in epic and we will mail   TMS

## 2018-05-18 NOTE — Telephone Encounter (Signed)
See prior documentation   TMS

## 2018-05-18 NOTE — Telephone Encounter (Signed)
Letter was revised and put in the mail today.  Rickey Payne,cma

## 2018-05-18 NOTE — Telephone Encounter (Signed)
Pt states his letter was in correct. He needs it to say he is a diabetic and what medication he is taking for it. Please upload back to his mychart thanks

## 2018-05-19 ENCOUNTER — Ambulatory Visit: Payer: Self-pay

## 2018-05-19 ENCOUNTER — Encounter: Payer: Self-pay | Admitting: Internal Medicine

## 2018-05-19 NOTE — Telephone Encounter (Signed)
Pt request more information on the note for his employer. Will route to office. Pt uses MyChart OK to send revision to My chart.  Answer Assessment - Initial Assessment Questions 1. REASON FOR CALL or QUESTION: "What is your reason for calling today?" or "How can I best help you?" or "What question do you have that I can help answer?"     Pt states his employer is needs more information on his note he received.  Protocols used: INFORMATION ONLY CALL-A-AH

## 2018-05-22 ENCOUNTER — Encounter: Payer: Self-pay | Admitting: Internal Medicine

## 2018-06-25 ENCOUNTER — Encounter: Payer: Self-pay | Admitting: Internal Medicine

## 2018-06-26 ENCOUNTER — Other Ambulatory Visit: Payer: Self-pay | Admitting: Internal Medicine

## 2018-06-26 DIAGNOSIS — I10 Essential (primary) hypertension: Secondary | ICD-10-CM

## 2018-06-26 MED ORDER — HYDROCHLOROTHIAZIDE 12.5 MG PO TABS: 12.5000 mg | ORAL_TABLET | Freq: Every day | ORAL | 0 refills | Status: DC

## 2018-07-04 ENCOUNTER — Other Ambulatory Visit: Payer: Self-pay

## 2018-07-06 ENCOUNTER — Other Ambulatory Visit: Payer: Managed Care, Other (non HMO)

## 2018-07-06 ENCOUNTER — Telehealth: Payer: Self-pay | Admitting: *Deleted

## 2018-07-06 NOTE — Telephone Encounter (Signed)
Copied from CRM [id number]. Topic: Appointment Scheduling - Scheduling Inquiry for Clinic >> Jul 06, 2018  9:05 AM Fanny Bien wrote: Reason for CRM: pt called and stated that he needs to reschedule lab appointment because babysitter did not show up. Pt would like a call back about rescheduling. Please advise

## 2018-07-11 ENCOUNTER — Telehealth: Payer: Self-pay

## 2018-07-11 ENCOUNTER — Other Ambulatory Visit: Payer: Self-pay

## 2018-07-11 ENCOUNTER — Other Ambulatory Visit (INDEPENDENT_AMBULATORY_CARE_PROVIDER_SITE_OTHER): Payer: Managed Care, Other (non HMO)

## 2018-07-11 DIAGNOSIS — E1165 Type 2 diabetes mellitus with hyperglycemia: Secondary | ICD-10-CM

## 2018-07-11 DIAGNOSIS — Z1329 Encounter for screening for other suspected endocrine disorder: Secondary | ICD-10-CM

## 2018-07-11 DIAGNOSIS — I10 Essential (primary) hypertension: Secondary | ICD-10-CM

## 2018-07-11 DIAGNOSIS — E559 Vitamin D deficiency, unspecified: Secondary | ICD-10-CM | POA: Diagnosis not present

## 2018-07-11 DIAGNOSIS — E785 Hyperlipidemia, unspecified: Secondary | ICD-10-CM

## 2018-07-11 DIAGNOSIS — R748 Abnormal levels of other serum enzymes: Secondary | ICD-10-CM

## 2018-07-11 NOTE — Telephone Encounter (Signed)
Lab orders changed as requested.

## 2018-07-11 NOTE — Addendum Note (Signed)
Addended by: Warden Fillers on: 07/11/2018 10:45 AM   Modules accepted: Orders

## 2018-07-12 LAB — CBC WITH DIFFERENTIAL/PLATELET
Basophils Absolute: 0 10*3/uL (ref 0.0–0.2)
Basos: 1 %
EOS (ABSOLUTE): 0.3 10*3/uL (ref 0.0–0.4)
Eos: 7 %
Hematocrit: 44.6 % (ref 37.5–51.0)
Hemoglobin: 15.2 g/dL (ref 13.0–17.7)
Immature Grans (Abs): 0 10*3/uL (ref 0.0–0.1)
Immature Granulocytes: 1 %
Lymphocytes Absolute: 2.1 10*3/uL (ref 0.7–3.1)
Lymphs: 42 %
MCH: 28.3 pg (ref 26.6–33.0)
MCHC: 34.1 g/dL (ref 31.5–35.7)
MCV: 83 fL (ref 79–97)
Monocytes Absolute: 0.4 10*3/uL (ref 0.1–0.9)
Monocytes: 8 %
Neutrophils Absolute: 2 10*3/uL (ref 1.4–7.0)
Neutrophils: 41 %
Platelets: 334 10*3/uL (ref 150–450)
RBC: 5.37 x10E6/uL (ref 4.14–5.80)
RDW: 12.8 % (ref 11.6–15.4)
WBC: 4.9 10*3/uL (ref 3.4–10.8)

## 2018-07-12 LAB — HEMOGLOBIN A1C
Est. average glucose Bld gHb Est-mCnc: 171 mg/dL
Hgb A1c MFr Bld: 7.6 % — ABNORMAL HIGH (ref 4.8–5.6)

## 2018-07-12 LAB — LIPID PANEL
Chol/HDL Ratio: 5.4 ratio — ABNORMAL HIGH (ref 0.0–5.0)
Cholesterol, Total: 166 mg/dL (ref 100–199)
HDL: 31 mg/dL — ABNORMAL LOW (ref >39)
LDL Calculated: 69 mg/dL (ref 0–99)
Triglycerides: 329 mg/dL — ABNORMAL HIGH (ref 0–149)
VLDL Cholesterol Cal: 66 mg/dL — ABNORMAL HIGH (ref 5–40)

## 2018-07-12 LAB — COMPREHENSIVE METABOLIC PANEL
ALT: 61 IU/L — ABNORMAL HIGH (ref 0–44)
AST: 27 IU/L (ref 0–40)
Albumin/Globulin Ratio: 1.8 (ref 1.2–2.2)
Albumin: 4.4 g/dL (ref 4.1–5.2)
Alkaline Phosphatase: 61 IU/L (ref 39–117)
BUN/Creatinine Ratio: 23 — ABNORMAL HIGH (ref 9–20)
BUN: 18 mg/dL (ref 6–20)
Bilirubin Total: <0.2 mg/dL (ref 0.0–1.2)
CO2: 24 mmol/L (ref 20–29)
Calcium: 9.6 mg/dL (ref 8.7–10.2)
Chloride: 98 mmol/L (ref 96–106)
Creatinine, Ser: 0.79 mg/dL (ref 0.76–1.27)
GFR calc Af Amer: 141 mL/min/{1.73_m2} (ref >59)
GFR calc non Af Amer: 122 mL/min/{1.73_m2} (ref >59)
Globulin, Total: 2.4 g/dL (ref 1.5–4.5)
Glucose: 161 mg/dL — ABNORMAL HIGH (ref 65–99)
Potassium: 4.6 mmol/L (ref 3.5–5.2)
Sodium: 137 mmol/L (ref 134–144)
Total Protein: 6.8 g/dL (ref 6.0–8.5)

## 2018-07-12 LAB — VITAMIN D 25 HYDROXY (VIT D DEFICIENCY, FRACTURES): Vit D, 25-Hydroxy: 48 ng/mL (ref 30.0–100.0)

## 2018-08-04 ENCOUNTER — Ambulatory Visit (INDEPENDENT_AMBULATORY_CARE_PROVIDER_SITE_OTHER): Payer: Managed Care, Other (non HMO) | Admitting: Internal Medicine

## 2018-08-04 ENCOUNTER — Other Ambulatory Visit: Payer: Self-pay

## 2018-08-04 DIAGNOSIS — E559 Vitamin D deficiency, unspecified: Secondary | ICD-10-CM

## 2018-08-04 DIAGNOSIS — E1165 Type 2 diabetes mellitus with hyperglycemia: Secondary | ICD-10-CM

## 2018-08-04 DIAGNOSIS — Z0184 Encounter for antibody response examination: Secondary | ICD-10-CM

## 2018-08-04 DIAGNOSIS — I10 Essential (primary) hypertension: Secondary | ICD-10-CM

## 2018-08-04 DIAGNOSIS — R748 Abnormal levels of other serum enzymes: Secondary | ICD-10-CM | POA: Insufficient documentation

## 2018-08-04 DIAGNOSIS — E785 Hyperlipidemia, unspecified: Secondary | ICD-10-CM

## 2018-08-04 DIAGNOSIS — Z1159 Encounter for screening for other viral diseases: Secondary | ICD-10-CM

## 2018-08-04 MED ORDER — METFORMIN HCL 500 MG PO TABS: 500.0000 mg | ORAL_TABLET | Freq: Two times a day (BID) | ORAL | 3 refills | Status: DC

## 2018-08-04 MED ORDER — HYDROCHLOROTHIAZIDE 12.5 MG PO TABS: 12.5000 mg | ORAL_TABLET | Freq: Every day | ORAL | 3 refills | Status: DC

## 2018-08-04 NOTE — Progress Notes (Signed)
Virtual Visit via Video Note  I connected with Rickey Payne  on 08/04/18 at  3:05 PM EDT by a video enabled telemedicine application and verified that I am speaking with the correct person using two identifiers.  Location patient: home Location provider:work  Persons participating in the virtual visit: patient, provider  I discussed the limitations of evaluation and management by telemedicine and the availability of in person appointments. The patient expressed understanding and agreed to proceed.   HPI: 1. F/u pt needs paperwork for work filled out  2. OSA not on cpap 3. DM 2 improved A1C 7.6 on metformin XR 500 mg qd and jardiance 25 mg qd tolerating  -he has not seen eye MD yet  4. HTN on losartan 100 mg qd and hctz 12.5 mg qd Avg 120-130s/70s-80s weight is stable 430s per pt  5. Vitamin D def taking D3 50K weekly  6. HLD on lipitor 20 mg try ing to exercise and eat well    ROS: See pertinent positives and negatives per HPI.  Past Medical History:  Diagnosis Date  . Asthma    child  . Diabetes mellitus without complication ([place])   . Hyperlipidemia   . Hypertension   . Morbid obesity ([place])   . Sinus tachycardia     Past Surgical History:  Procedure Laterality Date  . right wrist fracture      Family History  Problem Relation Age of Onset  . Hypertension Mother   . Arthritis Mother   . Asthma Mother   . Depression Mother   . Hyperlipidemia Mother   . Diabetes Mother   . Hypertension Father   . Depression Father   . Hyperlipidemia Father   . Intellectual disability Father   . Stroke Father   . Heart disease Maternal Grandfather   . Diabetes Paternal Grandmother   . Drug abuse Paternal Grandmother   . Diabetes Maternal Grandmother     SOCIAL HX:  Diploma, Pharmacologist  Married  Drinks occasionally  Never smoker, no chew  Owns guns, wears seat belt, safe in relationship    Current Outpatient Medications:  .  atorvastatin (LIPITOR) 20 MG  tablet, Take 1 tablet (20 mg total) by mouth daily at 6 PM., Disp: 90 tablet, Rfl: 3 .  Cholecalciferol 1.25 MG (50000 UT) capsule, Take 1 capsule (50,000 Units total) by mouth once a week., Disp: 13 capsule, Rfl: 1 .  clindamycin (CLEOCIN T) 1 % external solution, Apply 1 application topically daily. , Disp: , Rfl:  .  clobetasol (TEMOVATE) 0.05 % external solution, Apply 1 application topically 2 (two) times daily. Bid prn (Patient taking differently: Apply 1 application topically See admin instructions. Apply once daily on Mon, Tues, Wed, Thur, and Fri), Disp: 50 mL, Rfl: 11 .  empagliflozin (JARDIANCE) 25 MG TABS tablet, Take 25 mg by mouth daily., Disp: 90 tablet, Rfl: 3 .  fluticasone (FLONASE) 50 MCG/ACT nasal spray, Place 1 spray into both nostrils daily as needed for allergies or rhinitis., Disp: , Rfl:  .  glucose blood test strip, Use as instructed, Disp: 100 each, Rfl: 12 .  hydrochlorothiazide (HYDRODIURIL) 12.5 MG tablet, Take 1 tablet (12.5 mg total) by mouth daily. In the am, Disp: 90 tablet, Rfl: 3 .  loratadine (CLARITIN) 10 MG tablet, Take 10 mg by mouth daily., Disp: , Rfl:  .  losartan (COZAAR) 100 MG tablet, Take 1 tablet (100 mg total) by mouth daily., Disp: 90 tablet, Rfl: 3 .  ONETOUCH DELICA  LANCETS 33G MISC, 1 Stick by Does not apply route 3 (three) times daily., Disp: 100 each, Rfl: 6 .  metFORMIN (GLUCOPHAGE) 500 MG tablet, Take 1 tablet (500 mg total) by mouth 2 (two) times daily with a meal., Disp: 180 tablet, Rfl: 3 .  phentermine (ADIPEX-P) 37.5 MG tablet, Take 1 tablet (37.5 mg total) by mouth daily before breakfast. (Patient not taking: Reported on 08/04/2018), Disp: 60 tablet, Rfl: 0  EXAM:  VITALS per patient if applicable:  GENERAL: alert, oriented, appears well and in no acute distress  HEENT: atraumatic, conjunttiva clear, no obvious abnormalities on inspection of external nose and ears  NECK: normal movements of the head and neck  LUNGS: on inspection  no signs of respiratory distress, breathing rate appears normal, no obvious gross SOB, gasping or wheezing  CV: no obvious cyanosis  MS: moves all visible extremities without noticeable abnormality  PSYCH/NEURO: pleasant and cooperative, no obvious depression or anxiety, speech and thought processing grossly intact  ASSESSMENT AND PLAN:  Discussed the following assessment and plan:  Type 2 diabetes mellitus with hyperglycemia, without long-term current use of insulin (HCC) A1C 7.6- Plan: metFORMIN (GLUCOPHAGE) 500 MG tablet mg bid with food d/c XR 500 mg qd, cont jardiance 25 mg qd  If not improving at repeat consider Ozempic/Trulicity  -consider eye MD in future  -urine due 03/02/19 -on statin and ARB  Essential hypertension - Plan: hydrochlorothiazide (HYDRODIURIL) 12.5 MG tablet on losartan 100 mg qd   Vitamin D deficiency - Plan: cont D3 50K  IU weekly   Morbid obesity ([place]) - Plan: rec healthy diet and exercise   Hyperlipidemia, unspecified hyperlipidemia type - Plan: monitor lipitor 20 mg qhs  Consider lipitor 40 mg qhs in the future  Elevated lfts  -if continues consider US abdomen    HM Declines flu shot Tdap had 02/28/08 per  -ptwill do at f/u  Consider pna 23 vx  Had 1/2 hep A vaccines Had 2/2 hep B vaccines  Had 1 MMR since not immune   rec healthy diet and exercise  Waist circumference is 61 inches   Reviewed derm note 05/16/17 given clindamycin 1% lotion and halobetasol topical foam 0.05%  Dr. Nehemiah Massed   I discussed the assessment and treatment plan with the patient. The patient was provided an opportunity to ask questions and all were answered. The patient agreed with the plan and demonstrated an understanding of the instructions.   The patient was advised to call back or seek an in-person evaluation if the symptoms worsen or if the condition fails to improve as anticipated.  Time spent 15 minutes Delorise Jackson, MD

## 2018-12-07 ENCOUNTER — Other Ambulatory Visit: Payer: Managed Care, Other (non HMO)

## 2018-12-12 ENCOUNTER — Other Ambulatory Visit: Payer: Managed Care, Other (non HMO)

## 2018-12-14 ENCOUNTER — Ambulatory Visit: Payer: Managed Care, Other (non HMO) | Admitting: Internal Medicine

## 2019-01-09 ENCOUNTER — Other Ambulatory Visit: Payer: Self-pay | Admitting: Internal Medicine

## 2019-01-09 DIAGNOSIS — I10 Essential (primary) hypertension: Secondary | ICD-10-CM

## 2019-01-09 MED ORDER — LOSARTAN POTASSIUM 100 MG PO TABS: 100.0000 mg | ORAL_TABLET | Freq: Every day | ORAL | 3 refills | Status: DC

## 2019-02-15 ENCOUNTER — Other Ambulatory Visit: Payer: Managed Care, Other (non HMO)

## 2019-02-20 ENCOUNTER — Ambulatory Visit: Payer: Managed Care, Other (non HMO) | Admitting: Internal Medicine

## 2019-03-10 ENCOUNTER — Encounter: Payer: Self-pay | Admitting: Internal Medicine

## 2019-03-11 ENCOUNTER — Other Ambulatory Visit: Payer: Self-pay | Admitting: *Deleted

## 2019-03-11 DIAGNOSIS — I10 Essential (primary) hypertension: Secondary | ICD-10-CM

## 2019-03-11 MED ORDER — HYDROCHLOROTHIAZIDE 12.5 MG PO TABS: 12.5000 mg | ORAL_TABLET | Freq: Every day | ORAL | 0 refills | Status: DC

## 2019-03-12 ENCOUNTER — Telehealth: Payer: Self-pay | Admitting: Internal Medicine

## 2019-03-12 NOTE — Telephone Encounter (Signed)
I called pt and left a vm to call ofc to resch. 

## 2019-03-20 LAB — HM DIABETES EYE EXAM

## 2019-03-26 ENCOUNTER — Encounter: Payer: Self-pay | Admitting: Internal Medicine

## 2019-03-29 ENCOUNTER — Encounter: Payer: Self-pay | Admitting: Internal Medicine

## 2019-03-29 ENCOUNTER — Other Ambulatory Visit: Payer: Self-pay | Admitting: Internal Medicine

## 2019-03-29 DIAGNOSIS — E785 Hyperlipidemia, unspecified: Secondary | ICD-10-CM

## 2019-03-29 DIAGNOSIS — E119 Type 2 diabetes mellitus without complications: Secondary | ICD-10-CM

## 2019-03-29 DIAGNOSIS — E1165 Type 2 diabetes mellitus with hyperglycemia: Secondary | ICD-10-CM

## 2019-03-29 MED ORDER — METFORMIN HCL 500 MG PO TABS: 500.0000 mg | ORAL_TABLET | Freq: Two times a day (BID) | ORAL | 3 refills | Status: DC

## 2019-03-29 MED ORDER — ATORVASTATIN CALCIUM 20 MG PO TABS: 20.0000 mg | ORAL_TABLET | Freq: Every day | ORAL | 3 refills | Status: DC

## 2019-03-29 MED ORDER — JARDIANCE 25 MG PO TABS: 25.0000 mg | ORAL_TABLET | Freq: Every day | ORAL | 3 refills | Status: DC

## 2019-04-05 ENCOUNTER — Other Ambulatory Visit: Payer: Managed Care, Other (non HMO)

## 2019-04-11 ENCOUNTER — Ambulatory Visit: Payer: Managed Care, Other (non HMO) | Admitting: Internal Medicine

## 2019-05-05 ENCOUNTER — Encounter: Payer: Self-pay | Admitting: Internal Medicine

## 2019-05-09 ENCOUNTER — Ambulatory Visit: Payer: Managed Care, Other (non HMO) | Admitting: Internal Medicine

## 2019-06-07 ENCOUNTER — Encounter: Payer: Managed Care, Other (non HMO) | Admitting: Internal Medicine

## 2019-06-14 ENCOUNTER — Encounter: Payer: Managed Care, Other (non HMO) | Admitting: Internal Medicine

## 2019-06-20 ENCOUNTER — Ambulatory Visit (INDEPENDENT_AMBULATORY_CARE_PROVIDER_SITE_OTHER): Payer: Managed Care, Other (non HMO) | Admitting: Internal Medicine

## 2019-06-20 ENCOUNTER — Encounter: Payer: Managed Care, Other (non HMO) | Admitting: Internal Medicine

## 2019-06-20 ENCOUNTER — Encounter: Payer: Self-pay | Admitting: Internal Medicine

## 2019-06-20 ENCOUNTER — Other Ambulatory Visit: Payer: Self-pay

## 2019-06-20 ENCOUNTER — Telehealth: Payer: Self-pay | Admitting: Internal Medicine

## 2019-06-20 VITALS — BP 130/82 | HR 100 | Temp 97.1°F | Ht 73.23 in | Wt >= 6400 oz

## 2019-06-20 DIAGNOSIS — Z0000 Encounter for general adult medical examination without abnormal findings: Secondary | ICD-10-CM | POA: Diagnosis not present

## 2019-06-20 DIAGNOSIS — E119 Type 2 diabetes mellitus without complications: Secondary | ICD-10-CM

## 2019-06-20 DIAGNOSIS — Z1159 Encounter for screening for other viral diseases: Secondary | ICD-10-CM

## 2019-06-20 DIAGNOSIS — Z1329 Encounter for screening for other suspected endocrine disorder: Secondary | ICD-10-CM | POA: Diagnosis not present

## 2019-06-20 DIAGNOSIS — I10 Essential (primary) hypertension: Secondary | ICD-10-CM

## 2019-06-20 DIAGNOSIS — Z6841 Body Mass Index (BMI) 40.0 and over, adult: Secondary | ICD-10-CM

## 2019-06-20 DIAGNOSIS — Z0184 Encounter for antibody response examination: Secondary | ICD-10-CM

## 2019-06-20 DIAGNOSIS — E785 Hyperlipidemia, unspecified: Secondary | ICD-10-CM

## 2019-06-20 MED ORDER — OZEMPIC (0.25 OR 0.5 MG/DOSE) 2 MG/1.5ML ~~LOC~~ SOPN: 0.2500 mg | PEN_INJECTOR | SUBCUTANEOUS | 12 refills | Status: DC

## 2019-06-20 MED ORDER — PEN NEEDLES 30G X 8 MM MISC: 1.0000 | 11 refills | Status: DC

## 2019-06-20 NOTE — Telephone Encounter (Signed)
Rickey Payne has been informed that PCP stated it will be ok to give patient 31g.

## 2019-06-20 NOTE — Patient Instructions (Addendum)
Dr. Ovidio Kin Weight loss surgery  ozempic 1x per week  Stop Jardiance due to cost   Semaglutide injection solution What is this medicine? SEMAGLUTIDE (Sem a GLOO tide) is used to improve blood sugar control in adults with type 2 diabetes. This medicine may be used with other diabetes medicines. This drug may also reduce the risk of heart attack or stroke if you have type 2 diabetes and risk factors for heart disease. This medicine may be used for other purposes; ask your health care provider or pharmacist if you have questions. COMMON BRAND NAME(S): OZEMPIC What should I tell my health care provider before I take this medicine? They need to know if you have any of these conditions:  endocrine tumors (MEN 2) or if someone in your family had these tumors  eye disease, vision problems  history of pancreatitis  kidney disease  stomach problems  thyroid cancer or if someone in your family had thyroid cancer  an unusual or allergic reaction to semaglutide, other medicines, foods, dyes, or preservatives  pregnant or trying to get pregnant  breast-feeding How should I use this medicine? This medicine is for injection under the skin of your upper leg (thigh), stomach area, or upper arm. It is given once every week (every 7 days). You will be taught how to prepare and give this medicine. Use exactly as directed. Take your medicine at regular intervals. Do not take it more often than directed. If you use this medicine with insulin, you should inject this medicine and the insulin separately. Do not mix them together. Do not give the injections right next to each other. Change (rotate) injection sites with each injection. It is important that you put your used needles and syringes in a special sharps container. Do not put them in a trash can. If you do not have a sharps container, call your pharmacist or healthcare provider to get one. A special MedGuide will be given to you by the pharmacist  with each prescription and refill. Be sure to read this information carefully each time. This drug comes with INSTRUCTIONS FOR USE. Ask your pharmacist for directions on how to use this drug. Read the information carefully. Talk to your pharmacist or health care provider if you have questions. Talk to your pediatrician regarding the use of this medicine in children. Special care may be needed. Overdosage: If you think you have taken too much of this medicine contact a poison control center or emergency room at once. NOTE: This medicine is only for you. Do not share this medicine with others. What if I miss a dose? If you miss a dose, take it as soon as you can within 5 days after the missed dose. Then take your next dose at your regular weekly time. If it has been longer than 5 days after the missed dose, do not take the missed dose. Take the next dose at your regular time. Do not take double or extra doses. If you have questions about a missed dose, contact your health care provider for advice. What may interact with this medicine?  other medicines for diabetes Many medications may cause changes in blood sugar, these include:  alcohol containing beverages  antiviral medicines for HIV or AIDS  aspirin and aspirin-like drugs  certain medicines for blood pressure, heart disease, irregular heart beat  chromium  diuretics  male hormones, such as estrogens or progestins, birth control pills  fenofibrate  gemfibrozil  isoniazid  lanreotide  male hormones or  anabolic steroids  MAOIs like Carbex, Eldepryl, Marplan, Nardil, and Parnate  medicines for weight loss  medicines for allergies, asthma, cold, or cough  medicines for depression, anxiety, or psychotic disturbances  niacin  nicotine  NSAIDs, medicines for pain and inflammation, like ibuprofen or naproxen  octreotide  pasireotide  pentamidine  phenytoin  probenecid  quinolone antibiotics such as  ciprofloxacin, levofloxacin, ofloxacin  some herbal dietary supplements  steroid medicines such as prednisone or cortisone  sulfamethoxazole; trimethoprim  thyroid hormones Some medications can hide the warning symptoms of low blood sugar (hypoglycemia). You may need to monitor your blood sugar more closely if you are taking one of these medications. These include:  beta-blockers, often used for high blood pressure or heart problems (examples include atenolol, metoprolol, propranolol)  clonidine  guanethidine  reserpine This list may not describe all possible interactions. Give your health care provider a list of all the medicines, herbs, non-prescription drugs, or dietary supplements you use. Also tell them if you smoke, drink alcohol, or use illegal drugs. Some items may interact with your medicine. What should I watch for while using this medicine? Visit your doctor or health care professional for regular checks on your progress. Drink plenty of fluids while taking this medicine. Check with your doctor or health care professional if you get an attack of severe diarrhea, nausea, and vomiting. The loss of too much body fluid can make it dangerous for you to take this medicine. A test called the HbA1C (A1C) will be monitored. This is a simple blood test. It measures your blood sugar control over the last 2 to 3 months. You will receive this test every 3 to 6 months. Learn how to check your blood sugar. Learn the symptoms of low and high blood sugar and how to manage them. Always carry a quick-source of sugar with you in case you have symptoms of low blood sugar. Examples include hard sugar candy or glucose tablets. Make sure others know that you can choke if you eat or drink when you develop serious symptoms of low blood sugar, such as seizures or unconsciousness. They must get medical help at once. Tell your doctor or health care professional if you have high blood sugar. You might need to  change the dose of your medicine. If you are sick or exercising more than usual, you might need to change the dose of your medicine. Do not skip meals. Ask your doctor or health care professional if you should avoid alcohol. Many nonprescription cough and cold products contain sugar or alcohol. These can affect blood sugar. Pens should never be shared. Even if the needle is changed, sharing may result in passing of viruses like hepatitis or HIV. Wear a medical ID bracelet or chain, and carry a card that describes your disease and details of your medicine and dosage times. Do not become pregnant while taking this medicine. Women should inform their doctor if they wish to become pregnant or think they might be pregnant. There is a potential for serious side effects to an unborn child. Talk to your health care professional or pharmacist for more information. What side effects may I notice from receiving this medicine? Side effects that you should report to your doctor or health care professional as soon as possible:  allergic reactions like skin rash, itching or hives, swelling of the face, lips, or tongue  breathing problems  changes in vision  diarrhea that continues or is severe  lump or swelling on the neck  severe nausea  signs and symptoms of infection like fever or chills; cough; sore throat; pain or trouble passing urine  signs and symptoms of low blood sugar such as feeling anxious, confusion, dizziness, increased hunger, unusually weak or tired, sweating, shakiness, cold, irritable, headache, blurred vision, fast heartbeat, loss of consciousness  signs and symptoms of kidney injury like trouble passing urine or change in the amount of urine  trouble swallowing  unusual stomach upset or pain  vomiting Side effects that usually do not require medical attention (report to your doctor or health care professional if they continue or are  bothersome):  constipation  diarrhea  nausea  pain, redness, or irritation at site where injected  stomach upset This list may not describe all possible side effects. Call your doctor for medical advice about side effects. You may report side effects to FDA at 1-800-FDA-1088. Where should I keep my medicine? Keep out of the reach of children. Store unopened pens in a refrigerator between 2 and 8 degrees C (36 and 46 degrees F). Do not freeze. Protect from light and heat. After you first use the pen, it can be stored for 56 days at room temperature between 15 and 30 degrees C (59 and 86 degrees F) or in a refrigerator. Throw away your used pen after 56 days or after the expiration date, whichever comes first. Do not store your pen with the needle attached. If the needle is left on, medicine may leak from the pen. NOTE: This sheet is a summary. It may not cover all possible information. If you have questions about this medicine, talk to your doctor, pharmacist, or health care provider.  2020 Elsevier/Gold Standard (2018-10-10 09:41:51)

## 2019-06-20 NOTE — Progress Notes (Signed)
Chief Complaint  Patient presents with  . Annual Exam   Annual  1. DM 2 last A1C 7.6 was on jardiance 25 but insurance would not cover $700 will change to ozempic on metformin 500 mg bid  2. Morbid obesity with HLD, DM2, HTN tried adipex in the past but had increased sweating, he has seen nutrition and tried diet and exercise since 2019 and weight is increasing disc wt loss surgery and agreeable today  3. Suspected osa and tonsillar lesion never had sleep study done he denies fatigue and never had surgery with ENT remove tonsils and currently does not plan to 4. HTN on hctz 12.5 mg qd losartan 100 mg qd BP sl elevated but improved and HLD on lipitor 20 mg qd  Review of Systems  Constitutional: Negative for weight loss.  HENT: Negative for hearing loss.   Eyes: Negative for blurred vision.  Respiratory: Negative for shortness of breath.   Cardiovascular: Negative for chest pain.  Gastrointestinal: Negative for abdominal pain.  Musculoskeletal: Negative for falls.  Skin: Negative for rash.  Neurological: Negative for headaches.  Psychiatric/Behavioral: Negative for depression.   Past Medical History:  Diagnosis Date  . Asthma    child  . Diabetes mellitus without complication ([place])   . Hyperlipidemia   . Hypertension   . Morbid obesity ([place])   . Sinus tachycardia    Past Surgical History:  Procedure Laterality Date  . right wrist fracture     Family History  Problem Relation Age of Onset  . Hypertension Mother   . Arthritis Mother   . Asthma Mother   . Depression Mother   . Hyperlipidemia Mother   . Diabetes Mother   . Hypertension Father   . Depression Father   . Hyperlipidemia Father   . Intellectual disability Father   . Stroke Father   . Heart disease Maternal Grandfather   . Diabetes Paternal Grandmother   . Drug abuse Paternal Grandmother   . Diabetes Maternal Grandmother    Social History   Socioeconomic History  . Marital status: Married    Spouse name:  Not on file  . Number of children: Not on file  . Years of education: Not on file  . Highest education level: Not on file  Occupational History  . Not on file  Tobacco Use  . Smoking status: Never Smoker  . Smokeless tobacco: Never Used  Substance and Sexual Activity  . Alcohol use: Yes    Alcohol/week: 1.0 standard drinks    Types: 1 Cans of beer per week    Comment: occasional  . Drug use: No  . Sexual activity: Yes  Other Topics Concern  . Not on file  Social History Narrative   Diploma, Pharmacologist    Married    2 kids 1 y.o boy, 16 month boy, wife pregnant as of 06/20/19    Drinks occasionally    Never smoker, no chew    Owns guns, wears seat belt, safe in relationship       Social Determinants of Health   Financial Resource Strain:   . Difficulty of Paying Living Expenses:   Food Insecurity:   . Worried About Charity fundraiser in the Last Year:   . Arboriculturist in the Last Year:   Transportation Needs:   . Film/video editor (Medical):   Marland Kitchen Lack of Transportation (Non-Medical):   Physical Activity:   . Days of Exercise per Week:   .  Minutes of Exercise per Session:   Stress:   . Feeling of Stress :   Social Connections:   . Frequency of Communication with Friends and Family:   . Frequency of Social Gatherings with Friends and Family:   . Attends Religious Services:   . Active Member of Clubs or Organizations:   . Attends Club or Organization Meetings:   . Marital Status:   Intimate Partner Violence:   . Fear of Current or Ex-Partner:   . Emotionally Abused:   . Physically Abused:   . Sexually Abused:    Current Meds  Medication Sig  . atorvastatin (LIPITOR) 20 MG tablet Take 1 tablet (20 mg total) by mouth daily at 6 PM.  . fluticasone (FLONASE) 50 MCG/ACT nasal spray Place 1 spray into both nostrils daily as needed for allergies or rhinitis.  . glucose blood test strip Use as instructed  . hydrochlorothiazide (HYDRODIURIL) 12.5 MG  tablet Take 1 tablet (12.5 mg total) by mouth daily. In the am  . loratadine (CLARITIN) 10 MG tablet Take 10 mg by mouth daily.  . losartan (COZAAR) 100 MG tablet Take 1 tablet (100 mg total) by mouth daily.  . metFORMIN (GLUCOPHAGE) 500 MG tablet Take 1 tablet (500 mg total) by mouth 2 (two) times daily with a meal.  . ONETOUCH DELICA LANCETS 33G MISC 1 Stick by Does not apply route 3 (three) times daily.   No Known Allergies No results found for this or any previous visit (from the past 2160 hour(s)). Objective  Body mass index is 59.13 kg/m. Wt Readings from Last 3 Encounters:  06/20/19 (!) 451 lb (204.6 kg)  04/05/18 (!) 434 lb 12.8 oz (197.2 kg)  01/25/18 (!) 446 lb 6.4 oz (202.5 kg)   Temp Readings from Last 3 Encounters:  06/20/19 (!) 97.1 F (36.2 C) (Temporal)  04/05/18 98.6 F (37 C) (Oral)  01/25/18 98.1 F (36.7 C) (Oral)   BP Readings from Last 3 Encounters:  06/20/19 130/82  04/05/18 122/86  01/25/18 130/86   Pulse Readings from Last 3 Encounters:  06/20/19 100  04/05/18 (!) 111  01/25/18 97    Physical Exam Vitals and nursing note reviewed.  Constitutional:      Appearance: Normal appearance. He is well-developed and well-groomed. He is morbidly obese.  HENT:     Head: Normocephalic and atraumatic.  Eyes:     Conjunctiva/sclera: Conjunctivae normal.     Pupils: Pupils are equal, round, and reactive to light.  Cardiovascular:     Rate and Rhythm: Normal rate and regular rhythm.     Heart sounds: Normal heart sounds. No murmur.  Pulmonary:     Effort: Pulmonary effort is normal.     Breath sounds: Normal breath sounds.  Skin:    General: Skin is warm and dry.  Neurological:     General: No focal deficit present.     Mental Status: He is alert and oriented to person, place, and time. Mental status is at baseline.     Gait: Gait normal.  Psychiatric:        Attention and Perception: Attention and perception normal.        Mood and Affect: Mood and  affect normal.        Speech: Speech normal.        Behavior: Behavior normal. Behavior is cooperative.        Thought Content: Thought content normal.        Cognition and Memory: Cognition normal.          Judgment: Judgment normal.     Assessment  Plan  Annual physical exam Declines flu shot Tdap had 02/28/08 per  -ptwill do at f/u  Consider pna 23 vx, covid 19 vx  Had 2/2 hep B vaccines check titer today rec MMR in future not immune  rec healthy diet and exercise  Waist circumference is ~61 inches   Reviewed derm note 05/16/17 given clindamycin 1% lotion and halobetasol topical foam 0.05%  Dr. Nehemiah Massed  Essential hypertension - Plan: CBC with Differential/Platelet, Comprehensive metabolic panel, Lipid panel, Amb Referral to Bariatric Surgery Cont meds   Type 2 diabetes mellitus without complication, without long-term current use of insulin (HCC) - Plan: Semaglutide,0.25 or 0.5MG/DOS, (OZEMPIC, 0.25 OR 0.5 MG/DOSE,) 2 MG/1.5ML SOPN, Insulin Pen Needle (PEN NEEDLES) 30G X 8 MM MISC, Comprehensive metabolic panel, Hemoglobin A1c, Lipid panel, Urinalysis, Routine w reflex microscopic, Microalbumin / creatinine urine ratio, Amb Referral to Bariatric Surgery Eye exam 03/2019 normal  Foot exam do at f/u   Morbid obesity with BMI of 50.0-59.9, adult ([place]) - Plan: Amb Referral to Bariatric Surgery   Provider: Dr. Olivia Mackie [name]-Internal Medicine

## 2019-06-20 NOTE — Telephone Encounter (Signed)
Rickey Payne with [facility] called and asked about Insulin Pen Needle (PEN NEEDLES) 30G X 8 MM MISC. He states that they only have 31G and asked if that would be ok?

## 2019-06-20 NOTE — Telephone Encounter (Signed)
Pt said he received a call from our office and was wondering if it was about his prescription that [facility] is saying they don't have?

## 2019-06-21 LAB — COMPREHENSIVE METABOLIC PANEL
ALT: 73 IU/L — ABNORMAL HIGH (ref 0–44)
AST: 41 IU/L — ABNORMAL HIGH (ref 0–40)
Albumin/Globulin Ratio: 1.7 (ref 1.2–2.2)
Albumin: 4.5 g/dL (ref 4.1–5.2)
Alkaline Phosphatase: 68 IU/L (ref 39–117)
BUN/Creatinine Ratio: 17 (ref 9–20)
BUN: 11 mg/dL (ref 6–20)
Bilirubin Total: 0.3 mg/dL (ref 0.0–1.2)
CO2: 28 mmol/L (ref 20–29)
Calcium: 9.3 mg/dL (ref 8.7–10.2)
Chloride: 96 mmol/L (ref 96–106)
Creatinine, Ser: 0.63 mg/dL — ABNORMAL LOW (ref 0.76–1.27)
GFR calc Af Amer: 154 mL/min/{1.73_m2} (ref >59)
GFR calc non Af Amer: 133 mL/min/{1.73_m2} (ref >59)
Globulin, Total: 2.7 g/dL (ref 1.5–4.5)
Glucose: 172 mg/dL — ABNORMAL HIGH (ref 65–99)
Potassium: 4.5 mmol/L (ref 3.5–5.2)
Sodium: 139 mmol/L (ref 134–144)
Total Protein: 7.2 g/dL (ref 6.0–8.5)

## 2019-06-21 LAB — CBC WITH DIFFERENTIAL/PLATELET
Basophils Absolute: 0 10*3/uL (ref 0.0–0.2)
Basos: 1 %
EOS (ABSOLUTE): 0.3 10*3/uL (ref 0.0–0.4)
Eos: 6 %
Hematocrit: 43.2 % (ref 37.5–51.0)
Hemoglobin: 14.5 g/dL (ref 13.0–17.7)
Immature Grans (Abs): 0 10*3/uL (ref 0.0–0.1)
Immature Granulocytes: 1 %
Lymphocytes Absolute: 1.9 10*3/uL (ref 0.7–3.1)
Lymphs: 38 %
MCH: 28.4 pg (ref 26.6–33.0)
MCHC: 33.6 g/dL (ref 31.5–35.7)
MCV: 85 fL (ref 79–97)
Monocytes Absolute: 0.3 10*3/uL (ref 0.1–0.9)
Monocytes: 7 %
Neutrophils Absolute: 2.4 10*3/uL (ref 1.4–7.0)
Neutrophils: 47 %
Platelets: 313 10*3/uL (ref 150–450)
RBC: 5.11 x10E6/uL (ref 4.14–5.80)
RDW: 13.1 % (ref 11.6–15.4)
WBC: 4.9 10*3/uL (ref 3.4–10.8)

## 2019-06-21 LAB — LIPID PANEL
Chol/HDL Ratio: 5 ratio (ref 0.0–5.0)
Cholesterol, Total: 210 mg/dL — ABNORMAL HIGH (ref 100–199)
HDL: 42 mg/dL (ref >39)
LDL Chol Calc (NIH): 123 mg/dL — ABNORMAL HIGH (ref 0–99)
Triglycerides: 258 mg/dL — ABNORMAL HIGH (ref 0–149)
VLDL Cholesterol Cal: 45 mg/dL — ABNORMAL HIGH (ref 5–40)

## 2019-06-21 LAB — URINALYSIS, ROUTINE W REFLEX MICROSCOPIC
Bilirubin, UA: NEGATIVE
Glucose, UA: NEGATIVE
Ketones, UA: NEGATIVE
Leukocytes,UA: NEGATIVE
Nitrite, UA: NEGATIVE
Protein,UA: NEGATIVE
RBC, UA: NEGATIVE
Specific Gravity, UA: 1.016 (ref 1.005–1.030)
Urobilinogen, Ur: 0.2 mg/dL (ref 0.2–1.0)
pH, UA: 6.5 (ref 5.0–7.5)

## 2019-06-21 LAB — HEPATITIS B SURFACE ANTIBODY, QUANTITATIVE: Hepatitis B Surf Ab Quant: >1000 m[IU]/mL (ref >9.9)

## 2019-06-21 LAB — MICROALBUMIN / CREATININE URINE RATIO
Creatinine, Urine: 82.1 mg/dL
Microalb/Creat Ratio: 48 mg/g creat — ABNORMAL HIGH (ref 0–29)
Microalbumin, Urine: 39.1 ug/mL

## 2019-06-21 LAB — HEMOGLOBIN A1C
Est. average glucose Bld gHb Est-mCnc: 203 mg/dL
Hgb A1c MFr Bld: 8.7 % — ABNORMAL HIGH (ref 4.8–5.6)

## 2019-06-21 LAB — TSH: TSH: 1.42 u[IU]/mL (ref 0.450–4.500)

## 2019-06-22 ENCOUNTER — Telehealth: Payer: Self-pay | Admitting: Internal Medicine

## 2019-06-22 DIAGNOSIS — E119 Type 2 diabetes mellitus without complications: Secondary | ICD-10-CM

## 2019-06-22 MED ORDER — TRULICITY 0.75 MG/0.5ML ~~LOC~~ SOAJ: 0.7500 mg | SUBCUTANEOUS | 2 refills | Status: DC

## 2019-06-22 NOTE — Telephone Encounter (Signed)
Patient informed and verbalized understanding.  Medication sent in to preferred pharmacy.   Patient has a meter and will continue to check his sugars morning and night. Patient was driving when I called. He is scheduled to come back in 6 months.   Patient will call back in to reschedule for a 3-6 month follow up. Needing to be seen end of August or beginning of September.

## 2019-06-22 NOTE — Telephone Encounter (Signed)
Received notice from patient's insurance that they rather [place] try upping the patient's metformin dose or Trulicity before initiating PA for Ozempic.   Spoke with Dr French Ana ans she would rather the patient take the Trulicity and check his sugars for the next couple of weeks to see how they are doing. Also patient will need a follow up in the next 3-4 months.

## 2019-06-22 NOTE — Telephone Encounter (Signed)
Patient calling I to check on the status of his Ozempic medication. Informed the Patient that this is needing a prior auth and we are initiating this.   Prior authorization has been submitted for patient's Ozempic.  Awaiting approval or denial.

## 2019-06-29 ENCOUNTER — Telehealth: Payer: Self-pay | Admitting: Internal Medicine

## 2019-06-29 NOTE — Telephone Encounter (Signed)
Pt returned your call.  

## 2019-06-29 NOTE — Telephone Encounter (Signed)
-----   Message from Bevelyn Buckles, MD sent at 06/22/2019  5:09 PM EDT ----- Blood cts normal   Liver enzymes elevated  -does he want me to order US liver?   Cholesterol elevated  -is he taking his lipitor?   A1C 8.7  -sent trulicity 0.75 1x per week x 1 month and increase to 1.5 after 1 month 1x/week call me back when you do this so I can send the new dose Goal 90-130 in the am and <180 2 hours after meals   Thyroid lab normal   Hep B protected   Urine with protein we need to control diabetes

## 2019-06-29 NOTE — Telephone Encounter (Signed)
Pt called said that the pharmacy said that his insurance would not cover the new medication.

## 2019-06-29 NOTE — Telephone Encounter (Signed)
Prior authorization has been submitted for patient's Trulicity.  Awaiting approval or denial.

## 2019-06-29 NOTE — Telephone Encounter (Signed)
Patient informed and verbalized understanding.   Patient declines the [place] for now and will give [place] a call back.  Patient states that he has been missing the Lipitor doses but is now on a set shift and will start taking this daily.   He will check with his pharmacy on picking up the Trulicity.

## 2019-07-02 ENCOUNTER — Other Ambulatory Visit: Payer: Self-pay | Admitting: Internal Medicine

## 2019-07-02 NOTE — Telephone Encounter (Signed)
Was he able to pick up trulicity?  He will need to inject 1x per month to abdomen and increase to 1.5 after 1 month  Ask his pharmacist to show how to do this  Clean skin with alcohol before injecting in abdomen and rotate sites   TMS

## 2019-07-04 NOTE — Telephone Encounter (Signed)
Noted  Arianna pt needs to call us back if has problems getting trulicity to come up with new plan   Jonn Shingles can you check on bariatric surgery referrals for this patient?   Thanks [employer]

## 2019-07-04 NOTE — Telephone Encounter (Signed)
Patient informed to call back in if the coupon card does not work. If this does not go through we will discuss other options.

## 2019-07-04 NOTE — Telephone Encounter (Signed)
Patient states that his insurance called him to inform the medication had been approved. He states that the pharmacy is still saying it needs approval.  Will call the pharmacy to check on the medication.   The pharmacy states that the amount for the medication even with insurance is [phone] due to his deductible. Patient has private [employer]. Will inform him of the discount cards.   Informed the patient of the cost and walked him through the process of getting a coupon card off of the [employer]. He has been emailed a card and will call the pharmacy to see what the price will now be.   Patient states no one from surgery has called him yet. I have given him the number to call Dr Allene Pyo office.

## 2019-07-05 ENCOUNTER — Encounter: Payer: Self-pay | Admitting: Internal Medicine

## 2019-07-05 NOTE — Telephone Encounter (Signed)
Would you like to refer to Catie?

## 2019-07-06 NOTE — Addendum Note (Signed)
Addended by: Quentin Ore on: 07/06/2019 07:08 PM   Modules accepted: Orders

## 2019-07-06 NOTE — Telephone Encounter (Signed)
Coupon card did not work. See Patient message encounter 07/05/19

## 2019-07-06 NOTE — Telephone Encounter (Signed)
Prior authorization has been approved.  See Patient message encounter from 07/05/19

## 2019-07-10 ENCOUNTER — Telehealth: Payer: Self-pay | Admitting: Internal Medicine

## 2019-07-10 ENCOUNTER — Ambulatory Visit: Payer: Self-pay | Admitting: Pharmacist

## 2019-07-10 DIAGNOSIS — E119 Type 2 diabetes mellitus without complications: Secondary | ICD-10-CM

## 2019-07-10 NOTE — Chronic Care Management (AMB) (Signed)
  Care Management   Note  07/10/2019 Name: Rickey Payne MRN: [id number] DOB: 14-Feb-1990  Rickey Payne is a 29 y.o. year old male who is a primary care patient of [name], [name], MD. I reached out to Rickey Payne by phone today in response to a referral sent by Mr. Moussa Wiegand Rewerts's health plan.    Mr. Furches was given information about care management services today including:  1. Care management services include personalized support from designated clinical staff supervised by his physician, including individualized plan of care and coordination with other care providers 2. 24/7 contact phone numbers for assistance for urgent and routine care needs. 3. The patient may stop care management services at any time by phone call to the office staff.  Patient agreed to services and verbal consent obtained.   Follow up plan: Telephone appointment with care management team member scheduled for:07/27/2019  Elisha Ponder, LPN Health Advisor, Embedded Care Coordination [facility] Health Care Management ??[email or web address] ??[phone]

## 2019-07-10 NOTE — Chronic Care Management (AMB) (Signed)
Chronic Care Management   Note  07/10/2019 Name: Rickey Payne MRN: [id number] DOB: Dec 29, 1990   Subjective:  Rickey Payne is a 29 y.o. year old male who is a primary care patient of [name], Nino Glow, MD. The CCM team was consulted for assistance with chronic disease management and care coordination needs.    Contacted patient for medication management review.   Review of patient status, including review of consultants reports, laboratory and other test data, was performed as part of comprehensive evaluation and provision of chronic care management services.   SDOH (Social Determinants of Health) assessments and interventions performed:  yes  Objective:  Lab Results  Component Value Date   CREATININE 0.63 (L) 06/20/2019   CREATININE 0.79 07/11/2018   CREATININE 0.75 03/01/2018    Lab Results  Component Value Date   HGBA1C 8.7 (H) 06/20/2019       Component Value Date/Time   CHOL 210 (H) 06/20/2019 1056   TRIG 258 (H) 06/20/2019 1056   HDL 42 06/20/2019 1056   CHOLHDL 5.0 06/20/2019 1056   CHOLHDL 6 03/01/2018 1549   LDLCALC 123 (H) 06/20/2019 1056   LDLDIRECT 88.0 03/01/2018 1549    Clinical ASCVD: No  The ASCVD Risk score Mikey Bussing DC Jr., et al., 2013) failed to calculate for the following reasons:   The 2013 ASCVD risk score is only valid for ages 60 to 95    BP Readings from Last 3 Encounters:  06/20/19 130/82  04/05/18 122/86  01/25/18 130/86    No Known Allergies  Medications Reviewed Today    Reviewed by De Hollingshead, [facility] (Pharmacist) on 07/10/19 at 1455  Med List Status: <None>  Medication Order Taking? Sig Documenting Provider Last Dose Status Informant  atorvastatin (LIPITOR) 20 MG tablet [id number] Yes Take 1 tablet (20 mg total) by mouth daily at 6 PM. [name], Nino Glow, MD Taking Active   Dulaglutide (TRULICITY) 9.16 BW/4.6KZ SOPN [id number] Yes Inject 0.75 mg into the skin once a week. Increase to 1.5 mg after 1 month and call  me and let me know If blood sugars am are >130 and >180 after meals [name], Nino Glow, MD Taking Active   fluticasone (FLONASE) 50 MCG/ACT nasal spray [id number] Yes Place 1 spray into both nostrils daily as needed for allergies or rhinitis. [provider] Taking Active Self  glucose blood test strip [id number]  Use as instructed [name], Nino Glow, MD  Active Self  hydrochlorothiazide (HYDRODIURIL) 12.5 MG tablet [id number] Yes Take 1 tablet (12.5 mg total) by mouth daily. In the am [name], Nino Glow, MD Taking Active   Insulin Pen Needle (PEN NEEDLES) 30G X 8 MM MISC [id number] Yes 1 Device by Does not apply route once a week. [name], Nino Glow, MD Taking Active   loratadine (CLARITIN) 10 MG tablet [id number] Yes Take 10 mg by mouth daily. [provider] Taking Active Self  losartan (COZAAR) 100 MG tablet [id number] Yes Take 1 tablet (100 mg total) by mouth daily. [name], Nino Glow, MD Taking Active   metFORMIN (GLUCOPHAGE) 500 MG tablet [id number] Yes Take 1 tablet (500 mg total) by mouth 2 (two) times daily with a meal. [name], Nino Glow, MD Taking Active   [facility] LANCETS 68E MISC [id number] Yes 1 Stick by Does not apply route 3 (three) times daily. [name], Nino Glow, MD Taking Active Self  Med List Note Leeanne Rio, [facility] 07/11/18 0831): [facility]           Assessment:  Goals Addressed            This Visit's Progress     Patient Stated   . PharmD "I can't afford this medication" (pt-stated)       CARE PLAN ENTRY (see longtitudinal plan of care for additional care plan information)  Current Barriers:  . Diabetes: uncontrolled, complicated by chronic medical conditions including obesity, HTN, most recent A1c 8.7% o Issues w/ affording brand name medications. Paid >$600 for 1 month of Trulicity.  . Most recent eGFR: >100 mL/min . Current antihyperglycemic regimen: metformin 789 mg BID, Trulicity 3.81 mg  weekly  . Cardiovascular risk reduction: o Current hypertensive regimen: HCTZ 12.5 mg daily, losartan 100 mg daily o Current hyperlipidemia regimen: atorvastatin 20 mg daily   Pharmacist Clinical Goal(s):  Marland Kitchen Over the next 90 days, patient will work with PharmD and primary care provider to address optimized medication management.  Interventions: . Comprehensive medication review performed, medication list updated in electronic medical record . Inter-disciplinary care team collaboration (see longitudinal plan of care) . Contacted [employer] prescription benefits. Patient has a large deductible; after paying for this last Trulicity fill, still has $1145 to pay through. Unfortunately, GLP1 coupon cards only take off a max benefit of $150/month, so cost is still quite expensive. However, Steglatro (ertugliflozin) copay for his plan is $307.75/30 day, and their coupon card pays a max benefit of $583 - so copay should be $0 per month, and will help pay through deductible. After deductible is paid, there will be more freedom to use GLP1 (such as Ozempic) w/ coupon card, to target weight loss.  . Reviewed the above with patient. He is amenable to whatever change is recommended by PCP.  Marland Kitchen Recommend starting Steglatro 5 mg daily, and encouraging patient to use coupon card to reduce copay. In the future, could increase metformin as needed to target A1c <7%  Patient Self Care Activities:  . Patient will check blood glucose BID, document, and provide at future appointments . Patient will take medications as prescribed . Patient will contact provider with any episodes of hypoglycemia . Patient will report any questions or concerns to provider   Initial goal documentation        Plan: - Will outreach as previously scheduled  Catie Darnelle Maffucci, PharmD, [place], [place] Pharmacist [place] [phone]

## 2019-07-10 NOTE — Patient Instructions (Signed)
Visit Information  Goals Addressed            This Visit's Progress     Patient Stated   . PharmD "I can't afford this medication" (pt-stated)       CARE PLAN ENTRY (see longtitudinal plan of care for additional care plan information)  Current Barriers:  . Diabetes: uncontrolled, complicated by chronic medical conditions including obesity, HTN, most recent A1c 8.7% o Issues w/ affording brand name medications. Paid >$600 for 1 month of Trulicity.  . Most recent eGFR: >100 mL/min . Current antihyperglycemic regimen: metformin 292 mg BID, Trulicity 4.46 mg weekly  . Cardiovascular risk reduction: o Current hypertensive regimen: HCTZ 12.5 mg daily, losartan 100 mg daily o Current hyperlipidemia regimen: atorvastatin 20 mg daily   Pharmacist Clinical Goal(s):  Marland Kitchen Over the next 90 days, patient will work with PharmD and primary care provider to address optimized medication management.  Interventions: . Comprehensive medication review performed, medication list updated in electronic medical record . Inter-disciplinary care team collaboration (see longitudinal plan of care) . Contacted [employer] prescription benefits. Patient has a large deductible; after paying for this last Trulicity fill, still has $1145 to pay through. Unfortunately, GLP1 coupon cards only take off a max benefit of $150/month, so cost is still quite expensive. However, Steglatro (ertugliflozin) copay for his plan is $307.75/30 day, and their coupon card pays a max benefit of $583 - so copay should be $0 per month, and will help pay through deductible. After deductible is paid, there will be more freedom to use GLP1 (such as Ozempic) w/ coupon card, to target weight loss.  . Reviewed the above with patient. He is amenable to whatever change is recommended by PCP.  Marland Kitchen Recommend starting Steglatro 5 mg daily, and encouraging patient to use coupon card to reduce copay. In the future, could increase metformin as needed to target A1c  <7%  Patient Self Care Activities:  . Patient will check blood glucose BID, document, and provide at future appointments . Patient will take medications as prescribed . Patient will contact provider with any episodes of hypoglycemia . Patient will report any questions or concerns to provider   Initial goal documentation        Patient verbalizes understanding of instructions provided today.    Plan: - Will outreach as previously scheduled  Catie Darnelle Maffucci, PharmD, [place], [place] Pharmacist [place] [phone]

## 2019-07-16 ENCOUNTER — Encounter: Payer: Self-pay | Admitting: Internal Medicine

## 2019-07-16 ENCOUNTER — Other Ambulatory Visit: Payer: Self-pay | Admitting: Internal Medicine

## 2019-07-16 DIAGNOSIS — E1159 Type 2 diabetes mellitus with other circulatory complications: Secondary | ICD-10-CM | POA: Insufficient documentation

## 2019-07-16 DIAGNOSIS — E1165 Type 2 diabetes mellitus with hyperglycemia: Secondary | ICD-10-CM

## 2019-07-16 MED ORDER — STEGLATRO 5 MG PO TABS: 5.0000 mg | ORAL_TABLET | Freq: Every day | ORAL | 3 refills | Status: DC

## 2019-07-20 IMAGING — DX DG ANKLE COMPLETE 3+V*R*
3 series · 3 of 3 positions shown · non-contrast
Comparison: None.

CLINICAL DATA: Acute right ankle pain after football injury
yesterday.

EXAM:
RIGHT ANKLE - COMPLETE 3+ VIEW

[ankle ap]
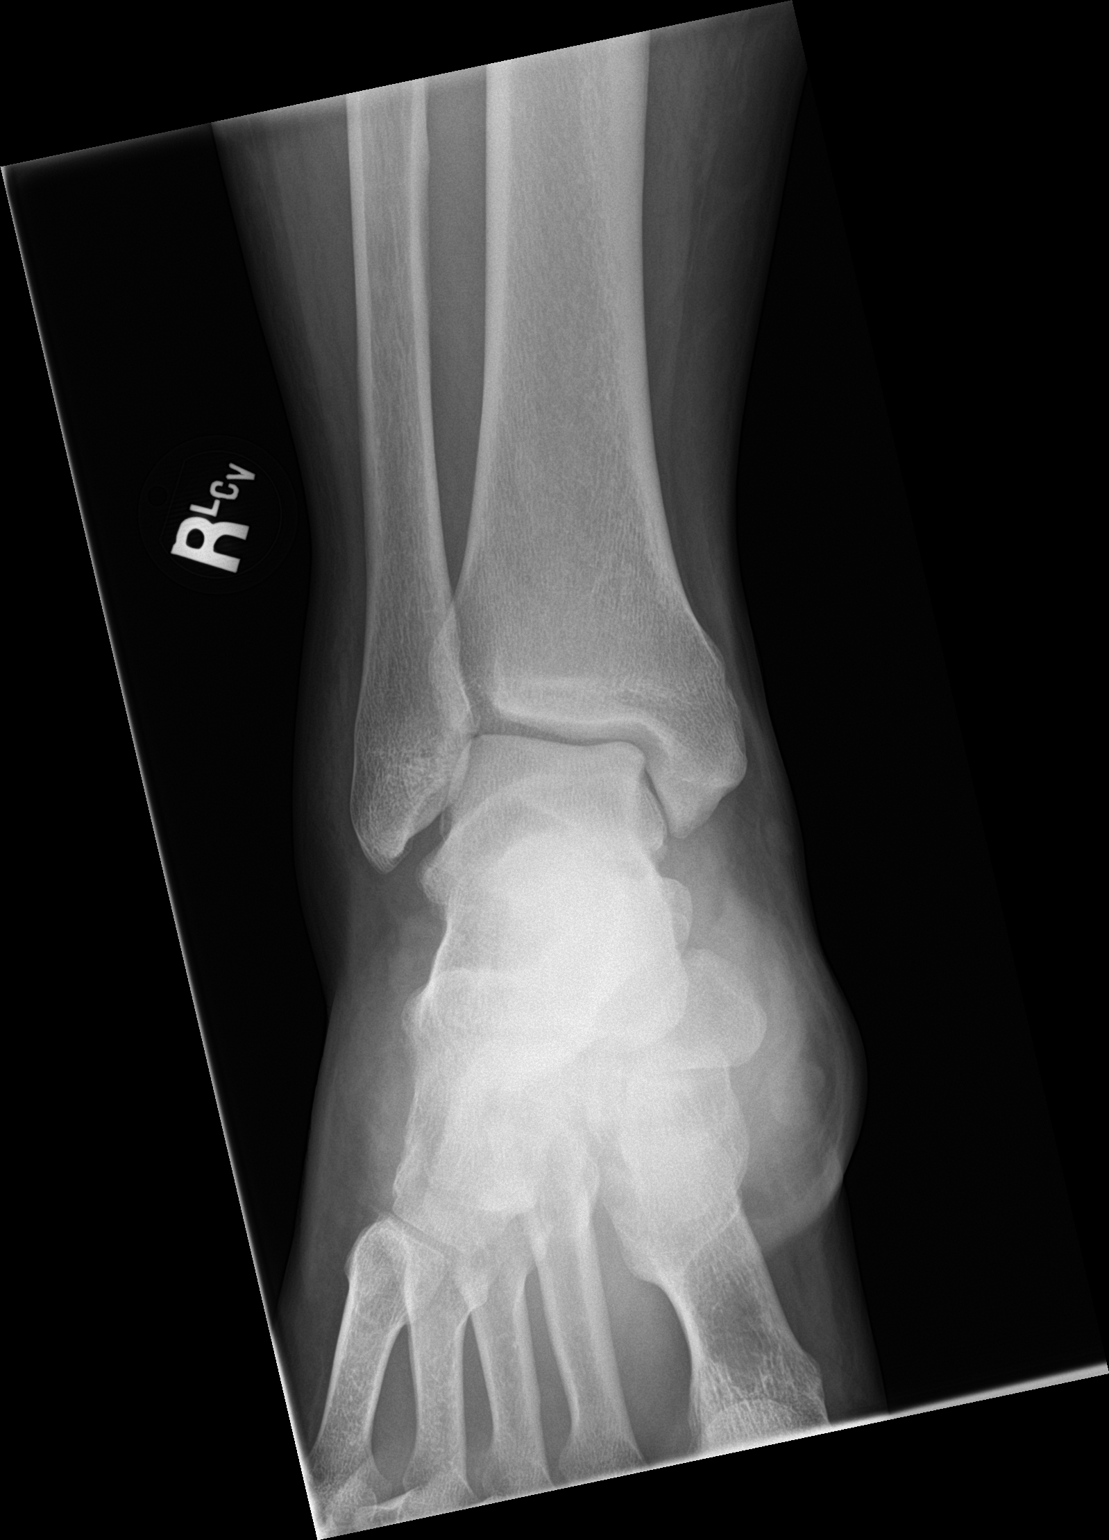

[ankle obl]
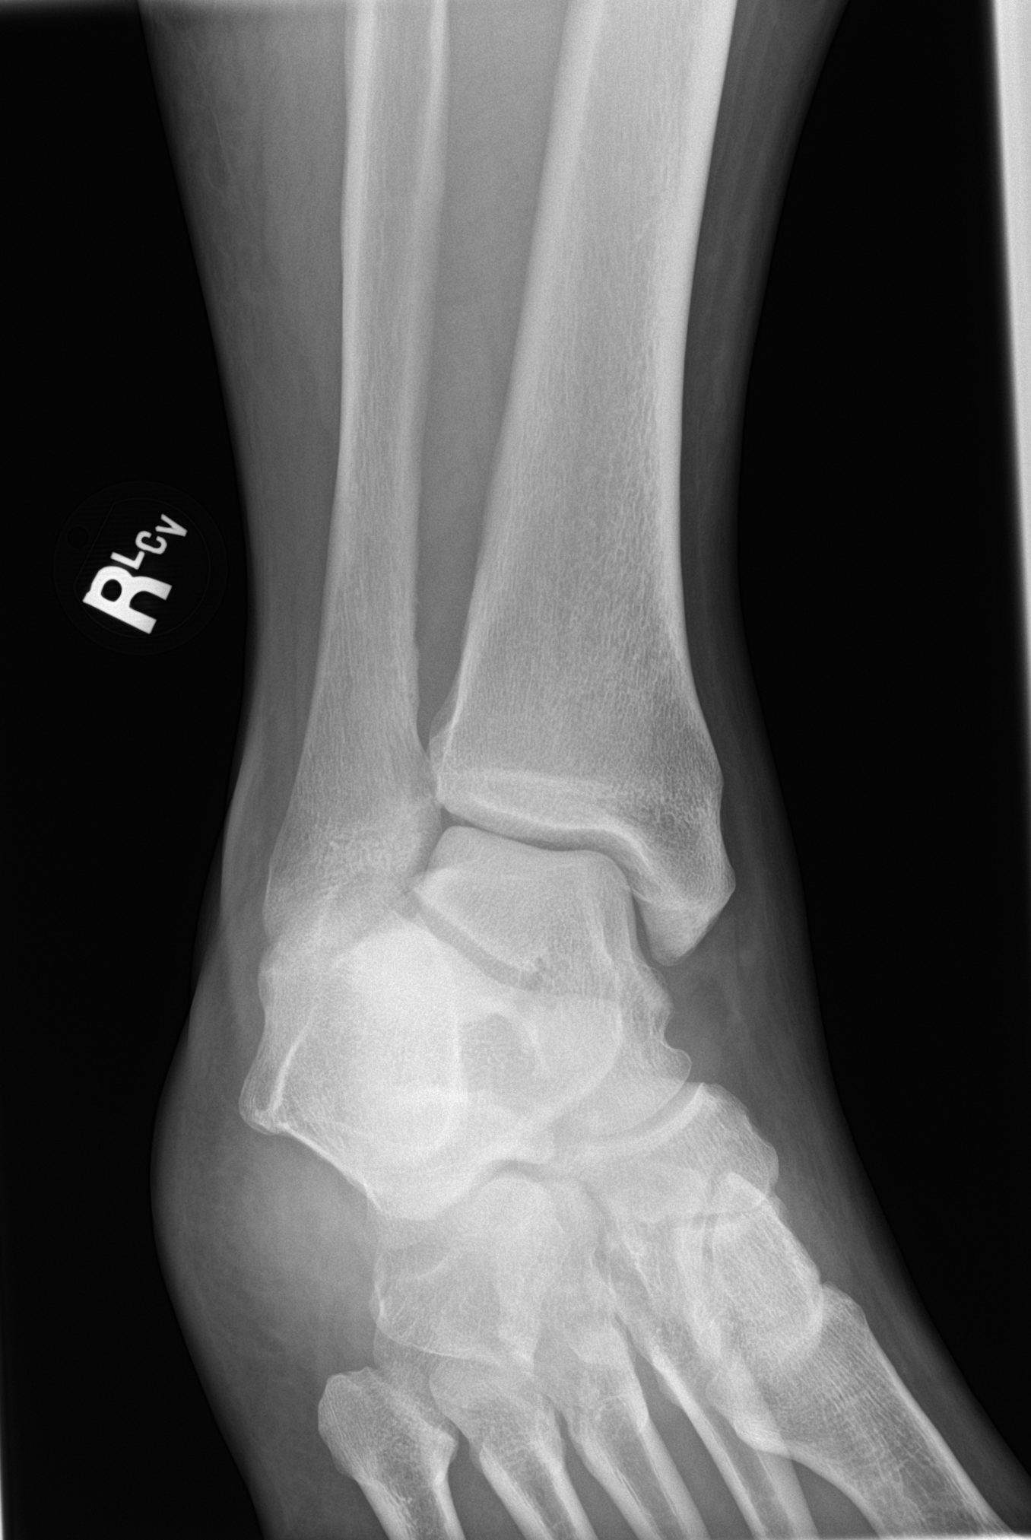

[ankle lat]
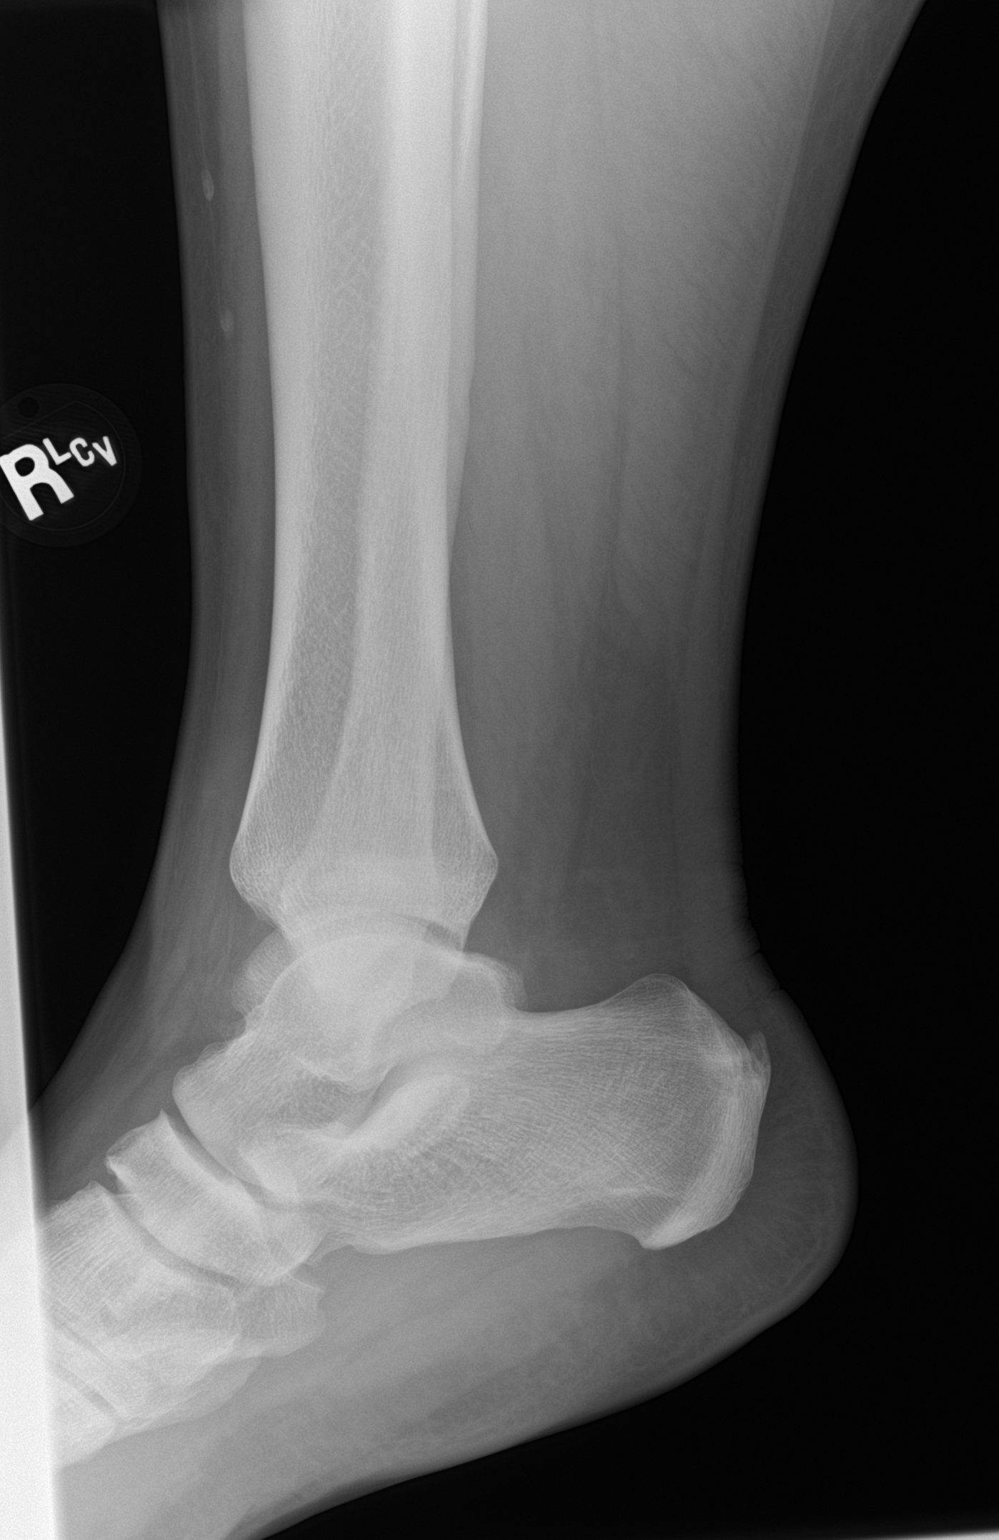

[3 of 3 positions shown; findings below may reference images not displayed]

FINDINGS: There is no evidence of fracture, dislocation, or joint effusion.
There is no evidence of arthropathy or other focal bone abnormality.
Soft tissue swelling is seen over the lateral malleolus.
IMPRESSION: No fracture or dislocation is noted. Soft tissue swelling is seen
over lateral malleolus suggesting ligamentous injury.

## 2019-07-25 ENCOUNTER — Telehealth: Payer: Self-pay | Admitting: Internal Medicine

## 2019-07-25 NOTE — Telephone Encounter (Signed)
I spoke with Bariatrics and pt has to take a seminar first I left pt a vm to call me at ofc. 

## 2019-07-25 NOTE — Telephone Encounter (Signed)
I spoke with Bariatrics and pt has to take a seminar first I left pt a vm to call me at ofc.

## 2019-07-25 NOTE — Telephone Encounter (Signed)
Noted  

## 2019-07-27 ENCOUNTER — Ambulatory Visit: Payer: Managed Care, Other (non HMO) | Admitting: Pharmacist

## 2019-07-27 DIAGNOSIS — E119 Type 2 diabetes mellitus without complications: Secondary | ICD-10-CM

## 2019-07-27 NOTE — Chronic Care Management (AMB) (Signed)
Chronic Care Management   Follow Up Note   07/27/2019 Name: Rickey Payne MRN: [id number] DOB: [birth year]  Referred by: [name], Nino Glow, MD Reason for referral : Chronic Care Management (Medication Management)   [name] is a 29 y.o. year old male who is a primary care patient of [name], Nino Glow, MD. The CCM team was consulted for assistance with chronic disease management and care coordination needs.    Contacted patient for medication management review.   Review of patient status, including review of consultants reports, relevant laboratory and other test results, and collaboration with appropriate care team members and the patient's provider was performed as part of comprehensive patient evaluation and provision of chronic care management services.    SDOH (Social Determinants of Health) assessments performed: Yes See Care Plan activities for detailed interventions related to SDOH)  SDOH Interventions     Most Recent Value  SDOH Interventions  Financial Strain Interventions Other (Comment)  [high deductible plan]       Outpatient Encounter Medications as of 07/27/2019  Medication Sig  . ertugliflozin L-PyroglutamicAc (STEGLATRO) 5 MG TABS tablet Take 1 tablet (5 mg total) by mouth daily before breakfast.  . metFORMIN (GLUCOPHAGE) 500 MG tablet Take 1 tablet (500 mg total) by mouth 2 (two) times daily with a meal.  . atorvastatin (LIPITOR) 20 MG tablet Take 1 tablet (20 mg total) by mouth daily at 6 PM.  . fluticasone (FLONASE) 50 MCG/ACT nasal spray Place 1 spray into both nostrils daily as needed for allergies or rhinitis.  Marland Kitchen glucose blood test strip Use as instructed  . hydrochlorothiazide (HYDRODIURIL) 12.5 MG tablet Take 1 tablet (12.5 mg total) by mouth daily. In the am  . Insulin Pen Needle (PEN NEEDLES) 30G X 8 MM MISC 1 Device by Does not apply route once a week.  . loratadine (CLARITIN) 10 MG tablet Take 10 mg by mouth daily.  Marland Kitchen losartan  (COZAAR) 100 MG tablet Take 1 tablet (100 mg total) by mouth daily.  Glory Rosebush DELICA LANCETS 64Q MISC 1 Stick by Does not apply route 3 (three) times daily.  . [DISCONTINUED] Dulaglutide (TRULICITY) 0.34 VQ/2.5ZD SOPN Inject 0.75 mg into the skin once a week. Increase to 1.5 mg after 1 month and call me and let me know If blood sugars am are >130 and >180 after meals   No facility-administered encounter medications on file as of 07/27/2019.     Objective:   Goals Addressed              This Visit's Progress     Patient Stated   .  PharmD "I can't afford this medication" (pt-stated)        CARE PLAN ENTRY (see longtitudinal plan of care for additional care plan information)  Current Barriers:  . Diabetes: uncontrolled, complicated by chronic medical conditions including obesity, HTN, most recent A1c 8.7% o Issues w/ affording brand name medications. Paid >$600 for 1 month of Trulicity. Notes he is finishing that supply now, so starting [facility] supply tomorrow. Started Steglatro because savings card has the highest maximum savings per month, and may help pay through high deductible  . Most recent eGFR: >100 mL/min . Current antihyperglycemic regimen: metformin 638 mg BID, Trulicity 7.56 mg weekly (finishing), Steglatro 5 mg daily (about to start) . Cardiovascular risk reduction: o Current hypertensive regimen: HCTZ 12.5 mg daily, losartan 100 mg daily o Current hyperlipidemia regimen: atorvastatin 20 mg daily   Pharmacist Clinical Goal(s):  .  Over the next 90 days, patient will work with PharmD and primary care provider to address optimized medication management.  Interventions: . Comprehensive medication review performed, medication list updated in electronic medical record . Inter-disciplinary care team collaboration (see longitudinal plan of care) . Reviewed counseling for SGLT2. Patient will start [facility] tomorrow.  . Reviewed that pending blood sugars on metformin 500 mg  BID + Steglatro 5 mg daily, we can increase metformin or Steglatro moving forward. He verbalized understanding . Reviewed goal A1c, goal fasting, and goal 2 hour post prandial glucoses. Will type up education for patient.   Patient Self Care Activities:  . Patient will check blood glucose BID, document, and provide at future appointments . Patient will take medications as prescribed . Patient will contact provider with any episodes of hypoglycemia . Patient will report any questions or concerns to provider   Please see past updates related to this goal by clicking on the "Past Updates" button in the selected goal          Plan:  - Scheduled f/u call in ~ 6 weeks  Catie Darnelle Maffucci, PharmD, [place], [place] Pharmacist [place] [phone]

## 2019-07-27 NOTE — Patient Instructions (Addendum)
Rickey Payne,   It was great talking with you today!  Our goal A1c (3 month average of how well controlled your blood sugars are) is <7%. This is the best way to reduce the risk of the long term complications of diabetes - heart disease, kidney disease, nerve damage, etc. An A1c of <7% generally corresponds with fasting sugar readings <130 and 2 hour after meal readings <180. Between now and our next call, please check some fastings readings and some after meal readings. We can talk about these results on our next call, and this will help determine next steps with your medications.   Moving forward, we can discuss increasing metformin (to the maximum dose of 1000 mg twice daily) or increasing Steglatro (to the maximum dose of 15 mg daily).   Feel free to call me with any questions or concerns before our next call on July 30th!  Thanks!  Catie Darnelle Maffucci, PharmD, [place]  Visit Information  Goals Addressed              This Visit's Progress     Patient Stated   .  PharmD "I can't afford this medication" (pt-stated)        CARE PLAN ENTRY (see longtitudinal plan of care for additional care plan information)  Current Barriers:  . Diabetes: uncontrolled, complicated by chronic medical conditions including obesity, HTN, most recent A1c 8.7% o Issues w/ affording brand name medications. Paid >$600 for 1 month of Trulicity. Notes he is finishing that supply now, so starting [facility] supply tomorrow. Started Steglatro because savings card has the highest maximum savings per month, and may help pay through high deductible  . Most recent eGFR: >100 mL/min . Current antihyperglycemic regimen: metformin 073 mg BID, Trulicity 7.10 mg weekly (finishing), Steglatro 5 mg daily (about to start) . Cardiovascular risk reduction: o Current hypertensive regimen: HCTZ 12.5 mg daily, losartan 100 mg daily o Current hyperlipidemia regimen: atorvastatin 20 mg daily   Pharmacist Clinical Goal(s):   Marland Kitchen Over the next 90 days, patient will work with PharmD and primary care provider to address optimized medication management.  Interventions: . Comprehensive medication review performed, medication list updated in electronic medical record . Inter-disciplinary care team collaboration (see longitudinal plan of care) . Reviewed counseling for SGLT2. Patient will start [facility] tomorrow.  . Reviewed that pending blood sugars on metformin 500 mg BID + Steglatro 5 mg daily, we can increase metformin or Steglatro moving forward. He verbalized understanding . Reviewed goal A1c, goal fasting, and goal 2 hour post prandial glucoses. Will type up education for patient.   Patient Self Care Activities:  . Patient will check blood glucose BID, document, and provide at future appointments . Patient will take medications as prescribed . Patient will contact provider with any episodes of hypoglycemia . Patient will report any questions or concerns to provider   Please see past updates related to this goal by clicking on the "Past Updates" button in the selected goal         The patient verbalized understanding of instructions provided today and agreed to receive a mailed copy of patient instruction and/or educational materials.  Plan:  - Scheduled f/u call in ~ 6 weeks  Catie Darnelle Maffucci, PharmD, [place], [place] Pharmacist [place] [phone]

## 2019-07-27 NOTE — Telephone Encounter (Signed)
I spoke with pt he is scheduled for the seminar on 08/22/2019.

## 2019-08-28 ENCOUNTER — Other Ambulatory Visit: Payer: Self-pay | Admitting: Internal Medicine

## 2019-08-28 DIAGNOSIS — E1165 Type 2 diabetes mellitus with hyperglycemia: Secondary | ICD-10-CM

## 2019-08-28 DIAGNOSIS — E119 Type 2 diabetes mellitus without complications: Secondary | ICD-10-CM

## 2019-08-28 MED ORDER — METFORMIN HCL 500 MG PO TABS: 500.0000 mg | ORAL_TABLET | Freq: Two times a day (BID) | ORAL | 3 refills | Status: DC

## 2019-09-07 ENCOUNTER — Other Ambulatory Visit: Payer: Self-pay | Admitting: Internal Medicine

## 2019-09-07 ENCOUNTER — Ambulatory Visit: Payer: Managed Care, Other (non HMO) | Admitting: Pharmacist

## 2019-09-07 DIAGNOSIS — E1159 Type 2 diabetes mellitus with other circulatory complications: Secondary | ICD-10-CM

## 2019-09-07 DIAGNOSIS — E119 Type 2 diabetes mellitus without complications: Secondary | ICD-10-CM

## 2019-09-07 NOTE — Chronic Care Management (AMB) (Signed)
Chronic Care Management   Follow Up Note   09/07/2019 Name: Rickey Payne MRN: [id number] DOB: 15-Apr-1990  Referred by: [name], Nino Glow, MD Reason for referral : Chronic Care Management (Medication Management)   Rickey Payne is a 29 y.o. year old male who is a primary care patient of [name], Nino Glow, MD. The CCM team was consulted for assistance with chronic disease management and care coordination needs.    Contacted patient for medication management/access f/u  Review of patient status, including review of consultants reports, relevant laboratory and other test results, and collaboration with appropriate care team members and the patient's provider was performed as part of comprehensive patient evaluation and provision of chronic care management services.    SDOH (Social Determinants of Health) assessments performed: Yes See Care Plan activities for detailed interventions related to SDOH)  SDOH Interventions     Most Recent Value  SDOH Interventions  Financial Strain Interventions Other (Comment)  [medication access review]       Outpatient Encounter Medications as of 09/07/2019  Medication Sig   atorvastatin (LIPITOR) 20 MG tablet Take 1 tablet (20 mg total) by mouth daily at 6 PM.   ertugliflozin L-PyroglutamicAc (STEGLATRO) 5 MG TABS tablet Take 1 tablet (5 mg total) by mouth daily before breakfast.   fluticasone (FLONASE) 50 MCG/ACT nasal spray Place 1 spray into both nostrils daily as needed for allergies or rhinitis.   glucose blood test strip Use as instructed   hydrochlorothiazide (HYDRODIURIL) 12.5 MG tablet Take 1 tablet (12.5 mg total) by mouth daily. In the am   loratadine (CLARITIN) 10 MG tablet Take 10 mg by mouth daily.   losartan (COZAAR) 100 MG tablet Take 1 tablet (100 mg total) by mouth daily.   metFORMIN (GLUCOPHAGE) 500 MG tablet Take 1 tablet (500 mg total) by mouth 2 (two) times daily with a meal.   Omega-3 Fatty Acids (FISH  OIL) 1200 MG CPDR Take 1 capsule by mouth.   ONETOUCH DELICA LANCETS 50P MISC 1 Stick by Does not apply route 3 (three) times daily.   [DISCONTINUED] Insulin Pen Needle (PEN NEEDLES) 30G X 8 MM MISC 1 Device by Does not apply route once a week.   No facility-administered encounter medications on file as of 09/07/2019.     Objective:   Goals Addressed              This Visit's Progress     Patient Stated     PharmD "I can't afford this medication" (pt-stated)        CARE PLAN ENTRY (see longtitudinal plan of care for additional care plan information)  Current Barriers:   Social, financial, and community barriers:  o [employer] plan, so difficult to afford brand name medications. Steglatro affordable d/t high maximum benefit  o Notes that he was referred to a weight management surgical clinic, has paperwork that needs to be completed by PCP   Diabetes: uncontrolled, complicated by chronic medical conditions including obesity, HTN, most recent A1c 8.7%  Most recent eGFR: >100 mL/min  Current antihyperglycemic regimen: metformin 500 mg BID, Steglato 5 mg daily - denies any s/sx genitourinary infections since starting Steglatro  Current glucose readings: o Fastings: not checking recently o Post prandial: 130-140s  Cardiovascular risk reduction: o Current hypertensive regimen: HCTZ 12.5 mg daily, losartan 100 mg daily o Current hyperlipidemia regimen: atorvastatin 20 mg daily   Pharmacist Clinical Goal(s):   Over the next 90 days, patient will work with PharmD  and primary care provider to address optimized medication management.  Interventions:  Comprehensive medication review performed, medication list updated in electronic medical record  Inter-disciplinary care team collaboration (see longitudinal plan of care)  Encouraged continued adherence to metformin 500 mg BID and Steglatro 5 mg daily. Reviewed that Steglatro dose could be increased moving  forward to 15 mg daily. F/u scheduled w/ PCP in November, but will collaborate w/ PCP to see if she would like A1c scheduled sooner. Last A1c 06/20/19  Encouraged patient to bring weight management paperwork to clinic for PCP to complete  Patient Self Care Activities:   Patient will check blood glucose BID, document, and provide at future appointments  Patient will take medications as prescribed  Patient will contact provider with any episodes of hypoglycemia  Patient will report any questions or concerns to provider   Please see past updates related to this goal by clicking on the "Past Updates" button in the selected goal          Plan:  - Will collaborate w/ PCP as above - Scheduled f/u call in ~ 8 weeks  Catie Darnelle Maffucci, PharmD, [place], [place] Pharmacist [place] [phone]

## 2019-09-07 NOTE — Progress Notes (Signed)
We can do a poc A1C after 09/20/19  Schedule please nurse visit

## 2019-09-07 NOTE — Patient Instructions (Addendum)
Visit Information  Goals Addressed              This Visit's Progress     Patient Stated   .  PharmD "I can't afford this medication" (pt-stated)        CARE PLAN ENTRY (see longtitudinal plan of care for additional care plan information)  Current Barriers:  . Social, financial, and community barriers:  o [employer] plan, so difficult to afford brand name medications. Steglatro affordable d/t high maximum benefit  o Notes that he was referred to a weight management surgical clinic, has paperwork that needs to be completed by PCP  . Diabetes: uncontrolled, complicated by chronic medical conditions including obesity, HTN, most recent A1c 8.7% . Most recent eGFR: >100 mL/min . Current antihyperglycemic regimen: metformin 500 mg BID, Steglato 5 mg daily - denies any s/sx genitourinary infections since starting Steglatro . Current glucose readings: o Fastings: not checking recently o Post prandial: 130-140s . Cardiovascular risk reduction: o Current hypertensive regimen: HCTZ 12.5 mg daily, losartan 100 mg daily o Current hyperlipidemia regimen: atorvastatin 20 mg daily   Pharmacist Clinical Goal(s):  Marland Kitchen Over the next 90 days, patient will work with PharmD and primary care provider to address optimized medication management.  Interventions: . Comprehensive medication review performed, medication list updated in electronic medical record . Inter-disciplinary care team collaboration (see longitudinal plan of care) . Encouraged continued adherence to metformin 500 mg BID and Steglatro 5 mg daily. Reviewed that Steglatro dose could be increased moving forward to 15 mg daily. F/u scheduled w/ PCP in November, but will collaborate w/ PCP to see if she would like A1c scheduled sooner. Last A1c 06/20/19 . Encouraged patient to bring weight management paperwork to clinic for PCP to complete  Patient Self Care Activities:  . Patient will check blood glucose BID, document, and  provide at future appointments . Patient will take medications as prescribed . Patient will contact provider with any episodes of hypoglycemia . Patient will report any questions or concerns to provider   Please see past updates related to this goal by clicking on the "Past Updates" button in the selected goal         The patient verbalized understanding of instructions provided today and declined a print copy of patient instruction materials.   Plan:  - Will collaborate w/ PCP as above - Scheduled f/u call in ~ 8 weeks  Catie Darnelle Maffucci, PharmD, [place], [place] Pharmacist [place] [phone]

## 2019-09-11 ENCOUNTER — Telehealth: Payer: Self-pay | Admitting: *Deleted

## 2019-09-11 NOTE — Chronic Care Management (AMB) (Signed)
  Care Management   Note  09/11/2019 Name: JERRAN TAPPAN MRN: [id number] DOB: 10/01/1990  [name] is a 29 y.o. year old male who is a primary care patient of [name], [name], MD and is actively engaged with the care management team. I reached out to [name] by phone today to assist with scheduling a follow up visit with the Pharmacist.  Follow up plan: Unsuccessful telephone outreach attempt made. A HIPPA compliant phone message was left for the patient providing contact information and requesting a return call. The care management team will reach out to the patient again over the next 7 days. If patient returns call to provider office, please advise to call Embedded Care Management Care Guide Gwenevere Ghazi at [phone].  Gwenevere Ghazi  Care Guide, Embedded Care Coordination [facility]  [place], [place] [address] Direct Dial: [phone] [email or web address] Website: [email or web address]

## 2019-09-21 ENCOUNTER — Other Ambulatory Visit: Payer: Self-pay

## 2019-09-21 ENCOUNTER — Telehealth: Payer: Self-pay | Admitting: Internal Medicine

## 2019-09-21 DIAGNOSIS — I10 Essential (primary) hypertension: Secondary | ICD-10-CM

## 2019-09-21 MED ORDER — HYDROCHLOROTHIAZIDE 12.5 MG PO TABS: 12.5000 mg | ORAL_TABLET | Freq: Every day | ORAL | 0 refills | Status: DC

## 2019-09-21 NOTE — Telephone Encounter (Signed)
Pt called in need refill on hydrochlorothiazide (HYDRODIURIL) 12.5 MG tablet

## 2019-10-26 ENCOUNTER — Encounter: Payer: Self-pay | Admitting: Internal Medicine

## 2019-10-29 ENCOUNTER — Other Ambulatory Visit: Payer: Self-pay | Admitting: Internal Medicine

## 2019-10-29 DIAGNOSIS — I10 Essential (primary) hypertension: Secondary | ICD-10-CM

## 2019-10-29 DIAGNOSIS — E785 Hyperlipidemia, unspecified: Secondary | ICD-10-CM

## 2019-10-29 MED ORDER — LOSARTAN POTASSIUM 100 MG PO TABS: 100.0000 mg | ORAL_TABLET | Freq: Every day | ORAL | 3 refills | Status: DC

## 2019-10-29 MED ORDER — GLUCOSE BLOOD VI STRP: ORAL_STRIP | 12 refills | Status: DC

## 2019-10-29 MED ORDER — ATORVASTATIN CALCIUM 20 MG PO TABS: 20.0000 mg | ORAL_TABLET | Freq: Every day | ORAL | 3 refills | Status: DC

## 2019-10-29 MED ORDER — ONETOUCH DELICA LANCETS 33G MISC: 1.0000 | Freq: Every day | 12 refills | Status: DC

## 2019-11-02 ENCOUNTER — Ambulatory Visit: Payer: Managed Care, Other (non HMO) | Admitting: Pharmacist

## 2019-11-02 DIAGNOSIS — E119 Type 2 diabetes mellitus without complications: Secondary | ICD-10-CM

## 2019-11-02 DIAGNOSIS — E785 Hyperlipidemia, unspecified: Secondary | ICD-10-CM

## 2019-11-02 DIAGNOSIS — I10 Essential (primary) hypertension: Secondary | ICD-10-CM

## 2019-11-02 MED ORDER — HYDROCHLOROTHIAZIDE 12.5 MG PO TABS: 12.5000 mg | ORAL_TABLET | Freq: Every day | ORAL | 3 refills | Status: DC

## 2019-11-02 NOTE — Chronic Care Management (AMB) (Signed)
Chronic Care Management   Follow Up Note   11/02/2019 Name: Rickey Payne MRN: [id number] DOB: 01-11-1991  Referred by: [name], Nino Glow, MD Reason for referral : Chronic Care Management (Medication Management)   Rickey Payne is a 29 y.o. year old male who is a primary care patient of [name], Nino Glow, MD. The CCM team was consulted for assistance with chronic disease management and care coordination needs.    Contacted patient for medication access/management follow up.   Review of patient status, including review of consultants reports, relevant laboratory and other test results, and collaboration with appropriate care team members and the patient's provider was performed as part of comprehensive patient evaluation and provision of chronic care management services.    SDOH (Social Determinants of Health) assessments performed: Yes See Care Plan activities for detailed interventions related to SDOH)  SDOH Interventions     Most Recent Value  SDOH Interventions  Financial Strain Interventions Intervention Not Indicated       Outpatient Encounter Medications as of 11/02/2019  Medication Sig  . ertugliflozin L-PyroglutamicAc (STEGLATRO) 5 MG TABS tablet Take 1 tablet (5 mg total) by mouth daily before breakfast.  . hydrochlorothiazide (HYDRODIURIL) 12.5 MG tablet Take 1 tablet (12.5 mg total) by mouth daily. In the am  . loratadine (CLARITIN) 10 MG tablet Take 10 mg by mouth daily.  Marland Kitchen losartan (COZAAR) 100 MG tablet Take 1 tablet (100 mg total) by mouth daily.  . metFORMIN (GLUCOPHAGE) 500 MG tablet Take 1 tablet (500 mg total) by mouth 2 (two) times daily with a meal.  . Omega-3 Fatty Acids (FISH OIL) 1200 MG CPDR Take 1 capsule by mouth.  . [DISCONTINUED] hydrochlorothiazide (HYDRODIURIL) 12.5 MG tablet Take 1 tablet (12.5 mg total) by mouth daily. In the am  . atorvastatin (LIPITOR) 20 MG tablet Take 1 tablet (20 mg total) by mouth daily at 6 PM.  . fluticasone  (FLONASE) 50 MCG/ACT nasal spray Place 1 spray into both nostrils daily as needed for allergies or rhinitis.  Marland Kitchen glucose blood test strip Use as instructed  . OneTouch Delica Lancets 34J MISC 1 Stick by Does not apply route daily.   No facility-administered encounter medications on file as of 11/02/2019.     Objective:   Goals Addressed              This Visit's Progress     Patient Stated   .  COMPLETED: PharmD "I can't afford this medication" (pt-stated)        CARE PLAN ENTRY (see longtitudinal plan of care for additional care plan information)  Current Barriers:  . Social, financial, and community barriers:  o [employer] plan, so difficult to afford brand name medications. Steglatro affordable d/t high maximum benefit, patient paying $0 . Diabetes: uncontrolled, complicated by chronic medical conditions including obesity, HTN, most recent A1c 8.7% . Most recent eGFR: >100 mL/min . Current antihyperglycemic regimen: metformin 500 mg BID, Steglato 5 mg daily - denies any s/sx genitourinary infections since starting Steglatro o Unfortunately, cannot access GLP1 d/t high insurance deductible and savings coupons do not have as high of a max benefit . Current glucose readings: o Fastings: infrequently checking, but 80-100s o Post prandial: 130-150s . Cardiovascular risk reduction: o Current hypertensive regimen: HCTZ 12.5 mg daily, losartan 100 mg daily; last clinic BP well controlled. Notes he has not checked recently at home d/t BP being well controlled. Did note some concerns getting refills in time to not run  out of medication. o Current hyperlipidemia regimen: atorvastatin 20 mg daily; LDL not at goal <100  Pharmacist Clinical Goal(s):  Marland Kitchen Over the next 90 days, patient will work with PharmD and primary care provider to address optimized medication management.  Interventions: . Comprehensive medication review performed, medication list updated in electronic  medical record . Inter-disciplinary care team collaboration (see longitudinal plan of care) . Reviewed goal A1c, goal fasting, and goal 2 hour post prandial glucose. Praised for maintenance of glucose control.  . Had previously discussed A1c check, but now we are within 3 months of PCP visit, so agreed to wait until PCP f/u in November for full lab work. Patient verbalizes understanding.  . Reviewed importance of adherence to medications to maintain BP and cholesterol control. Discussed contacting the pharmacy for refill requests at least a week before running out of medications. Will send refill on HCTZ today to ensure supply until his next PCP appt. Patient verbalized understanding  Patient Self Care Activities:  . Patient will check blood glucose periodically, document, and provide at future appointments . Patient will take medications as prescribed . Patient will report any questions or concerns to provider   Please see past updates related to this goal by clicking on the "Past Updates" button in the selected goal          Plan:  - Patient denies any other questions or concerns at this time. He has my contact information for any future needs. Closing CCM case at this time.   Catie Darnelle Maffucci, PharmD, [place], CPP Clinical Pharmacist [place] [phone]

## 2019-11-02 NOTE — Patient Instructions (Signed)
Visit Information  Goals Addressed              This Visit's Progress     Patient Stated   .  COMPLETED: PharmD "I can't afford this medication" (pt-stated)        CARE PLAN ENTRY (see longtitudinal plan of care for additional care plan information)  Current Barriers:  . Social, financial, and community barriers:  o [employer] plan, so difficult to afford brand name medications. Steglatro affordable d/t high maximum benefit, patient paying $0 . Diabetes: uncontrolled, complicated by chronic medical conditions including obesity, HTN, most recent A1c 8.7% . Most recent eGFR: >100 mL/min . Current antihyperglycemic regimen: metformin 500 mg BID, Steglato 5 mg daily - denies any s/sx genitourinary infections since starting Steglatro o Unfortunately, cannot access GLP1 d/t high insurance deductible and savings coupons do not have as high of a max benefit . Current glucose readings: o Fastings: infrequently checking, but 80-100s o Post prandial: 130-150s . Cardiovascular risk reduction: o Current hypertensive regimen: HCTZ 12.5 mg daily, losartan 100 mg daily; last clinic BP well controlled. Notes he has not checked recently at home d/t BP being well controlled. Did note some concerns getting refills in time to not run out of medication. o Current hyperlipidemia regimen: atorvastatin 20 mg daily; LDL not at goal <100  Pharmacist Clinical Goal(s):  Marland Kitchen Over the next 90 days, patient will work with PharmD and primary care provider to address optimized medication management.  Interventions: . Comprehensive medication review performed, medication list updated in electronic medical record . Inter-disciplinary care team collaboration (see longitudinal plan of care) . Reviewed goal A1c, goal fasting, and goal 2 hour post prandial glucose. Praised for maintenance of glucose control.  . Had previously discussed A1c check, but now we are within 3 months of PCP visit, so agreed to  wait until PCP f/u in November for full lab work. Patient verbalizes understanding.  . Reviewed importance of adherence to medications to maintain BP and cholesterol control. Discussed contacting the pharmacy for refill requests at least a week before running out of medications. Will send refill on HCTZ today to ensure supply until his next PCP appt. Patient verbalized understanding  Patient Self Care Activities:  . Patient will check blood glucose periodically, document, and provide at future appointments . Patient will take medications as prescribed . Patient will report any questions or concerns to provider   Please see past updates related to this goal by clicking on the "Past Updates" button in the selected goal         The patient verbalized understanding of instructions provided today and declined a print copy of patient instruction materials.   Plan:  - Patient denies any other questions or concerns at this time. He has my contact information for any future needs. Closing CCM case at this time.   Catie Darnelle Maffucci, PharmD, [place], CPP Clinical Pharmacist [place] [phone]

## 2019-11-30 ENCOUNTER — Ambulatory Visit: Payer: Self-pay

## 2019-12-26 ENCOUNTER — Ambulatory Visit: Payer: Managed Care, Other (non HMO) | Admitting: Internal Medicine

## 2020-01-23 ENCOUNTER — Other Ambulatory Visit: Payer: Self-pay | Admitting: Internal Medicine

## 2020-01-23 ENCOUNTER — Encounter: Payer: Self-pay | Admitting: Internal Medicine

## 2020-01-23 ENCOUNTER — Other Ambulatory Visit: Payer: Self-pay

## 2020-01-23 ENCOUNTER — Ambulatory Visit (INDEPENDENT_AMBULATORY_CARE_PROVIDER_SITE_OTHER): Payer: Managed Care, Other (non HMO) | Admitting: Internal Medicine

## 2020-01-23 VITALS — BP 126/86 | HR 104 | Temp 97.8°F | Ht 73.0 in | Wt >= 6400 oz

## 2020-01-23 DIAGNOSIS — I152 Hypertension secondary to endocrine disorders: Secondary | ICD-10-CM | POA: Diagnosis not present

## 2020-01-23 DIAGNOSIS — E1159 Type 2 diabetes mellitus with other circulatory complications: Secondary | ICD-10-CM

## 2020-01-23 DIAGNOSIS — Z23 Encounter for immunization: Secondary | ICD-10-CM

## 2020-01-23 DIAGNOSIS — R748 Abnormal levels of other serum enzymes: Secondary | ICD-10-CM

## 2020-01-23 DIAGNOSIS — Z1329 Encounter for screening for other suspected endocrine disorder: Secondary | ICD-10-CM | POA: Diagnosis not present

## 2020-01-23 DIAGNOSIS — E559 Vitamin D deficiency, unspecified: Secondary | ICD-10-CM | POA: Diagnosis not present

## 2020-01-23 DIAGNOSIS — Z13818 Encounter for screening for other digestive system disorders: Secondary | ICD-10-CM

## 2020-01-23 DIAGNOSIS — Z6841 Body Mass Index (BMI) 40.0 and over, adult: Secondary | ICD-10-CM

## 2020-01-23 DIAGNOSIS — Z0184 Encounter for antibody response examination: Secondary | ICD-10-CM

## 2020-01-23 DIAGNOSIS — R809 Proteinuria, unspecified: Secondary | ICD-10-CM

## 2020-01-23 LAB — COMPREHENSIVE METABOLIC PANEL
ALT: 41 U/L (ref 0–53)
AST: 25 U/L (ref 0–37)
Albumin: 4.7 g/dL (ref 3.5–5.2)
Alkaline Phosphatase: 58 U/L (ref 39–117)
BUN: 13 mg/dL (ref 6–23)
CO2: 30 mEq/L (ref 19–32)
Calcium: 9.9 mg/dL (ref 8.4–10.5)
Chloride: 93 mEq/L — ABNORMAL LOW (ref 96–112)
Creatinine, Ser: 0.74 mg/dL (ref 0.40–1.50)
GFR: 122.11 mL/min (ref >60.00)
Glucose, Bld: 132 mg/dL — ABNORMAL HIGH (ref 70–99)
Potassium: 4.5 mEq/L (ref 3.5–5.1)
Sodium: 136 mEq/L (ref 135–145)
Total Bilirubin: 0.6 mg/dL (ref 0.2–1.2)
Total Protein: 7.7 g/dL (ref 6.0–8.3)

## 2020-01-23 LAB — LIPID PANEL
Cholesterol: 196 mg/dL (ref 0–200)
HDL: 43.9 mg/dL (ref >39.00)
NonHDL: 151.69
Total CHOL/HDL Ratio: 4
Triglycerides: 389 mg/dL — ABNORMAL HIGH (ref 0.0–149.0)
VLDL: 77.8 mg/dL — ABNORMAL HIGH (ref 0.0–40.0)

## 2020-01-23 LAB — CBC WITH DIFFERENTIAL/PLATELET
Basophils Absolute: 0 10*3/uL (ref 0.0–0.1)
Basophils Relative: 0.9 % (ref 0.0–3.0)
Eosinophils Absolute: 0.3 10*3/uL (ref 0.0–0.7)
Eosinophils Relative: 6.1 % — ABNORMAL HIGH (ref 0.0–5.0)
HCT: 47.3 % (ref 39.0–52.0)
Hemoglobin: 15.5 g/dL (ref 13.0–17.0)
Lymphocytes Relative: 39.3 % (ref 12.0–46.0)
Lymphs Abs: 2.1 10*3/uL (ref 0.7–4.0)
MCHC: 32.7 g/dL (ref 30.0–36.0)
MCV: 84.8 fl (ref 78.0–100.0)
Monocytes Absolute: 0.4 10*3/uL (ref 0.1–1.0)
Monocytes Relative: 7.7 % (ref 3.0–12.0)
Neutro Abs: 2.4 10*3/uL (ref 1.4–7.7)
Neutrophils Relative %: 46 % (ref 43.0–77.0)
Platelets: 290 10*3/uL (ref 150.0–400.0)
RBC: 5.58 Mil/uL (ref 4.22–5.81)
RDW: 13.8 % (ref 11.5–15.5)
WBC: 5.2 10*3/uL (ref 4.0–10.5)

## 2020-01-23 LAB — LDL CHOLESTEROL, DIRECT: Direct LDL: 82 mg/dL

## 2020-01-23 LAB — HEMOGLOBIN A1C: Hgb A1c MFr Bld: 8.9 % — ABNORMAL HIGH (ref 4.6–6.5)

## 2020-01-23 LAB — VITAMIN D 25 HYDROXY (VIT D DEFICIENCY, FRACTURES): VITD: 21.23 ng/mL — ABNORMAL LOW (ref 30.00–100.00)

## 2020-01-23 MED ORDER — CHOLECALCIFEROL 1.25 MG (50000 UT) PO CAPS: 50000.0000 [IU] | ORAL_CAPSULE | ORAL | 1 refills | Status: DC

## 2020-01-23 NOTE — Addendum Note (Signed)
Addended by: Gabrielle Wakeland S on: 01/23/2020 04:37 PM ° ° Modules accepted: Orders ° °

## 2020-01-23 NOTE — Addendum Note (Signed)
Addended by: Warden Fillers on: 01/23/2020 04:37 PM   Modules accepted: Orders

## 2020-01-23 NOTE — Progress Notes (Signed)
Chief Complaint  Patient presents with  . Follow-up   F/u  1. DM 2 A1C 8.7 and HTN on hctz 12.5 mg qd and losartan 100 mg qd and metformin 500 mg bid and setglartro 5 mg qd lipitor 20 mg qd    Review of Systems  Constitutional: Negative for weight loss.  HENT: Negative for hearing loss.   Eyes: Negative for blurred vision.  Respiratory: Negative for cough.   Gastrointestinal: Negative for abdominal pain.  Musculoskeletal: Negative for falls and joint pain.  Skin: Negative for rash.  Neurological: Negative for headaches.  Psychiatric/Behavioral: Negative for depression.   Past Medical History:  Diagnosis Date  . Asthma    child  . Diabetes mellitus without complication ([place])   . Hyperlipidemia   . Hypertension   . Morbid obesity ([place])   . Sinus tachycardia    Past Surgical History:  Procedure Laterality Date  . right wrist fracture     Family History  Problem Relation Age of Onset  . Hypertension Mother   . Arthritis Mother   . Asthma Mother   . Depression Mother   . Hyperlipidemia Mother   . Diabetes Mother   . Hypertension Father   . Depression Father   . Hyperlipidemia Father   . Intellectual disability Father   . Stroke Father   . Hepatitis C Father   . Kidney failure Father        s/p transplant   . Heart disease Maternal Grandfather   . Diabetes Paternal Grandmother   . Drug abuse Paternal Grandmother   . Diabetes Maternal Grandmother    Social History   Socioeconomic History  . Marital status: Married    Spouse name: Not on file  . Number of children: Not on file  . Years of education: Not on file  . Highest education level: Not on file  Occupational History  . Not on file  Tobacco Use  . Smoking status: Never Smoker  . Smokeless tobacco: Never Used  Substance and Sexual Activity  . Alcohol use: Yes    Alcohol/week: 1.0 standard drink    Types: 1 Cans of beer per week    Comment: occasional  . Drug use: No  . Sexual activity: Yes  Other  Topics Concern  . Not on file  Social History Narrative   Diploma, Pharmacologist    Married    2 kids 13 y.o boy, 47 month boy, wife pregnant as of 06/20/19    Drinks occasionally    Never smoker, no chew    Owns guns, wears seat belt, safe in relationship       Social Determinants of Health   Financial Resource Strain: Low Risk   . Difficulty of Paying Living Expenses: Not hard at all  Food Insecurity: Not on file  Transportation Needs: Not on file  Physical Activity: Not on file  Stress: Not on file  Social Connections: Not on file  Intimate Partner Violence: Not on file   Current Meds  Medication Sig  . atorvastatin (LIPITOR) 20 MG tablet Take 1 tablet (20 mg total) by mouth daily at 6 PM.  . ertugliflozin L-PyroglutamicAc (STEGLATRO) 5 MG TABS tablet Take 1 tablet (5 mg total) by mouth daily before breakfast.  . fluticasone (FLONASE) 50 MCG/ACT nasal spray Place 1 spray into both nostrils daily as needed for allergies or rhinitis.  Marland Kitchen glucose blood test strip Use as instructed  . hydrochlorothiazide (HYDRODIURIL) 12.5 MG tablet Take 1 tablet (12.5  mg total) by mouth daily. In the am  . loratadine (CLARITIN) 10 MG tablet Take 10 mg by mouth daily.  Marland Kitchen losartan (COZAAR) 100 MG tablet Take 1 tablet (100 mg total) by mouth daily.  . metFORMIN (GLUCOPHAGE) 500 MG tablet Take 1 tablet (500 mg total) by mouth 2 (two) times daily with a meal.  . Omega-3 Fatty Acids (FISH OIL) 1200 MG CPDR Take 1 capsule by mouth.  Letta Pate Delica Lancets 33G MISC 1 Stick by Does not apply route daily.   No Known Allergies No results found for this or any previous visit (from the past 2160 hour(s)). Objective  Body mass index is 57.6 kg/m. Wt Readings from Last 3 Encounters:  01/23/20 (!) 436 lb 9.6 oz (198 kg)  06/20/19 (!) 451 lb (204.6 kg)  04/05/18 (!) 434 lb 12.8 oz (197.2 kg)   Temp Readings from Last 3 Encounters:  01/23/20 97.8 F (36.6 C) (Oral)  06/20/19 (!) 97.1 F (36.2  C) (Temporal)  04/05/18 98.6 F (37 C) (Oral)   BP Readings from Last 3 Encounters:  01/23/20 126/86  06/20/19 130/82  04/05/18 122/86   Pulse Readings from Last 3 Encounters:  01/23/20 (!) 104  06/20/19 100  04/05/18 (!) 111    Physical Exam Vitals reviewed.  Constitutional:      Appearance: Normal appearance. He is well-developed and well-groomed. He is morbidly obese.  HENT:     Head: Normocephalic and atraumatic.  Eyes:     Conjunctiva/sclera: Conjunctivae normal.     Pupils: Pupils are equal, round, and reactive to light.  Cardiovascular:     Rate and Rhythm: Regular rhythm. Tachycardia present.     Heart sounds: Normal heart sounds. No murmur heard.   Pulmonary:     Effort: Pulmonary effort is normal.     Breath sounds: Normal breath sounds.  Skin:    General: Skin is warm and dry.  Neurological:     General: No focal deficit present.     Mental Status: He is alert and oriented to person, place, and time. Mental status is at baseline.     Gait: Gait normal.  Psychiatric:        Attention and Perception: Attention and perception normal.        Mood and Affect: Mood and affect normal.        Speech: Speech normal.        Behavior: Behavior normal. Behavior is cooperative.        Thought Content: Thought content normal.        Cognition and Memory: Cognition and memory normal.        Judgment: Judgment normal.     Assessment  Plan  Hypertension associated with diabetes (HCC) - Plan: Comprehensive metabolic panel, Lipid panel, CBC w/Diff, Hemoglobin A1c, Microalbumin / creatinine urine ratio,  hctz 12.5 mg qd and losartan 100 mg qd and metformin 500 mg bid and setglartro 5 mg qd lipitor 20 mg qd   Morbid obesity with BMI of 50.0-59.9, adult (HCC)rec healthy diet and exercise   Elevated liver enzymes - Plan: Comprehensive metabolic panel  Vitamin D deficiency - Plan: Vitamin D (25 hydroxy)  Declines flu shot? Had 2021 check date of flu shot and call  back  Tdap given today 2/2 moderna  Consider pna 23 vx Had 2/2 hep B vaccines immune recMMR in future not immune  rec healthy diet and exercise  Waist circumference is ~61 inches  Reviewed derm note 05/16/17 given clindamycin  1% lotion and halobetasol topical foam 0.05%  Dr. Nehemiah Massed   Provider: Dr. Olivia Mackie [name]-Internal Medicine

## 2020-01-23 NOTE — Patient Instructions (Addendum)
TEND SKIN  Razor bumps  Results for TAGE, FEGGINS (MRN [id number]) as of 01/23/2020 14:01  Ref. Range 04/11/2017 08:00 08/19/2017 09:20 03/01/2018 15:49 07/11/2018 10:45 06/20/2019 10:56  Hemoglobin A1C Latest Ref Range: 4.8 - 5.6 % 7.0 (H) 7.1 (H) 8.2 (H) 7.6 (H) 8.7 (H)   Nature made cinnamon 500 mg daily   Nasal saline  flonase claritin as needed for nose    Diabetes Mellitus and Nutrition, Adult When you have diabetes (diabetes mellitus), it is very important to have healthy eating habits because your blood sugar (glucose) levels are greatly affected by what you eat and drink. Eating healthy foods in the appropriate amounts, at about the same times every day, can help you:  Control your blood glucose.  Lower your risk of heart disease.  Improve your blood pressure.  Reach or maintain a healthy weight. Every person with diabetes is different, and each person has different needs for a meal plan. Your health care provider may recommend that you work with a diet and nutrition specialist (dietitian) to make a meal plan that is best for you. Your meal plan may vary depending on factors such as:  The calories you need.  The medicines you take.  Your weight.  Your blood glucose, blood pressure, and cholesterol levels.  Your activity level.  Other health conditions you have, such as heart or kidney disease. How do carbohydrates affect me? Carbohydrates, also called carbs, affect your blood glucose level more than any other type of food. Eating carbs naturally raises the amount of glucose in your blood. Carb counting is a method for keeping track of how many carbs you eat. Counting carbs is important to keep your blood glucose at a healthy level, especially if you use insulin or take certain oral diabetes medicines. It is important to know how many carbs you can safely have in each meal. This is different for every person. Your dietitian can help you calculate how many carbs you should have  at each meal and for each snack. Foods that contain carbs include:  Bread, cereal, rice, pasta, and crackers.  Potatoes and corn.  Peas, beans, and lentils.  Milk and yogurt.  Fruit and juice.  Desserts, such as cakes, cookies, ice cream, and candy. How does alcohol affect me? Alcohol can cause a sudden decrease in blood glucose (hypoglycemia), especially if you use insulin or take certain oral diabetes medicines. Hypoglycemia can be a life-threatening condition. Symptoms of hypoglycemia (sleepiness, dizziness, and confusion) are similar to symptoms of having too much alcohol. If your health care provider says that alcohol is safe for you, follow these guidelines:  Limit alcohol intake to no more than 1 drink per day for nonpregnant women and 2 drinks per day for men. One drink equals 12 oz of beer, 5 oz of wine, or 1 oz of hard liquor.  Do not drink on an empty stomach.  Keep yourself hydrated with water, diet soda, or unsweetened iced tea.  Keep in mind that regular soda, juice, and other mixers may contain a lot of sugar and must be counted as carbs. What are tips for following this plan?  Reading food labels  Start by checking the serving size on the "Nutrition Facts" label of packaged foods and drinks. The amount of calories, carbs, fats, and other nutrients listed on the label is based on one serving of the item. Many items contain more than one serving per package.  Check the total grams (g) of carbs in  one serving. You can calculate the number of servings of carbs in one serving by dividing the total carbs by 15. For example, if a food has 30 g of total carbs, it would be equal to 2 servings of carbs.  Check the number of grams (g) of saturated and trans fats in one serving. Choose foods that have low or no amount of these fats.  Check the number of milligrams (mg) of salt (sodium) in one serving. Most people should limit total sodium intake to less than 2,300 mg per  day.  Always check the nutrition information of foods labeled as "low-fat" or "nonfat". These foods may be higher in added sugar or refined carbs and should be avoided.  Talk to your dietitian to identify your daily goals for nutrients listed on the label. Shopping  Avoid buying canned, premade, or processed foods. These foods tend to be high in fat, sodium, and added sugar.  Shop around the outside edge of the grocery store. This includes fresh fruits and vegetables, bulk grains, fresh meats, and fresh dairy. Cooking  Use low-heat cooking methods, such as baking, instead of high-heat cooking methods like deep frying.  Cook using healthy oils, such as olive, canola, or sunflower oil.  Avoid cooking with butter, cream, or high-fat meats. Meal planning  Eat meals and snacks regularly, preferably at the same times every day. Avoid going long periods of time without eating.  Eat foods high in fiber, such as fresh fruits, vegetables, beans, and whole grains. Talk to your dietitian about how many servings of carbs you can eat at each meal.  Eat 4-6 ounces (oz) of lean protein each day, such as lean meat, chicken, fish, eggs, or tofu. One oz of lean protein is equal to: ? 1 oz of meat, chicken, or fish. ? 1 egg. ?  cup of tofu.  Eat some foods each day that contain healthy fats, such as avocado, nuts, seeds, and fish. Lifestyle  Check your blood glucose regularly.  Exercise regularly as told by your health care provider. This may include: ? 150 minutes of moderate-intensity or vigorous-intensity exercise each week. This could be brisk walking, biking, or water aerobics. ? Stretching and doing strength exercises, such as yoga or weightlifting, at least 2 times a week.  Take medicines as told by your health care provider.  Do not use any products that contain nicotine or tobacco, such as cigarettes and e-cigarettes. If you need help quitting, ask your health care provider.  Work with  a Veterinary surgeon or diabetes educator to identify strategies to manage stress and any emotional and social challenges. Questions to ask a health care provider  Do I need to meet with a diabetes educator?  Do I need to meet with a dietitian?  What number can I call if I have questions?  When are the best times to check my blood glucose? Where to find more information:  American Diabetes Association: diabetes.org  Academy of Nutrition and Dietetics: www.eatright.[employer] of Diabetes and Digestive and Kidney Diseases (NIH): CarFlippers.tn Summary  A healthy meal plan will help you control your blood glucose and maintain a healthy lifestyle.  Working with a diet and nutrition specialist (dietitian) can help you make a meal plan that is best for you.  Keep in mind that carbohydrates (carbs) and alcohol have immediate effects on your blood glucose levels. It is important to count carbs and to use alcohol carefully. This information is not intended to replace advice given  to you by your health care provider. Make sure you discuss any questions you have with your health care provider. Document Revised: 01/07/2017 Document Reviewed: 03/01/2016 Elsevier Patient Education  2020 [employer].

## 2020-01-23 NOTE — Addendum Note (Signed)
Addended by: Warden Fillers on: 01/23/2020 04:36 PM   Modules accepted: Orders

## 2020-01-24 ENCOUNTER — Telehealth: Payer: Self-pay

## 2020-01-24 LAB — MEASLES/MUMPS/RUBELLA IMMUNITY
Mumps IgG: <9 AU/mL — ABNORMAL LOW
Rubella: 3.48 Index
Rubeola IgG: 232 AU/mL

## 2020-01-24 LAB — MICROALBUMIN / CREATININE URINE RATIO
Creatinine, Urine: 135 mg/dL (ref 20–320)
Microalb Creat Ratio: 86 mcg/mg creat — ABNORMAL HIGH (ref <30)
Microalb, Ur: 11.6 mg/dL

## 2020-01-24 LAB — HEPATITIS C ANTIBODY
Hepatitis C Ab: NONREACTIVE
SIGNAL TO CUT-OFF: 0.01 (ref <1.00)

## 2020-01-24 NOTE — Telephone Encounter (Signed)
-----   Message from Bevelyn Buckles, MD sent at 01/23/2020  6:32 PM EST ----- Vitamin D low  -sent weekly   Liver enzymes improved   Triglycerides elevated rec nature made fish oil 2000 mg 2x per day with food   Blood cts normal except eosinophils indicate allergies rec meds today at appt otc and disc with pt   A1C 8.9 increased  -we need to increase steglaro from 5 mg to 10  -Is he agreeable?  -catie any other ideas?

## 2020-01-28 ENCOUNTER — Other Ambulatory Visit: Payer: Self-pay | Admitting: Internal Medicine

## 2020-01-28 DIAGNOSIS — E1165 Type 2 diabetes mellitus with hyperglycemia: Secondary | ICD-10-CM

## 2020-01-28 DIAGNOSIS — E1159 Type 2 diabetes mellitus with other circulatory complications: Secondary | ICD-10-CM

## 2020-01-28 MED ORDER — ERTUGLIFLOZIN L-PYROGLUTAMICAC 15 MG PO TABS: 15.0000 mg | ORAL_TABLET | Freq: Every day | ORAL | 3 refills | Status: DC

## 2020-02-07 ENCOUNTER — Telehealth: Payer: Self-pay | Admitting: Internal Medicine

## 2020-02-07 NOTE — Telephone Encounter (Signed)
Rejection Reason - Patient did not respond - No response from pt, never received paperwork from seminar." [facility] said on Feb 06, 2020 10:09 AM  "Spoke to pt; will return paperwork to office asap. " [facility] said on Nov 15, 2019 2:41 PM  "Pt did seminar 08/22/19; Left VM for pt to call back. Needing paperwork returned before scheduling " [facility] said on Nov 15, 2019 12:51 PM  "Given to [facility] said on Jul 19, 2019 3:16 PM  "Given to bari dept. " [facility] said on Jul 19, 2019 3:16 PM

## 2020-03-24 LAB — HM DIABETES EYE EXAM

## 2020-03-25 ENCOUNTER — Encounter: Payer: Self-pay | Admitting: Internal Medicine

## 2020-04-22 ENCOUNTER — Encounter: Payer: Managed Care, Other (non HMO) | Admitting: Internal Medicine

## 2020-04-23 ENCOUNTER — Ambulatory Visit (INDEPENDENT_AMBULATORY_CARE_PROVIDER_SITE_OTHER): Payer: Managed Care, Other (non HMO) | Admitting: Internal Medicine

## 2020-04-23 ENCOUNTER — Encounter: Payer: Self-pay | Admitting: Internal Medicine

## 2020-04-23 ENCOUNTER — Other Ambulatory Visit: Payer: Self-pay

## 2020-04-23 VITALS — BP 140/92 | HR 88 | Temp 98.0°F | Ht 73.03 in | Wt >= 6400 oz

## 2020-04-23 DIAGNOSIS — E785 Hyperlipidemia, unspecified: Secondary | ICD-10-CM | POA: Diagnosis not present

## 2020-04-23 DIAGNOSIS — E538 Deficiency of other specified B group vitamins: Secondary | ICD-10-CM | POA: Diagnosis not present

## 2020-04-23 DIAGNOSIS — E119 Type 2 diabetes mellitus without complications: Secondary | ICD-10-CM

## 2020-04-23 DIAGNOSIS — I10 Essential (primary) hypertension: Secondary | ICD-10-CM | POA: Diagnosis not present

## 2020-04-23 DIAGNOSIS — I152 Hypertension secondary to endocrine disorders: Secondary | ICD-10-CM

## 2020-04-23 DIAGNOSIS — E1159 Type 2 diabetes mellitus with other circulatory complications: Secondary | ICD-10-CM | POA: Diagnosis not present

## 2020-04-23 DIAGNOSIS — E1165 Type 2 diabetes mellitus with hyperglycemia: Secondary | ICD-10-CM

## 2020-04-23 MED ORDER — LORATADINE 10 MG PO TABS: 10.0000 mg | ORAL_TABLET | Freq: Every day | ORAL | 3 refills | Status: DC

## 2020-04-23 MED ORDER — GLUCOSE BLOOD VI STRP: ORAL_STRIP | 12 refills | Status: DC

## 2020-04-23 MED ORDER — ONETOUCH DELICA LANCETS 33G MISC: 1.0000 | Freq: Every day | 12 refills | Status: DC

## 2020-04-23 MED ORDER — HYDROCHLOROTHIAZIDE 12.5 MG PO TABS: 12.5000 mg | ORAL_TABLET | Freq: Every day | ORAL | 3 refills | Status: DC

## 2020-04-23 MED ORDER — LOSARTAN POTASSIUM 100 MG PO TABS: 100.0000 mg | ORAL_TABLET | Freq: Every day | ORAL | 3 refills | Status: DC

## 2020-04-23 MED ORDER — METFORMIN HCL 500 MG PO TABS: 500.0000 mg | ORAL_TABLET | Freq: Two times a day (BID) | ORAL | 3 refills | Status: DC

## 2020-04-23 MED ORDER — ATORVASTATIN CALCIUM 20 MG PO TABS: 20.0000 mg | ORAL_TABLET | Freq: Every day | ORAL | 3 refills | Status: DC

## 2020-04-23 NOTE — Patient Instructions (Addendum)
moderna 3rd dose  Pneumonia 23 vaccine consider here    Tend skin for razor bumps   Pneumococcal Polysaccharide Vaccine (PPSV23): What You Need to Know 1. Why get vaccinated? Pneumococcal polysaccharide vaccine (PPSV23) can prevent pneumococcal disease. Pneumococcal disease refers to any illness caused by pneumococcal bacteria. These bacteria can cause many types of illnesses, including pneumonia, which is an infection of the lungs. Pneumococcal bacteria are one of the most common causes of pneumonia. Besides pneumonia, pneumococcal bacteria can also cause:  Ear infections  Sinus infections  Meningitis (infection of the tissue covering the brain and spinal cord)  Bacteremia (bloodstream infection) Anyone can get pneumococcal disease, but children under 75 years of age, people with certain medical conditions, adults 65 years or older, and cigarette smokers are at the highest risk. Most pneumococcal infections are mild. However, some can result in long-term problems, such as brain damage or hearing loss. Meningitis, bacteremia, and pneumonia caused by pneumococcal disease can be fatal. 2. PPSV23 PPSV23 protects against 23 types of bacteria that cause pneumococcal disease. PPSV23 is recommended for:  All adults 65 years or older,  Anyone 2 years or older with certain medical conditions that can lead to an increased risk for pneumococcal disease. Most people need only one dose of PPSV23. A second dose of PPSV23, and another type of pneumococcal vaccine called PCV13, are recommended for certain high-risk groups. Your health care provider can give you more information. People 65 years or older should get a dose of PPSV23 even if they have already gotten one or more doses of the vaccine before they turned 14. 3. Talk with your health care provider Tell your vaccine provider if the person getting the vaccine:  Has had an allergic reaction after a previous dose of PPSV23, or has any severe,  life-threatening allergies. In some cases, your health care provider may decide to postpone PPSV23 vaccination to a future visit. People with minor illnesses, such as a cold, may be vaccinated. People who are moderately or severely ill should usually wait until they recover before getting PPSV23. Your health care provider can give you more information. 4. Risks of a vaccine reaction  Redness or pain where the shot is given, feeling tired, fever, or muscle aches can happen after PPSV23. People sometimes faint after medical procedures, including vaccination. Tell your provider if you feel dizzy or have vision changes or ringing in the ears. As with any medicine, there is a very remote chance of a vaccine causing a severe allergic reaction, other serious injury, or death. 5. What if there is a serious problem? An allergic reaction could occur after the vaccinated person leaves the clinic. If you see signs of a severe allergic reaction (hives, swelling of the face and throat, difficulty breathing, a fast heartbeat, dizziness, or weakness), call 9-1-1 and get the person to the nearest hospital. For other signs that concern you, call your health care provider. Adverse reactions should be reported to the Vaccine Adverse Event Reporting System (VAERS). Your health care provider will usually file this report, or you can do it yourself. Visit the VAERS website at www.vaers.LAgents.no or call (769)878-3510. VAERS is only for reporting reactions, and VAERS staff do not give medical advice. 6. How can I learn more?  Ask your health care provider.  Call your local or state health department.  Contact the Centers for Disease Control and Prevention (CDC): ? Call (445)302-4971 (1-800-CDC-INFO) or ? Visit CDC's website at PicCapture.uy Vaccine Information Statement PPSV23 Vaccine (12/07/2017) This  information is not intended to replace advice given to you by your health care provider. Make sure you  discuss any questions you have with your health care provider. Document Revised: 09/28/2019 Document Reviewed: 09/28/2019 Elsevier Patient Education  2021 [employer].

## 2020-04-23 NOTE — Progress Notes (Signed)
Chief Complaint  Patient presents with  . Annual Exam  . Medication Management    Steglatro medication price went up when insurance formulary changed at the beginning of the year. Patient is no longer taking this.    F/u  1. HTN elevated losartan 100 mg qd hctz 12.5 mg qd  2. 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  HENT: Negative for hearing loss.   Eyes: Negative for blurred vision.  Respiratory: Negative for shortness of breath.   Cardiovascular: Negative for chest pain.  Gastrointestinal: Negative for abdominal pain.  Musculoskeletal: Negative for falls and joint pain.  Neurological: Negative for headaches.  Psychiatric/Behavioral: Negative for depression.   Past Medical History:  Diagnosis Date  . Asthma    child  . Diabetes mellitus without complication ([place])   . Hyperlipidemia   . Hypertension   . Morbid obesity ([place])   . Sinus tachycardia    Past Surgical History:  Procedure Laterality Date  . right wrist fracture     Family History  Problem Relation Age of Onset  . Hypertension Mother   . Arthritis Mother   . Asthma Mother   . Depression Mother   . Hyperlipidemia Mother   . Diabetes Mother   . Hypertension Father   . Depression Father   . Hyperlipidemia Father   . Intellectual disability Father   . Stroke Father   . Hepatitis C Father   . Kidney failure Father        s/p transplant   . Heart disease Maternal Grandfather   . Diabetes Paternal Grandmother   . Drug abuse Paternal Grandmother   . Diabetes Maternal Grandmother    Social History   Socioeconomic History  . Marital status: Married    Spouse name: Not on file  . Number of children: Not on file  . Years of education: Not on file  . Highest education level: Not on file  Occupational  History  . Not on file  Tobacco Use  . Smoking status: Never Smoker  . Smokeless tobacco: Never Used  Substance and Sexual Activity  . Alcohol use: Yes    Alcohol/week: 1.0 standard drink    Types: 1 Cans of beer per week    Comment: occasional  . Drug use: No  . Sexual activity: Yes  Other Topics Concern  . Not on file  Social History Narrative   Diploma, Pharmacologist    Married    2 kids 29 y.o boy, 22 month boy, wife pregnant as of 06/20/19    -as of 01/23/20 pt has 3 boys       Drinks occasionally    Never smoker, no chew    Owns guns, wears seat belt, safe in relationship       Social Determinants of Health   Financial Resource Strain: Low Risk   . Difficulty of Paying Living Expenses: Not hard at all  Food Insecurity: Not on file  Transportation Needs: Not on file  Physical Activity: Not on file  Stress: Not on file  Social Connections: Not on file  Intimate Partner Violence: Not on file   Current Meds  Medication Sig  . APPLE CIDER VINEGAR PO Take by mouth.  . Ascorbic Acid (VITAMIN C  PO) Take by mouth.  . ASHWAGANDHA PO Take by mouth.  . Cholecalciferol 1.25 MG (50000 UT) capsule Take 1 capsule (50,000 Units total) by mouth once a week.  . fluticasone (FLONASE) 50 MCG/ACT nasal spray Place 1 spray into both nostrils daily as needed for allergies or rhinitis.  . Multiple Vitamin (MULTIVITAMIN PO) Take by mouth.  . Omega-3 Fatty Acids (FISH OIL) 1200 MG CPDR Take 1 capsule by mouth.  Marland Kitchen OVER THE COUNTER MEDICATION [place] super fruit and veggi gummies  . [DISCONTINUED] atorvastatin (LIPITOR) 20 MG tablet Take 1 tablet (20 mg total) by mouth daily at 6 PM.  . [DISCONTINUED] glucose blood test strip Use as instructed  . [DISCONTINUED] hydrochlorothiazide (HYDRODIURIL) 12.5 MG tablet Take 1 tablet (12.5 mg total) by mouth daily. In the am  . [DISCONTINUED] loratadine (CLARITIN) 10 MG tablet Take 10 mg by mouth daily.  . [DISCONTINUED] losartan (COZAAR) 100 MG  tablet Take 1 tablet (100 mg total) by mouth daily.  . [DISCONTINUED] metFORMIN (GLUCOPHAGE) 500 MG tablet Take 1 tablet (500 mg total) by mouth 2 (two) times daily with a meal.  . [DISCONTINUED] OneTouch Delica Lancets 51Z MISC 1 Stick by Does not apply route daily.   No Known Allergies Recent Results (from the past 2160 hour(s))  HM DIABETES EYE EXAM     Status: None   Collection Time: 03/24/20 12:00 AM  Result Value Ref Range   HM Diabetic Eye Exam No Retinopathy No Retinopathy    Comment: Dr. Kerin Ransom negative    Objective  Body mass index is 55.79 kg/m. Wt Readings from Last 3 Encounters:  04/23/20 (!) 423 lb 3.2 oz (192 kg)  01/23/20 (!) 436 lb 9.6 oz (198 kg)  06/20/19 (!) 451 lb (204.6 kg)   Temp Readings from Last 3 Encounters:  04/23/20 98 F (36.7 C) (Oral)  01/23/20 97.8 F (36.6 C) (Oral)  06/20/19 (!) 97.1 F (36.2 C) (Temporal)   BP Readings from Last 3 Encounters:  04/23/20 (!) 140/92  01/23/20 126/86  06/20/19 130/82   Pulse Readings from Last 3 Encounters:  04/23/20 88  01/23/20 (!) 104  06/20/19 100    Physical Exam Vitals and nursing note reviewed.  Constitutional:      Appearance: Normal appearance. He is well-developed and well-groomed. He is morbidly obese.  HENT:     Head: Normocephalic and atraumatic.  Eyes:     Conjunctiva/sclera: Conjunctivae normal.     Pupils: Pupils are equal, round, and reactive to light.  Cardiovascular:     Rate and Rhythm: Normal rate and regular rhythm.     Heart sounds: Normal heart sounds. No murmur heard.   Pulmonary:     Effort: Pulmonary effort is normal.     Breath sounds: Normal breath sounds.  Skin:    General: Skin is warm and dry.  Neurological:     General: No focal deficit present.     Mental Status: He is alert and oriented to person, place, and time. Mental status is at baseline.     Gait: Gait normal.  Psychiatric:        Attention and Perception: Attention and perception normal.         Mood and Affect: Mood and affect normal.        Speech: Speech normal.        Behavior: Behavior normal. Behavior is cooperative.        Thought Content: Thought content normal.        Cognition  and Memory: Cognition and memory normal.        Judgment: Judgment normal.     Assessment  Plan  Hypertension associated with diabetes ([place]) - Plan: Comprehensive metabolic panel, Lipid panel, Hemoglobin A1c, Insulin, random losartan 100 mg qd hctz 12.5 mg qd  2. 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 not taking steglartro 15 mg due to cost on metformin 500 mg bid and lipitor 20 mg qhs   Declines pna 23 vaccine for now wants more info   Hyperlipidemia, unspecified hyperlipidemia type - Plan: atorvastatin (LIPITOR) 20 MG tablet  Essential hypertension - Plan: hydrochlorothiazide (HYDRODIURIL) 12.5 MG tablet, losartan (COZAAR) 100 MG tablet No BP meds today so elevated   HM-CPE due 06/2020   Declines flu  Tdap utd 2/2 moderna not had 3rd moderna as of 04/23/20  Consider pna 23 vx Had2/2 hepBvaccines immune recMMR in future not immune  rec healthy diet and exercise  Waist circumference is~61 inchesprior to 04/23/20 losing wt via about and trying   Reviewed derm note 05/16/17 given clindamycin 1% lotion and halobetasol topical foam 0.05%, tend skin for razor irritation neck  Dr. Nehemiah Massed  Provider: Dr. Olivia Mackie [name]-Internal Medicine

## 2020-04-24 LAB — COMPREHENSIVE METABOLIC PANEL
ALT: 93 IU/L — ABNORMAL HIGH (ref 0–44)
AST: 53 IU/L — ABNORMAL HIGH (ref 0–40)
Albumin/Globulin Ratio: 2 (ref 1.2–2.2)
Albumin: 4.7 g/dL (ref 4.1–5.2)
Alkaline Phosphatase: 60 IU/L (ref 44–121)
BUN/Creatinine Ratio: 12 (ref 9–20)
BUN: 9 mg/dL (ref 6–20)
Bilirubin Total: 0.5 mg/dL (ref 0.0–1.2)
CO2: 21 mmol/L (ref 20–29)
Calcium: 9.5 mg/dL (ref 8.7–10.2)
Chloride: 99 mmol/L (ref 96–106)
Creatinine, Ser: 0.75 mg/dL — ABNORMAL LOW (ref 0.76–1.27)
Globulin, Total: 2.4 g/dL (ref 1.5–4.5)
Glucose: 170 mg/dL — ABNORMAL HIGH (ref 65–99)
Potassium: 4.4 mmol/L (ref 3.5–5.2)
Sodium: 141 mmol/L (ref 134–144)
Total Protein: 7.1 g/dL (ref 6.0–8.5)
eGFR: 125 mL/min/{1.73_m2} (ref >59)

## 2020-04-24 LAB — LIPID PANEL
Chol/HDL Ratio: 5.4 ratio — ABNORMAL HIGH (ref 0.0–5.0)
Cholesterol, Total: 220 mg/dL — ABNORMAL HIGH (ref 100–199)
HDL: 41 mg/dL (ref >39)
LDL Chol Calc (NIH): 151 mg/dL — ABNORMAL HIGH (ref 0–99)
Triglycerides: 155 mg/dL — ABNORMAL HIGH (ref 0–149)
VLDL Cholesterol Cal: 28 mg/dL (ref 5–40)

## 2020-04-24 LAB — INSULIN, RANDOM: INSULIN: 16.1 u[IU]/mL (ref 2.6–24.9)

## 2020-04-24 LAB — HEMOGLOBIN A1C
Est. average glucose Bld gHb Est-mCnc: 209 mg/dL
Hgb A1c MFr Bld: 8.9 % — ABNORMAL HIGH (ref 4.8–5.6)

## 2020-04-24 LAB — VITAMIN B12: Vitamin B-12: 1281 pg/mL — ABNORMAL HIGH (ref 232–1245)

## 2020-05-07 ENCOUNTER — Other Ambulatory Visit: Payer: Self-pay | Admitting: Internal Medicine

## 2020-05-07 DIAGNOSIS — I152 Hypertension secondary to endocrine disorders: Secondary | ICD-10-CM

## 2020-05-07 MED ORDER — METFORMIN HCL ER 500 MG PO TB24: 1000.0000 mg | ORAL_TABLET | Freq: Two times a day (BID) | ORAL | 3 refills | Status: DC

## 2020-06-06 ENCOUNTER — Ambulatory Visit: Payer: Managed Care, Other (non HMO) | Admitting: Pharmacist

## 2020-06-06 DIAGNOSIS — I152 Hypertension secondary to endocrine disorders: Secondary | ICD-10-CM

## 2020-06-06 DIAGNOSIS — E785 Hyperlipidemia, unspecified: Secondary | ICD-10-CM

## 2020-06-06 DIAGNOSIS — E119 Type 2 diabetes mellitus without complications: Secondary | ICD-10-CM

## 2020-06-06 NOTE — Chronic Care Management (AMB) (Signed)
Care Management   Pharmacy Note  06/06/2020 Name: Rickey Payne MRN: [id number] DOB: 06/05/1990  Subjective: Rickey Payne is a 30 y.o. year old male who is a primary care patient of [name], [name], MD. The Care Management team was consulted for assistance with care management and care coordination needs.    Engaged with patient by telephone for follow up visit in response to provider referral for pharmacy case management and/or care coordination services.   The patient was given information about Care Management services today including:  1. Care Management services includes personalized support from designated clinical staff supervised by the patient's primary care provider, including individualized plan of care and coordination with other care providers. 2. 24/7 contact phone numbers for assistance for urgent and routine care needs. 3. The patient may stop case management services at any time by phone call to the office staff.  Patient agreed to services and consent obtained.  Assessment:  Review of patient status, including review of consultants reports, laboratory and other test data, was performed as part of comprehensive evaluation and provision of chronic care management services.   SDOH (Social Determinants of Health) assessments and interventions performed:    Objective:  Lab Results  Component Value Date   CREATININE 0.75 (L) 04/23/2020   CREATININE 0.74 01/23/2020   CREATININE 0.63 (L) 06/20/2019    Lab Results  Component Value Date   HGBA1C 8.9 (H) 04/23/2020       Component Value Date/Time   CHOL 220 (H) 04/23/2020 0813   TRIG 155 (H) 04/23/2020 0813   HDL 41 04/23/2020 0813   CHOLHDL 5.4 (H) 04/23/2020 0813   CHOLHDL 4 01/23/2020 1421   VLDL 77.8 (H) 01/23/2020 1421   LDLCALC 151 (H) 04/23/2020 0813   LDLDIRECT 82.0 01/23/2020 1421    Clinical ASCVD: No   BP Readings from Last 3 Encounters:  04/23/20 (!) 140/92  01/23/20 126/86  06/20/19  130/82    Care Plan  No Known Allergies  Medications Reviewed Today    Reviewed by Lourena Simmonds, RPH-CPP (Pharmacist) on 06/06/20 at 1315  Med List Status: <None>  Medication Order Taking? Sig Documenting Provider Last Dose Status Informant  atorvastatin (LIPITOR) 20 MG tablet [id number] Yes Take 1 tablet (20 mg total) by mouth daily at 6 PM. [name], [name], MD Taking Active   Cholecalciferol 1.25 MG (50000 UT) capsule [id number] No Take 1 capsule (50,000 Units total) by mouth once a week.  Patient not taking: Reported on 06/06/2020   [name], [name], MD Not Taking Active   fluticasone [facility]) 50 MCG/ACT nasal spray [id number] Yes Place 1 spray into both nostrils daily as needed for allergies or rhinitis. [provider] Taking Active Self  glucose blood test strip [id number]  Use as instructed check 3x per day [name], [name], MD  Active   hydrochlorothiazide (HYDRODIURIL) 12.5 MG tablet [id number] Yes Take 1 tablet (12.5 mg total) by mouth daily. In the am [name], [name], MD Taking Active   loratadine (CLARITIN) 10 MG tablet [id number] Yes Take 1 tablet (10 mg total) by mouth daily. [name], [name], MD Taking Active   losartan (COZAAR) 100 MG tablet [id number] Yes Take 1 tablet (100 mg total) by mouth daily. [name], [name], MD Taking Active   metFORMIN (GLUCOPHAGE XR) 500 MG 24 hr tablet [id number] Yes Take 2 tablets (1,000 mg total) by mouth 2 (two) times daily with a meal. [name], [name], MD Taking Active   Multiple Vitamin (  MULTIVITAMIN PO) [id number] Yes Take by mouth. [provider] Taking Active   Omega-3 Fatty Acids (FISH OIL) 1200 MG CPDR [id number] Yes Take 1 capsule by mouth. [provider] Taking Active   OneTouch Delica Lancets 33G MISC [id number]  1 Stick by Does not apply route daily. Tid [name], [name], MD  Active   Med List Note Warden Fillers, [facility] 07/11/18 0831): [facility]           Patient Active Problem List   Diagnosis Date Noted  . Hypertension associated with diabetes (HCC) 07/16/2019  . Elevated liver enzymes 08/04/2018  . Annual physical exam 04/05/2018  . Hyperlipidemia 04/05/2018  . OSA (obstructive sleep apnea) 03/07/2018  . HTN (hypertension) 01/25/2018  . Seasonal allergies 05/20/2017  . Vitamin D deficiency 05/20/2017  . DM (diabetes mellitus), type 2 (HCC) 05/20/2017  . Morbid obesity with BMI of 50.0-59.9, adult (HCC) 05/20/2017  . Acne keloidalis nuchae 05/20/2017    Conditions to be addressed/monitored: HTN, HLD and DMII  Care Plan : Medication Management  Updates made by Lourena Simmonds, RPH-CPP since 06/06/2020 12:00 AM    Problem: Diabetes, HTN, HLD     Goal: Disease Progression Prevention   Start Date: 06/06/2020  This Visit's Progress: On track  Priority: High  Note:   Current Barriers:  . Unable to achieve control of diabetes  . High deductible health plan  Pharmacist Clinical Goal(s):  Marland Kitchen Over the next 90 days, patient will achieve control of diabetes as evidenced by A1c  through collaboration with PharmD and provider.   Interventions: . 1:1 collaboration with [name], [name], MD regarding development and update of comprehensive plan of care as evidenced by provider attestation and co-signature . Inter-disciplinary care team collaboration (see longitudinal plan of care) . Comprehensive medication review performed; medication list updated in electronic medical record  Diabetes: . Uncontrolled; current treatment: metformin XR 500 mg BID o High deductible plan makes GLP1/SGLT2 difficult to afford . Current glucose readings: fasting glucose: 120-130s, post prandial glucose: not checking . Current meal patterns: had been doing keto diet per trainer, but notes this has been upsetting his stomach, so thinking about changing breakfast: skips; lunch: vegetables, fish; dinner: chicken burrito bowl; drinks: water  with flavoring packets . Current exercise: works with trainer at gym . Increase metformin to 1000 mg BID as previously prescribed. Start by adding 1 tablet with supper for 1 week, then add second tablet with breakfast.  . Discussed negative impact of keto on lipids (see below), and that keto is not specifically recommended for control of diabetes given lack of long term effectiveness. Discussed focusing on lean proteins, fruits and vegetables, lower carbs, and fibers.   Hypertension: . Controlled; current treatment: losartan 100 mg daily, HCTZ 12.5 mg daily . Recommended to continue current regimen at this time  Hyperlipidemia: . Uncontrolled, worsened by recent dietary changes; current treatment: atorvastatin 20 mg daily, though indicates he often forgets to take; omega 3 fatty acids . Current dietary patterns: had been doing keto diet . Reviewed increase in LDL recently likely attributed to keto diet. Patient plans to stop keto specific diet.  . Discussed importance of atorvastatin adherence. Discussed that this medication can be taken in the morning. He notes he will try to do that to see if that helps him remember to take it . Follow future lipids. Recommend increase to 40 mg daily if lipids still uncontrolled even with improved adherence and dietary changes.   Allergies: . Exacerbated by  pollen; fluticasone nasal spray, loratadine 10 mg daily . Recommend to continue current regimen at this time  Supplements: Marland Kitchen Multivitamin. Reports he finished course of weekly Vitamin D.    Patient Goals/Self-Care Activities . Over the next 90 days, patient will:  - take medications as prescribed check glucose twice daily, document, and provide at future appointments collaborate with provider on medication access solutions target a minimum of 150 minutes of moderate intensity exercise weekly engage in dietary modifications by reducing carbohydrate intake  Follow Up Plan: Telephone follow up  appointment with care management team member scheduled for: ~ 6 weeks      Medication Assistance:  None required.  Patient affirms current coverage meets needs.  Follow Up:  Patient agrees to Care Plan and Follow-up.  Plan: Telephone follow up appointment with care management team member scheduled for:  ~ 6 weeks  Catie Feliz Beam, PharmD, [place], CPP Clinical Pharmacist [employer] at [employer] [phone]

## 2020-06-06 NOTE — Patient Instructions (Signed)
Visit Information  Goals Addressed              This Visit's Progress     Patient Stated   .  Disease Management (pt-stated)        Patient Goals/Self-Care Activities . Over the next 90 days, patient will:  - take medications as prescribed check glucose twice daily, document, and provide at future appointments collaborate with provider on medication access solutions target a minimum of 150 minutes of moderate intensity exercise weekly engage in dietary modifications by reducing carbohydrate intake       Patient verbalizes understanding of instructions provided today and agrees to view in MyChart.   Plan: Telephone follow up appointment with care management team member scheduled for:  ~ 6 weeks  Catie Feliz Beam, PharmD, [place], CPP Clinical Pharmacist [employer] at [employer] [phone]

## 2020-07-24 ENCOUNTER — Ambulatory Visit: Payer: Managed Care, Other (non HMO) | Admitting: Pharmacist

## 2020-07-24 DIAGNOSIS — E1159 Type 2 diabetes mellitus with other circulatory complications: Secondary | ICD-10-CM

## 2020-07-24 DIAGNOSIS — E785 Hyperlipidemia, unspecified: Secondary | ICD-10-CM

## 2020-07-24 DIAGNOSIS — E119 Type 2 diabetes mellitus without complications: Secondary | ICD-10-CM

## 2020-07-24 NOTE — Chronic Care Management (AMB) (Signed)
Care Management   Pharmacy Note  07/24/2020 Name: Rickey Payne MRN: [id number] DOB: March 10, 1990  Subjective: Rickey Payne is a 30 y.o. year old male who is a primary care patient of [name], [name], MD. The Care Management team was consulted for assistance with care management and care coordination needs.    Engaged with patient by telephone for follow up visit in response to provider referral for pharmacy case management and/or care coordination services.   The patient was given information about Care Management services today including:  Care Management services includes personalized support from designated clinical staff supervised by the patient's primary care provider, including individualized plan of care and coordination with other care providers. 24/7 contact phone numbers for assistance for urgent and routine care needs. The patient may stop case management services at any time by phone call to the office staff.  Patient agreed to services and consent obtained.  Assessment:  Review of patient status, including review of consultants reports, laboratory and other test data, was performed as part of comprehensive evaluation and provision of chronic care management services.   SDOH (Social Determinants of Health) assessments and interventions performed: none   Objective:  Lab Results  Component Value Date   CREATININE 0.75 (L) 04/23/2020   CREATININE 0.74 01/23/2020   CREATININE 0.63 (L) 06/20/2019    Lab Results  Component Value Date   HGBA1C 8.9 (H) 04/23/2020       Component Value Date/Time   CHOL 220 (H) 04/23/2020 0813   TRIG 155 (H) 04/23/2020 0813   HDL 41 04/23/2020 0813   CHOLHDL 5.4 (H) 04/23/2020 0813   CHOLHDL 4 01/23/2020 1421   VLDL 77.8 (H) 01/23/2020 1421   LDLCALC 151 (H) 04/23/2020 0813   LDLDIRECT 82.0 01/23/2020 1421    Clinical ASCVD: No  The ASCVD Risk score Denman George DC Jr., et al., 2013) failed to calculate for the following  reasons:   The 2013 ASCVD risk score is only valid for ages 11 to 78     BP Readings from Last 3 Encounters:  04/23/20 (!) 140/92  01/23/20 126/86  06/20/19 130/82    Care Plan  No Known Allergies  Medications Reviewed Today     Reviewed by Lourena Simmonds, RPH-CPP (Pharmacist) on 07/24/20 at 1437  Med List Status: <None>   Medication Order Taking? Sig Documenting Provider Last Dose Status Informant  atorvastatin (LIPITOR) 20 MG tablet [id number] Yes Take 1 tablet (20 mg total) by mouth daily at 6 PM. [name], [name], MD Taking Active   fluticasone (FLONASE) 50 MCG/ACT nasal spray [id number] Yes Place 1 spray into both nostrils daily as needed for allergies or rhinitis. [provider] Taking Active Self  glucose blood test strip [id number]  Use as instructed check 3x per day [name], [name], MD  Active   hydrochlorothiazide (HYDRODIURIL) 12.5 MG tablet [id number] Yes Take 1 tablet (12.5 mg total) by mouth daily. In the am [name], [name], MD Taking Active   loratadine (CLARITIN) 10 MG tablet [id number] Yes Take 1 tablet (10 mg total) by mouth daily. [name], [name], MD Taking Active   losartan (COZAAR) 100 MG tablet [id number] Yes Take 1 tablet (100 mg total) by mouth daily. [name], [name], MD Taking Active   metFORMIN (GLUCOPHAGE XR) 500 MG 24 hr tablet [id number] Yes Take 2 tablets (1,000 mg total) by mouth 2 (two) times daily with a meal. [name], [name], MD Taking Active   Multiple Vitamin (MULTIVITAMIN PO) [id number]  Yes Take by mouth. [provider] Taking Active   Omega-3 Fatty Acids (FISH OIL) 1200 MG CPDR [id number] Yes Take 1 capsule by mouth. [provider] Taking Active   OneTouch Delica Lancets 33G MISC [id number]  1 Stick by Does not apply route daily. Tid [name], [name], MD  Active   Med List Note Warden Fillers, [place] 07/11/18 2595): [facility]            Patient Active Problem  List   Diagnosis Date Noted   Hypertension associated with diabetes (HCC) 07/16/2019   Elevated liver enzymes 08/04/2018   Annual physical exam 04/05/2018   Hyperlipidemia 04/05/2018   OSA (obstructive sleep apnea) 03/07/2018   HTN (hypertension) 01/25/2018   Seasonal allergies 05/20/2017   Vitamin D deficiency 05/20/2017   DM (diabetes mellitus), type 2 (HCC) 05/20/2017   Morbid obesity with BMI of 50.0-59.9, adult (HCC) 05/20/2017   Acne keloidalis nuchae 05/20/2017    Conditions to be addressed/monitored: HTN, HLD, and DMII  Care Plan : Medication Management  Updates made by Lourena Simmonds, RPH-CPP since 07/24/2020 12:00 AM     Problem: Diabetes, HTN, HLD      Goal: Disease Progression Prevention   Start Date: 06/06/2020  This Visit's Progress: On track  Recent Progress: On track  Priority: High  Note:   Current Barriers:  Unable to achieve control of diabetes  High deductible health plan  Pharmacist Clinical Goal(s):  Over the next 90 days, patient will achieve control of diabetes as evidenced by A1c  through collaboration with PharmD and provider.   Interventions: 1:1 collaboration with [name], [name], MD regarding development and update of comprehensive plan of care as evidenced by provider attestation and co-signature Inter-disciplinary care team collaboration (see longitudinal plan of care) Comprehensive medication review performed; medication list updated in electronic medical record  Diabetes: Uncontrolled; current treatment: metformin XR 1000 mg BID High deductible plan makes GLP1/SGLT2 difficult to afford Current glucose readings: fasting glucose: 90-120s, post prandial glucose: not checking Current meal patterns: breakfast: skips; lunch: vegetables, fish; dinner: chicken burrito bowl; drinks: water with flavoring packets Current exercise: works with trainer at gym Continue current regimen at this time. Follow up with upcoming PCP visit.    Hypertension: Controlled; current treatment: losartan 100 mg daily, HCTZ 12.5 mg daily Recommended to continue current regimen at this time  Hyperlipidemia: Uncontrolled, but anticipated to be improved; current treatment: atorvastatin 20 mg daily- moving to morning administration significant improved adherence Continue current regimen at this time. Updated lab work with upcoming PCP visit.   Allergies: Controlled per patient report; fluticasone nasal spray, loratadine 10 mg daily Recommend to continue current regimen at this time  Supplements: Multivitamin. Reports he finished course of weekly Vitamin D.    Patient Goals/Self-Care Activities Over the next 90 days, patient will:  - take medications as prescribed check glucose twice daily, document, and provide at future appointments collaborate with provider on medication access solutions target a minimum of 150 minutes of moderate intensity exercise weekly engage in dietary modifications by reducing carbohydrate intake  Follow Up Plan: Telephone follow up appointment with care management team member scheduled for: ~ 10 weeks      Medication Assistance:  None required.  Patient affirms current coverage meets needs.  Follow Up:  Patient agrees to Care Plan and Follow-up.  Plan: Telephone follow up appointment with care management team member scheduled for:  ~ 10 weeks  Catie Feliz Beam, PharmD, [place], CPP Clinical Pharmacist [employer]  at [facility] [phone]

## 2020-07-24 NOTE — Patient Instructions (Signed)
Visit Information  PATIENT GOALS:  Goals Addressed               This Visit's Progress     Patient Stated     Disease Management (pt-stated)        Patient Goals/Self-Care Activities Over the next 90 days, patient will:  - take medications as prescribed check glucose twice daily, document, and provide at future appointments collaborate with provider on medication access solutions target a minimum of 150 minutes of moderate intensity exercise weekly engage in dietary modifications by reducing carbohydrate intake          Patient verbalizes understanding of instructions provided today and agrees to view in MyChart.  Plan: Telephone follow up appointment with care management team member scheduled for:  ~ 10 weeks  Catie Feliz Beam, PharmD, [place], CPP Clinical Pharmacist [employer] at [employer] [phone]

## 2020-08-26 ENCOUNTER — Ambulatory Visit: Payer: Managed Care, Other (non HMO) | Admitting: Internal Medicine

## 2020-09-09 ENCOUNTER — Ambulatory Visit: Payer: Managed Care, Other (non HMO) | Admitting: Internal Medicine

## 2020-09-17 ENCOUNTER — Encounter: Payer: Self-pay | Admitting: Internal Medicine

## 2020-09-17 ENCOUNTER — Other Ambulatory Visit: Payer: Self-pay

## 2020-09-17 ENCOUNTER — Telehealth: Payer: Managed Care, Other (non HMO) | Admitting: Internal Medicine

## 2020-09-17 VITALS — Ht 73.0 in | Wt >= 6400 oz

## 2020-09-17 DIAGNOSIS — E669 Obesity, unspecified: Secondary | ICD-10-CM

## 2020-09-17 DIAGNOSIS — E1159 Type 2 diabetes mellitus with other circulatory complications: Secondary | ICD-10-CM

## 2020-09-18 NOTE — Progress Notes (Signed)
Provider running later for appt 09/17/20 called pt no answer lm will try to resch appt am for fasting labs and MD appt

## 2020-10-06 ENCOUNTER — Telehealth: Payer: Managed Care, Other (non HMO)

## 2020-10-06 ENCOUNTER — Telehealth: Payer: Self-pay | Admitting: Pharmacist

## 2020-10-06 NOTE — Telephone Encounter (Signed)
  Chronic Care Management   Note  10/06/2020 Name: Rickey Payne MRN: [id number] DOB: 01/05/1991   Attempted to contact patient for scheduled appointment for medication management support. Patient requested that we reschedule for next Wednesday.   Catie Feliz Beam, PharmD, [place], CPP Clinical Pharmacist [employer] at [employer] [phone]

## 2020-10-15 ENCOUNTER — Ambulatory Visit: Payer: Managed Care, Other (non HMO) | Admitting: Pharmacist

## 2020-10-15 DIAGNOSIS — I10 Essential (primary) hypertension: Secondary | ICD-10-CM

## 2020-10-15 DIAGNOSIS — E785 Hyperlipidemia, unspecified: Secondary | ICD-10-CM

## 2020-10-15 DIAGNOSIS — E119 Type 2 diabetes mellitus without complications: Secondary | ICD-10-CM

## 2020-10-15 MED ORDER — ATORVASTATIN CALCIUM 20 MG PO TABS: 20.0000 mg | ORAL_TABLET | Freq: Every day | ORAL | 3 refills | Status: DC

## 2020-10-15 NOTE — Patient Instructions (Signed)
Visit Information   Goals Addressed               This Visit's Progress     Patient Stated     Disease Management (pt-stated)        Patient Goals/Self-Care Activities Over the next 90 days, patient will:  - take medications as prescribed check glucose twice daily, document, and provide at future appointments collaborate with provider on medication access solutions target a minimum of 150 minutes of moderate intensity exercise weekly engage in dietary modifications by reducing carbohydrate intake         Patient verbalizes understanding of instructions provided today and agrees to view in MyChart.  Plan: Follow up with PCP as scheduled  Catie Feliz Beam, PharmD, [place], CPP Clinical Pharmacist [employer] at [facility] [phone]

## 2020-10-15 NOTE — Chronic Care Management (AMB) (Signed)
Care Management   Pharmacy Note  10/15/2020 Name: SHNEUR WHITTENBURG MRN: [id number] DOB: [birth year]  Subjective: [name] is a 30 y.o. year old male who is a primary care patient of [name], [name], MD. The Care Management team was consulted for assistance with care management and care coordination needs.    Engaged with patient by telephone for follow up visit in response to provider referral for pharmacy case management and/or care coordination services.   The patient was given information about Care Management services today including:  Care Management services includes personalized support from designated clinical staff supervised by the patient's primary care provider, including individualized plan of care and coordination with other care providers. 24/7 contact phone numbers for assistance for urgent and routine care needs. The patient may stop case management services at any time by phone call to the office staff.  Patient agreed to services and consent obtained.  Assessment:  Review of patient status, including review of consultants reports, laboratory and other test data, was performed as part of comprehensive evaluation and provision of chronic care management services.   SDOH (Social Determinants of Health) assessments and interventions performed:  SDOH Interventions    Flowsheet Row Most Recent Value  SDOH Interventions   Financial Strain Interventions Other (Comment)  [high deductible plan]        Objective:  Lab Results  Component Value Date   CREATININE 0.75 (L) 04/23/2020   CREATININE 0.74 01/23/2020   CREATININE 0.63 (L) 06/20/2019    Lab Results  Component Value Date   HGBA1C 8.9 (H) 04/23/2020       Component Value Date/Time   CHOL 220 (H) 04/23/2020 0813   TRIG 155 (H) 04/23/2020 0813   HDL 41 04/23/2020 0813   CHOLHDL 5.4 (H) 04/23/2020 0813   CHOLHDL 4 01/23/2020 1421   VLDL 77.8 (H) 01/23/2020 1421   LDLCALC 151 (H) 04/23/2020 0813    LDLDIRECT 82.0 01/23/2020 1421    Clinical ASCVD: No  The ASCVD Risk score Denman George DC Jr., et al., 2013) failed to calculate for the following reasons:   The 2013 ASCVD risk score is only valid for ages 32 to 55      BP Readings from Last 3 Encounters:  04/23/20 (!) 140/92  01/23/20 126/86  06/20/19 130/82    Care Plan  No Known Allergies  Medications Reviewed Today     Reviewed by Lourena Simmonds, RPH-CPP (Pharmacist) on 10/15/20 at 1436  Med List Status: <None>   Medication Order Taking? Sig Documenting Provider Last Dose Status Informant  atorvastatin (LIPITOR) 20 MG tablet [id number] Yes Take 1 tablet (20 mg total) by mouth daily at 6 PM. [name], [name], MD Taking Active   fluticasone (FLONASE) 50 MCG/ACT nasal spray [id number] Yes Place 1 spray into both nostrils daily as needed for allergies or rhinitis. [provider] Taking Active Self  glucose blood test strip [id number] Yes Use as instructed check 3x per day [name], [name], MD Taking Active   hydrochlorothiazide (HYDRODIURIL) 12.5 MG tablet [id number] Yes Take 1 tablet (12.5 mg total) by mouth daily. In the am [name], [name], MD Taking Active   loratadine (CLARITIN) 10 MG tablet [id number] Yes Take 1 tablet (10 mg total) by mouth daily. [name], [name], MD Taking Active   losartan (COZAAR) 100 MG tablet [id number] Yes Take 1 tablet (100 mg total) by mouth daily. [name], [name], MD Taking Active   metFORMIN (GLUCOPHAGE XR) 500 MG 24 hr tablet  [id number] Yes Take 2 tablets (1,000 mg total) by mouth 2 (two) times daily with a meal. [name], [name], MD Taking Active   Multiple Vitamin (MULTIVITAMIN PO) [id number] Yes Take by mouth. [provider] Taking Active   Omega-3 Fatty Acids (FISH OIL) 1200 MG CPDR [id number] Yes Take 1 capsule by mouth. [provider] Taking Active   OneTouch Delica Lancets 33G MISC [id number] Yes 1 Stick by Does not  apply route daily. Tid [name], [name], MD Taking Active   Med List Note Warden Fillers, [place] 07/11/18 1829): [facility]            Patient Active Problem List   Diagnosis Date Noted   Hypertension associated with diabetes (HCC) 07/16/2019   Elevated liver enzymes 08/04/2018   Annual physical exam 04/05/2018   Hyperlipidemia 04/05/2018   OSA (obstructive sleep apnea) 03/07/2018   HTN (hypertension) 01/25/2018   Seasonal allergies 05/20/2017   Vitamin D deficiency 05/20/2017   DM (diabetes mellitus), type 2 (HCC) 05/20/2017   Morbid obesity with BMI of 50.0-59.9, adult (HCC) 05/20/2017   Acne keloidalis nuchae 05/20/2017    Conditions to be addressed/monitored: HTN, HLD, and DMII  Care Plan : Medication Management  Updates made by Lourena Simmonds, RPH-CPP since 10/15/2020 12:00 AM     Problem: Diabetes, HTN, HLD      Goal: Disease Progression Prevention   Start Date: 06/06/2020  This Visit's Progress: On track  Recent Progress: On track  Priority: High  Note:   Current Barriers:  Unable to achieve control of diabetes  High deductible health plan  Pharmacist Clinical Goal(s):  Over the next 90 days, patient will achieve control of diabetes as evidenced by A1c  through collaboration with PharmD and provider.   Interventions: 1:1 collaboration with [name], [name], MD regarding development and update of comprehensive plan of care as evidenced by provider attestation and co-signature Inter-disciplinary care team collaboration (see longitudinal plan of care) Comprehensive medication review performed; medication list updated in electronic medical record  Diabetes: Uncontrolled but improving per blood glucose readings; current treatment: metformin XR 1000 mg BID High deductible plan makes GLP1/SGLT2 difficult to afford Current glucose readings: fasting glucose: 80-130; up to 200s after a meal but these were rare, generally <180 after meals Current meal  patterns: breakfast: skips; lunch: vegetables, fish; dinner: chicken burrito bowl; drinks: water with flavoring packets; gatorade zero after exercise Current exercise: works with trainer at gym; minimum 3 days per week, 2 miles on elliptical, tracks steps daily - aiming for 10,000 steps daily.  Praised for continued medication adherence. Discussed potential for [facility], as there is the manufacturer program available for commercially insured patients for a $25 copay if the medication is not covered. Patient elects to continue diet and exercise at this time and see next lab work prior to consideration of adding medication.  Reviewed goal A1c, goal fasting glucose and goal 2 hour post prandial glucose  Hypertension: Controlled per last office visit; current treatment: losartan 100 mg daily, HCTZ 12.5 mg daily Home BP readings: has not been checking recently.  Recommended to continue current regimen at this time. Discussed benefit of periodic home BP readings   Hyperlipidemia: Uncontrolled, but anticipated to be improved; current treatment: atorvastatin 20 mg daily- moving to morning administration significant improved adherence Recommended to continue current regimen at this time. Lab work with next PCP appointment  Allergies: Controlled per patient report; fluticasone nasal spray, loratadine 10 mg daily Previously recommended to continue current regimen  at this time  Supplements: Multivitamin  Patient Goals/Self-Care Activities Over the next 90 days, patient will:  - take medications as prescribed check glucose twice daily, document, and provide at future appointments collaborate with provider on medication access solutions target a minimum of 150 minutes of moderate intensity exercise weekly engage in dietary modifications by reducing carbohydrate intake  Follow Up Plan: Follow up with PCP as scheduled. Patient has my contact information for future questions or concerns       Medication Assistance:  None required.  Patient affirms current coverage meets needs.  Follow Up:  Patient agrees to Care Plan and Follow-up.  Plan: Follow up with PCP as scheduled  Catie Feliz Beam, PharmD, [place], CPP Clinical Pharmacist [employer] at [facility] [phone]

## 2020-12-10 ENCOUNTER — Encounter: Payer: Self-pay | Admitting: Internal Medicine

## 2020-12-10 ENCOUNTER — Other Ambulatory Visit: Payer: Self-pay

## 2020-12-10 ENCOUNTER — Ambulatory Visit (INDEPENDENT_AMBULATORY_CARE_PROVIDER_SITE_OTHER): Payer: Managed Care, Other (non HMO) | Admitting: Internal Medicine

## 2020-12-10 VITALS — BP 124/88 | HR 83 | Temp 97.6°F | Ht 73.0 in | Wt >= 6400 oz

## 2020-12-10 DIAGNOSIS — E559 Vitamin D deficiency, unspecified: Secondary | ICD-10-CM

## 2020-12-10 DIAGNOSIS — I152 Hypertension secondary to endocrine disorders: Secondary | ICD-10-CM | POA: Diagnosis not present

## 2020-12-10 DIAGNOSIS — E1159 Type 2 diabetes mellitus with other circulatory complications: Secondary | ICD-10-CM | POA: Diagnosis not present

## 2020-12-10 DIAGNOSIS — Z1329 Encounter for screening for other suspected endocrine disorder: Secondary | ICD-10-CM

## 2020-12-10 DIAGNOSIS — Z0000 Encounter for general adult medical examination without abnormal findings: Secondary | ICD-10-CM | POA: Diagnosis not present

## 2020-12-10 LAB — COMPREHENSIVE METABOLIC PANEL
ALT: 47 U/L (ref 0–53)
AST: 30 U/L (ref 0–37)
Albumin: 4.6 g/dL (ref 3.5–5.2)
Alkaline Phosphatase: 57 U/L (ref 39–117)
BUN: 12 mg/dL (ref 6–23)
CO2: 28 mEq/L (ref 19–32)
Calcium: 9.5 mg/dL (ref 8.4–10.5)
Chloride: 96 mEq/L (ref 96–112)
Creatinine, Ser: 0.63 mg/dL (ref 0.40–1.50)
GFR: 127.4 mL/min (ref >60.00)
Glucose, Bld: 215 mg/dL — ABNORMAL HIGH (ref 70–99)
Potassium: 4.1 mEq/L (ref 3.5–5.1)
Sodium: 136 mEq/L (ref 135–145)
Total Bilirubin: 0.7 mg/dL (ref 0.2–1.2)
Total Protein: 7 g/dL (ref 6.0–8.3)

## 2020-12-10 LAB — TSH: TSH: 2.14 u[IU]/mL (ref 0.35–5.50)

## 2020-12-10 LAB — CBC WITH DIFFERENTIAL/PLATELET
Basophils Absolute: 0 10*3/uL (ref 0.0–0.1)
Basophils Relative: 0.7 % (ref 0.0–3.0)
Eosinophils Absolute: 0.3 10*3/uL (ref 0.0–0.7)
Eosinophils Relative: 6.6 % — ABNORMAL HIGH (ref 0.0–5.0)
HCT: 42.2 % (ref 39.0–52.0)
Hemoglobin: 14.1 g/dL (ref 13.0–17.0)
Lymphocytes Relative: 44.7 % (ref 12.0–46.0)
Lymphs Abs: 2.2 10*3/uL (ref 0.7–4.0)
MCHC: 33.4 g/dL (ref 30.0–36.0)
MCV: 85.5 fl (ref 78.0–100.0)
Monocytes Absolute: 0.4 10*3/uL (ref 0.1–1.0)
Monocytes Relative: 7.9 % (ref 3.0–12.0)
Neutro Abs: 1.9 10*3/uL (ref 1.4–7.7)
Neutrophils Relative %: 40.1 % — ABNORMAL LOW (ref 43.0–77.0)
Platelets: 269 10*3/uL (ref 150.0–400.0)
RBC: 4.94 Mil/uL (ref 4.22–5.81)
RDW: 13 % (ref 11.5–15.5)
WBC: 4.8 10*3/uL (ref 4.0–10.5)

## 2020-12-10 LAB — VITAMIN D 25 HYDROXY (VIT D DEFICIENCY, FRACTURES): VITD: 21.79 ng/mL — ABNORMAL LOW (ref 30.00–100.00)

## 2020-12-10 LAB — LIPID PANEL
Cholesterol: 146 mg/dL (ref 0–200)
HDL: 34.9 mg/dL — ABNORMAL LOW (ref >39.00)
Total CHOL/HDL Ratio: 4
Triglycerides: 547 mg/dL — ABNORMAL HIGH (ref 0.0–149.0)

## 2020-12-10 LAB — LDL CHOLESTEROL, DIRECT: Direct LDL: 39 mg/dL

## 2020-12-10 LAB — HEMOGLOBIN A1C: Hgb A1c MFr Bld: 10.7 % — ABNORMAL HIGH (ref 4.6–6.5)

## 2020-12-10 MED ORDER — TIRZEPATIDE 5 MG/0.5ML ~~LOC~~ SOAJ: 5.0000 mg | SUBCUTANEOUS | 0 refills | Status: DC

## 2020-12-10 MED ORDER — TIRZEPATIDE 2.5 MG/0.5ML ~~LOC~~ SOAJ: 2.5000 mg | SUBCUTANEOUS | 0 refills | Status: DC

## 2020-12-10 MED ORDER — MOUNJARO 7.5 MG/0.5ML ~~LOC~~ SOAJ: 7.5000 mg | SUBCUTANEOUS | 0 refills | Status: DC

## 2020-12-10 MED ORDER — MOUNJARO 15 MG/0.5ML ~~LOC~~ SOAJ: 15.0000 mg | SUBCUTANEOUS | 0 refills | Status: DC

## 2020-12-10 MED ORDER — MOUNJARO 10 MG/0.5ML ~~LOC~~ SOAJ: 10.0000 mg | SUBCUTANEOUS | 0 refills | Status: DC

## 2020-12-10 MED ORDER — MOUNJARO 12.5 MG/0.5ML ~~LOC~~ SOAJ: 12.5000 mg | SUBCUTANEOUS | 0 refills | Status: DC

## 2020-12-10 NOTE — Patient Instructions (Addendum)
Mounjaro savings card $25 x 2 years bypass insurance  Type In [place] coupon [email or web address]   Tirzepatide Injection What is this medication? TIRZEPATIDE (tir ZEP a tide) treats type 2 diabetes. It works by increasing insulin levels in your body, which decreases your blood sugar (glucose). Changes to diet and exercise are often combined with this medication. This medicine may be used for other purposes; ask your health care provider or pharmacist if you have questions. COMMON BRAND NAME(S): MOUNJARO What should I tell my care team before I take this medication? They need to know if you have any of these conditions: Endocrine tumors (MEN 2) or if someone in your family had these tumors Eye disease, vision problems Gallbladder disease History of pancreatitis Kidney disease Stomach or intestine problems Thyroid cancer or if someone in your family had thyroid cancer An unusual or allergic reaction to tirzepatide, other medications, foods, dyes, or preservatives Pregnant or trying to get pregnant Breast-feeding How should I use this medication? This medication is injected under the skin. You will be taught how to prepare and give it. It is given once every week (every 7 days). Keep taking it unless your health care provider tells you to stop. If you use this medication with insulin, you should inject this medication and the insulin separately. Do not mix them together. Do not give the injections right next to each other. Change (rotate) injection sites with each injection. This medication comes with INSTRUCTIONS FOR USE. Ask your pharmacist for directions on how to use this medication. Read the information carefully. Talk to your pharmacist or care team if you have questions. It is important that you put your used needles and syringes in a special sharps container. Do not put them in a trash can. If you do not have a sharps container, call your pharmacist or care team to get one. A special  MedGuide will be given to you by the pharmacist with each prescription and refill. Be sure to read this information carefully each time. Talk to your care team about the use of this medication in children. Special care may be needed. Overdosage: If you think you have taken too much of this medicine contact a poison control center or emergency room at once. NOTE: This medicine is only for you. Do not share this medicine with others. What if I miss a dose? If you miss a dose, take it as soon as you can unless it is more than 4 days (96 hours) late. If it is more than 4 days late, skip the missed dose. Take the next dose at the normal time. Do not take 2 doses within 3 days of each other. What may interact with this medication? Alcohol containing beverages Antiviral medications for HIV or AIDS Aspirin and aspirin-like medications Beta-blockers like atenolol, metoprolol, propranolol Certain medications for blood pressure, heart disease, irregular heart beat Chromium Clonidine Diuretics Male hormones, such as estrogens or progestins, birth control pills Fenofibrate Gemfibrozil Guanethidine Isoniazid Lanreotide Male hormones or anabolic steroids MAOIs like Carbex, Eldepryl, Marplan, Nardil, and Parnate Medications for weight loss Medications for allergies, asthma, cold, or cough Medications for depression, anxiety, or psychotic disturbances Niacin Nicotine NSAIDs, medications for pain and inflammation, like ibuprofen or naproxen Octreotide Other medications for diabetes, like glyburide, glipizide, or glimepiride Pasireotide Pentamidine Phenytoin Probenecid Quinolone antibiotics such as ciprofloxacin, levofloxacin, ofloxacin Reserpine Some herbal dietary supplements Steroid medications such as prednisone or cortisone Sulfamethoxazole; trimethoprim Thyroid hormones Warfarin This list may not describe all possible interactions. Give  your health care provider a list of all the  medicines, herbs, non-prescription drugs, or dietary supplements you use. Also tell them if you smoke, drink alcohol, or use illegal drugs. Some items may interact with your medicine. What should I watch for while using this medication? Visit your care team for regular checks on your progress. Drink plenty of fluids while taking this medication. Check with your care team if you get an attack of severe diarrhea, nausea, and vomiting. The loss of too much body fluid can make it dangerous for you to take this medication. A test called the HbA1C (A1C) will be monitored. This is a simple blood test. It measures your blood sugar control over the last 2 to 3 months. You will receive this test every 3 to 6 months. Learn how to check your blood sugar. Learn the symptoms of low and high blood sugar and how to manage them. Always carry a quick-source of sugar with you in case you have symptoms of low blood sugar. Examples include hard sugar candy or glucose tablets. Make sure others know that you can choke if you eat or drink when you develop serious symptoms of low blood sugar, such as seizures or unconsciousness. They must get medical help at once. Tell your care team if you have high blood sugar. You might need to change the dose of your medication. If you are sick or exercising more than usual, you might need to change the dose of your medication. Do not skip meals. Ask your care team if you should avoid alcohol. Many nonprescription cough and cold products contain sugar or alcohol. These can affect blood sugar. Pens should never be shared. Even if the needle is changed, sharing may result in passing of viruses like hepatitis or HIV. Wear a medical ID bracelet or chain, and carry a card that describes your disease and details of your medication and dosage times. Birth control may not work properly while you are taking this medication. If you take birth control pills by mouth, your care team may recommend another  type of birth control for 4 weeks after you start this medication and for 4 weeks after each increase in your dose of this medication. Ask your care team which birth control methods you should use. What side effects may I notice from receiving this medication? Side effects that you should report to your care team as soon as possible: Allergic reactions-skin rash, itching, hives, swelling of the face, lips, tongue, or throat Change in vision Dehydration-increased thirst, dry mouth, feeling faint or lightheaded, headache, dark yellow or brown urine Gallbladder problems-severe stomach pain, nausea, vomiting, fever Kidney injury-decrease in the amount of urine, swelling of the ankles, hands, or feet Pancreatitis-severe stomach pain that spreads to your back or gets worse after eating or when touched, fever, nausea, vomiting Thyroid cancer-new mass or lump in the neck, pain or trouble swallowing, trouble breathing, hoarseness Side effects that usually do not require medical attention (report these to your care team if they continue or are bothersome): Constipation Diarrhea Loss of Appetite Nausea Stomach pain Upset stomach Vomiting This list may not describe all possible side effects. Call your doctor for medical advice about side effects. You may report side effects to FDA at 1-800-FDA-1088. Where should I keep my medication? Keep out of the reach of children and pets. Refrigeration (preferred): Store unopened pens in a refrigerator between 2 and 8 degrees C (36 and 46 degrees F). Keep it in the original carton until  you are ready to take it. Do not freeze or use if the medication has been frozen. Protect from light. Get rid of any unused medication after the expiration date on the label. Room Temperature: The pen may be stored at room temperature below 30 degrees C (86 degrees F) for up to a total of 21 days if needed. Protect from light. Avoid exposure to extreme heat. If it is stored at room  temperature, throw away any unused medication after 21 days or after it expires, whichever is first. The pen has glass parts. Handle it carefully. If you drop the pen on a hard surface, do not use it. Use a new pen for your injection. To get rid of medications that are no longer needed or have expired: Take the medication to a medication take-back program. Check with your pharmacy or law enforcement to find a location. If you cannot return the medication, ask your pharmacist or care team how to get rid of this medication safely. NOTE: This sheet is a summary. It may not cover all possible information. If you have questions about this medicine, talk to your doctor, pharmacist, or health care provider.  2022 Elsevier/Gold Standard (2020-06-24 13:57:48)

## 2020-12-10 NOTE — Progress Notes (Signed)
Chief Complaint  Patient presents with   Follow-up   Annual  1. Htn sl elevatd and dm 2 last a1c 8.9 on lipitor 20 mg hctz 12.5 losartan 100 mg qd metformin xr 500 mg bid  Fasting for labs today  Morbid obesity will try mounjaro sample 2.5 weekly today and continue titration if able to tolerate   Review of Systems  Constitutional:  Negative for weight loss.  HENT:  Negative for hearing loss.   Eyes:  Negative for blurred vision.  Respiratory:  Negative for shortness of breath.   Cardiovascular:  Negative for chest pain.  Gastrointestinal:  Negative for abdominal pain.  Musculoskeletal:  Negative for falls and joint pain.  Skin:  Negative for rash.  Neurological:  Negative for headaches.  Psychiatric/Behavioral:  Negative for depression.   Past Medical History:  Diagnosis Date   Asthma    child   Diabetes mellitus without complication ([place])    Hyperlipidemia    Hypertension    Morbid obesity ([place])    Sinus tachycardia    Past Surgical History:  Procedure Laterality Date   right wrist fracture     Family History  Problem Relation Age of Onset   Hypertension Mother    Arthritis Mother    Asthma Mother    Depression Mother    Hyperlipidemia Mother    Diabetes Mother    Hypertension Father    Depression Father    Hyperlipidemia Father    Intellectual disability Father    Stroke Father    Hepatitis C Father    Kidney failure Father        s/p transplant    Heart disease Maternal Grandfather    Diabetes Paternal Grandmother    Drug abuse Paternal Grandmother    Diabetes Maternal Grandmother    Social History   Socioeconomic History   Marital status: Married    Spouse name: Not on file   Number of children: Not on file   Years of education: Not on file   Highest education level: Not on file  Occupational History   Not on file  Tobacco Use   Smoking status: Never   Smokeless tobacco: Never  Substance and Sexual Activity   Alcohol use: Yes    Alcohol/week:  1.0 standard drink    Types: 1 Cans of beer per week    Comment: occasional   Drug use: No   Sexual activity: Yes  Other Topics Concern   Not on file  Social History Narrative   Diploma, deputy detention officer    Married    2 kids 63 y.o boy, 77 month boy, wife pregnant as of 06/20/19    -as of 01/23/20 pt has 3 boys       Drinks occasionally    Never smoker, no chew    Owns guns, wears seat belt, safe in relationship       Social Determinants of Health   Financial Resource Strain: Medium Risk   Difficulty of Paying Living Expenses: Somewhat hard  Food Insecurity: Not on file  Transportation Needs: Not on file  Physical Activity: Not on file  Stress: Not on file  Social Connections: Not on file  Intimate Partner Violence: Not on file   Current Meds  Medication Sig   atorvastatin (LIPITOR) 20 MG tablet Take 1 tablet (20 mg total) by mouth daily.   CINNAMON PO Take by mouth.   fluticasone (FLONASE) 50 MCG/ACT nasal spray Place 1 spray into both nostrils daily as  needed for allergies or rhinitis.   glucose blood test strip Use as instructed check 3x per day   hydrochlorothiazide (HYDRODIURIL) 12.5 MG tablet Take 1 tablet (12.5 mg total) by mouth daily. In the am   loratadine (CLARITIN) 10 MG tablet Take 1 tablet (10 mg total) by mouth daily.   losartan (COZAAR) 100 MG tablet Take 1 tablet (100 mg total) by mouth daily.   metFORMIN (GLUCOPHAGE XR) 500 MG 24 hr tablet Take 2 tablets (1,000 mg total) by mouth 2 (two) times daily with a meal.   Multiple Vitamin (MULTIVITAMIN PO) Take by mouth.   Omega-3 Fatty Acids (FISH OIL) 1200 MG CPDR Take 1 capsule by mouth.   OneTouch Delica Lancets 47M MISC 1 Stick by Does not apply route daily. Tid   tirzepatide (MOUNJARO) 10 MG/0.5ML Pen Inject 10 mg into the skin once a week.   tirzepatide (MOUNJARO) 12.5 MG/0.5ML Pen Inject 12.5 mg into the skin once a week.   tirzepatide (MOUNJARO) 15 MG/0.5ML Pen Inject 15 mg into the skin once a  week.   tirzepatide [facility]) 2.5 MG/0.5ML Pen Inject 2.5 mg into the skin once a week.   tirzepatide [facility]) 5 MG/0.5ML Pen Inject 5 mg into the skin once a week. Month 2   tirzepatide (MOUNJARO) 7.5 MG/0.5ML Pen Inject 7.5 mg into the skin once a week.   No Known Allergies No results found for this or any previous visit (from the past 2160 hour(s)). Objective  Body mass index is 55.78 kg/m. Wt Readings from Last 3 Encounters:  12/10/20 (!) 422 lb 12.8 oz (191.8 kg)  09/17/20 (!) 423 lb (191.9 kg)  04/23/20 (!) 423 lb 3.2 oz (192 kg)   Temp Readings from Last 3 Encounters:  12/10/20 97.6 F (36.4 C) (Oral)  04/23/20 98 F (36.7 C) (Oral)  01/23/20 97.8 F (36.6 C) (Oral)   BP Readings from Last 3 Encounters:  12/10/20 124/88  04/23/20 (!) 140/92  01/23/20 126/86   Pulse Readings from Last 3 Encounters:  12/10/20 83  04/23/20 88  01/23/20 (!) 104    Physical Exam Vitals and nursing note reviewed.  Constitutional:      Appearance: Normal appearance. He is well-developed and well-groomed. He is morbidly obese.  HENT:     Head: Normocephalic and atraumatic.  Eyes:     Conjunctiva/sclera: Conjunctivae normal.     Pupils: Pupils are equal, round, and reactive to light.  Cardiovascular:     Rate and Rhythm: Normal rate and regular rhythm.     Heart sounds: Normal heart sounds. No murmur heard. Pulmonary:     Effort: Pulmonary effort is normal.     Breath sounds: Normal breath sounds.  Abdominal:     Tenderness: There is no abdominal tenderness.  Skin:    General: Skin is warm and dry.  Neurological:     General: No focal deficit present.     Mental Status: He is alert and oriented to person, place, and time. Mental status is at baseline.     Gait: Gait normal.  Psychiatric:        Attention and Perception: Attention and perception normal.        Mood and Affect: Mood and affect normal.        Speech: Speech normal.        Behavior: Behavior normal. Behavior  is cooperative.        Thought Content: Thought content normal.        Cognition and Memory:  Cognition and memory normal.        Judgment: Judgment normal.    Assessment  Plan  Annual physical exam Declines flu  Tdap utd 3/3 moderna   Consider pna 23 vx Had 2/2 hep B vaccines immune rec MMR in future not immune  rec healthy diet and exercise  Waist circumference is ~61 inches prior to 04/23/20 losing wt via about and trying    Reviewed derm note 05/16/17 given clindamycin 1% lotion and halobetasol topical foam 0.05%, tend skin for razor irritation neck  Dr. Nehemiah Massed  Rec healthy diet and exercise   Hypertension associated with diabetes ([place]) - Plan: Lipid panel, Comprehensive metabolic panel, Hemoglobin A1c, CBC with Differential/Platelet, tirzepatide (MOUNJARO) 2.5  -->sample given today weekly injection  Cont metformin bid 500 xr   MG/0.5ML Pen, tirzepatide (MOUNJARO) 5 MG/0.5ML Pen, tirzepatide (MOUNJARO) 7.5 MG/0.5ML Pen, tirzepatide (MOUNJARO) 10 MG/0.5ML Pen, tirzepatide (MOUNJARO) 12.5 MG/0.5ML Pen, tirzepatide (MOUNJARO) 15 MG/0.5ML Pen  Vitamin D deficiency - Plan: Vitamin D (25 hydroxy)   Morbid obesity (HCC) - Plan: tirzepatide (MOUNJARO) 2.5 MG/0.5ML Pen, tirzepatide (MOUNJARO) 5 MG/0.5ML Pen, tirzepatide (MOUNJARO) 7.5 MG/0.5ML Pen, tirzepatide (MOUNJARO) 10 MG/0.5ML Pen, tirzepatide (MOUNJARO) 12.5 MG/0.5ML Pen, tirzepatide Darcel Bayley) 15 MG/0.5ML Pen    Provider: Dr. Olivia Mackie [name]-Internal Medicine

## 2020-12-11 ENCOUNTER — Other Ambulatory Visit: Payer: Self-pay | Admitting: Internal Medicine

## 2020-12-11 DIAGNOSIS — E559 Vitamin D deficiency, unspecified: Secondary | ICD-10-CM

## 2020-12-11 MED ORDER — CHOLECALCIFEROL 1.25 MG (50000 UT) PO CAPS: 50000.0000 [IU] | ORAL_CAPSULE | ORAL | 1 refills | Status: DC

## 2020-12-15 ENCOUNTER — Telehealth: Payer: Self-pay | Admitting: Internal Medicine

## 2020-12-15 NOTE — Telephone Encounter (Signed)
Prior authorization has been submitted for patient's [facility]   Awaiting approval or denial.

## 2020-12-18 ENCOUNTER — Telehealth: Payer: Self-pay

## 2020-12-18 NOTE — Chronic Care Management (AMB) (Signed)
  Care Management   Note  12/18/2020 Name: TILDEN BROZ MRN: [id number] DOB: [birth year]  [name] is a 30 y.o. year old male who is a primary care patient of [name], [name], MD and is actively engaged with the care management team. I reached out to [name] by phone today to assist with re-scheduling a follow up visit with the Pharmacist  Follow up plan: Unsuccessful telephone outreach attempt made. A HIPAA compliant phone message was left for the patient providing contact information and requesting a return call.  The care management team will reach out to the patient again over the next 7 days.  If patient returns call to provider office, please advise to call Embedded Care Management Care Guide Penne Lash  at [phone]  Penne Lash, RMA Care Guide, Embedded Care Coordination [facility]  [place], [place] [address] Direct Dial: [phone] [email or web address]: [email or web address]

## 2020-12-22 ENCOUNTER — Telehealth: Payer: Self-pay

## 2020-12-22 NOTE — Chronic Care Management (AMB) (Signed)
  Care Management   Note  12/22/2020 Name: Rickey Payne MRN: [id number] DOB: 1990/11/21  Rickey Payne is a 30 y.o. year old male who is a primary care patient of [name], [name], MD and is actively engaged with the care management team. I reached out to Rickey Payne by phone today to assist with re-scheduling a follow up visit with the Pharmacist  Follow up plan: Unsuccessful telephone outreach attempt made. A HIPAA compliant phone message was left for the patient providing contact information and requesting a return call.  The care management team will reach out to the patient again over the next 3 days.  If patient returns call to provider office, please advise to call Embedded Care Management Care Guide Penne Lash  at [phone]  Penne Lash, RMA Care Guide, Embedded Care Coordination [facility]  [place], [place] [address] Direct Dial: [phone] [email or web address]: [email or web address]

## 2020-12-31 NOTE — Chronic Care Management (AMB) (Signed)
  Care Management   Note  12/31/2020 Name: DENNEY SHEIN MRN: [id number] DOB: Mar 26, 1990  [name] is a 30 y.o. year old male who is a primary care patient of [name], [name], MD and is actively engaged with the care management team. I reached out to [name] by phone today to assist with re-scheduling a follow up visit with the Pharmacist  Follow up plan: Unsuccessful telephone outreach attempt made. A HIPAA compliant phone message was left for the patient providing contact information and requesting a return call.  The care management team will reach out to the patient again over the next 5 days.  If patient returns call to provider office, please advise to call Embedded Care Management Care Guide Penne Lash  at [phone]  Penne Lash, RMA Care Guide, Embedded Care Coordination [facility]  [place], [place] [address] Direct Dial: [phone] [email or web address] Website: [email or web address]

## 2021-01-05 ENCOUNTER — Other Ambulatory Visit: Payer: Self-pay | Admitting: Internal Medicine

## 2021-01-05 DIAGNOSIS — I152 Hypertension secondary to endocrine disorders: Secondary | ICD-10-CM

## 2021-01-12 ENCOUNTER — Telehealth: Payer: Self-pay

## 2021-01-12 NOTE — Chronic Care Management (AMB) (Signed)
  Care Management   Note  01/12/2021 Name: Rickey Payne MRN: [id number] DOB: [birth year]  Rickey Payne is a 30 y.o. year old male who is a primary care patient of [name], [name], MD and is actively engaged with the care management team. I reached out to Rickey Payne by phone today to assist with re-scheduling a follow up visit with the Pharmacist  Follow up plan: Unable to make contact on outreach attempts x 3. PCP [name], [name], MD notified via routed documentation in medical record.   Penne Lash, RMA Care Guide, Embedded Care Coordination [facility]  [place], [place] [address] Direct Dial: [phone] [email or web address]: [email or web address]

## 2021-01-12 NOTE — Chronic Care Management (AMB) (Signed)
  Care Management   Note  01/12/2021 Name: Rickey Payne MRN: [id number] DOB: [birth year]  Rickey Payne is a 30 y.o. year old male who is a primary care patient of [name], [name], MD and is actively engaged with the care management team. I reached out to Rickey Payne by phone today to assist with re-scheduling a follow up visit with the Pharmacist  Follow up plan: Unsuccessful telephone outreach attempt made. A HIPAA compliant phone message was left for the patient providing contact information and requesting a return call.  The care management team will reach out to the patient again over the next 5 days.  If patient returns call to provider office, please advise to call Embedded Care Management Care Guide Penne Lash  at [phone]  Penne Lash, RMA Care Guide, Embedded Care Coordination [facility]  [place], [place] [address] Direct Dial: [phone] [email or web address] Website: [email or web address]

## 2021-01-15 NOTE — Chronic Care Management (AMB) (Signed)
  Care Management   Note  01/15/2021 Name: Rickey Payne MRN: [id number] DOB: Mar 23, 1990  Rickey Payne is a 30 y.o. year old male who is a primary care patient of [name], [name], MD and is actively engaged with the care management team. I reached out to Rickey Payne by phone today to assist with re-scheduling a follow up visit with the Pharmacist  Follow up plan: Unsuccessful telephone outreach attempt made. A HIPAA compliant phone message was left for the patient providing contact information and requesting a return call.  The care management team will reach out to the patient again over the next 7 days.  If patient returns call to provider office, please advise to call Embedded Care Management Care Guide Penne Lash  at [phone]  Penne Lash, RMA Care Guide, Embedded Care Coordination [facility]  [place], [place] [address] Direct Dial: [phone] [email or web address] Website: [email or web address]

## 2021-01-16 ENCOUNTER — Telehealth: Payer: Managed Care, Other (non HMO)

## 2021-01-29 NOTE — Chronic Care Management (AMB) (Signed)
°  Care Management   Note  01/29/2021 Name: [name] MRN: [id number] DOB: 1990/08/01  Rickey Payne is a 30 y.o. year old male who is a primary care patient of [name], [name], MD and is actively engaged with the care management team. I reached out to Rickey Payne by phone today to assist with re-scheduling a follow up visit with the Pharmacist  Follow up plan: Unable to make contact on outreach attempts x 3. PCP [name], [name], MD notified via routed documentation in medical record.   Penne Lash, RMA Care Guide, Embedded Care Coordination [facility]  [place], [place] [address] Direct Dial: [phone] [email or web address] Website: [email or web address]

## 2021-02-13 ENCOUNTER — Telehealth: Payer: Self-pay | Admitting: Internal Medicine

## 2021-02-13 ENCOUNTER — Other Ambulatory Visit: Payer: Self-pay | Admitting: Internal Medicine

## 2021-02-13 DIAGNOSIS — R110 Nausea: Secondary | ICD-10-CM

## 2021-02-13 MED ORDER — ONDANSETRON HCL 4 MG PO TABS: 4.0000 mg | ORAL_TABLET | Freq: Two times a day (BID) | ORAL | 5 refills | Status: DC | PRN

## 2021-02-13 NOTE — Telephone Encounter (Signed)
Sent zofran  This can be side effect if not tolerating rec reduce dose   Let Rickey Payne know

## 2021-02-13 NOTE — Telephone Encounter (Signed)
Added to telephone encounter and routed to the CMA/LPN for triage   

## 2021-02-13 NOTE — Telephone Encounter (Signed)
Rickey Payne  You 2 hours ago (10:34 AM)   MM Thank you. I have a few questions about the monjaro. The 2.5mg  made me feel a little different. Since my dosage went up to 7.5mg  Ive been feeling very nauseous, and tho it has curved my appetite. I havent been able to keep anything down the past 2 to 3 days. Should i be concerned? Or are these side affects normal when taking this shot?   Please advise

## 2021-02-13 NOTE — Telephone Encounter (Signed)
Sent mychart message to discuss reducing dose back to 5 mg weekly

## 2021-03-27 ENCOUNTER — Other Ambulatory Visit: Payer: Self-pay | Admitting: Internal Medicine

## 2021-03-27 DIAGNOSIS — I152 Hypertension secondary to endocrine disorders: Secondary | ICD-10-CM

## 2021-03-27 DIAGNOSIS — E1159 Type 2 diabetes mellitus with other circulatory complications: Secondary | ICD-10-CM

## 2021-04-14 ENCOUNTER — Ambulatory Visit: Payer: Self-pay | Admitting: Pharmacist

## 2021-04-14 NOTE — Chronic Care Management (AMB) (Signed)
?  Chronic Care Management  ? ?Note ? ?04/14/2021 ?Name: Rickey Payne MRN: [id number] DOB: Jan 12, 1991 ? ? ? ?Closing pharmacy CCM case at this time.  Patient has clinic contact information for future questions or concerns.  ? ?Catie Feliz Beam, PharmD, [place], CPP ?Clinical Pharmacist ?Nature conservation officer at [employer] ?[phone] ? ?

## 2021-04-18 ENCOUNTER — Other Ambulatory Visit: Payer: Self-pay | Admitting: Internal Medicine

## 2021-04-18 DIAGNOSIS — E1159 Type 2 diabetes mellitus with other circulatory complications: Secondary | ICD-10-CM

## 2021-04-20 MED ORDER — MOUNJARO 10 MG/0.5ML ~~LOC~~ SOAJ: 10.0000 mg | SUBCUTANEOUS | 0 refills | Status: DC

## 2021-04-23 ENCOUNTER — Other Ambulatory Visit: Payer: Self-pay | Admitting: Internal Medicine

## 2021-05-12 ENCOUNTER — Other Ambulatory Visit: Payer: Self-pay | Admitting: Internal Medicine

## 2021-05-12 DIAGNOSIS — I10 Essential (primary) hypertension: Secondary | ICD-10-CM

## 2021-06-04 ENCOUNTER — Encounter: Payer: Self-pay | Admitting: Internal Medicine

## 2021-06-04 ENCOUNTER — Telehealth: Payer: Self-pay

## 2021-06-04 DIAGNOSIS — Z6841 Body Mass Index (BMI) 40.0 and over, adult: Secondary | ICD-10-CM | POA: Insufficient documentation

## 2021-06-04 NOTE — Telephone Encounter (Signed)
[employer] insurance to initiate PA for pt's tirzepatide [facility]) 10 MG/0.5ML Pen . Insurance company will be faxing form over for pcp to complete and fax over to them using Fax #[phone] ?

## 2021-06-10 NOTE — Telephone Encounter (Signed)
Patient used last shot of Mounjaro today. He is wanted to know if the Prior Auth went through.  ?

## 2021-06-10 NOTE — Telephone Encounter (Signed)
Lvm to inform pt that I called his insurance company in regards to his PA on [place]. Insurance company stated that Rickey Payne are having high call volumes. I informed them that Fax was originally sent on 06/04/21 and they said turnaround time was 8-10 business days and they will call to see where process of PA is at.  ?

## 2021-06-11 ENCOUNTER — Other Ambulatory Visit: Payer: Self-pay | Admitting: Internal Medicine

## 2021-06-11 DIAGNOSIS — I152 Hypertension secondary to endocrine disorders: Secondary | ICD-10-CM

## 2021-06-11 MED ORDER — MOUNJARO 15 MG/0.5ML ~~LOC~~ SOAJ: 15.0000 mg | SUBCUTANEOUS | 2 refills | Status: DC

## 2021-06-11 MED ORDER — MOUNJARO 7.5 MG/0.5ML ~~LOC~~ SOAJ: SUBCUTANEOUS | 1 refills | Status: DC

## 2021-06-11 MED ORDER — MOUNJARO 12.5 MG/0.5ML ~~LOC~~ SOAJ: 12.5000 mg | SUBCUTANEOUS | 1 refills | Status: DC

## 2021-06-11 NOTE — Telephone Encounter (Signed)
S/w pt and informed him that the reason why the doses keep increasing is because if he is able to tolerate dose he should increase them so it can it help manage his WT and his sugars. Pt verbalized understanding to information that was given to hi,. Informed him I would do my best to start on PA for his other doses so he won't be off of the medication for a long time waiting for it to get approved. Pt agreed.  ?

## 2021-06-11 NOTE — Telephone Encounter (Signed)
Patient called, note was read. Patient would like to know why the dosage keeps increasing? Please call him. ?

## 2021-07-02 ENCOUNTER — Other Ambulatory Visit: Payer: Self-pay | Admitting: Internal Medicine

## 2021-07-02 DIAGNOSIS — I10 Essential (primary) hypertension: Secondary | ICD-10-CM

## 2021-07-08 ENCOUNTER — Other Ambulatory Visit: Payer: Self-pay | Admitting: Internal Medicine

## 2021-07-08 DIAGNOSIS — I152 Hypertension secondary to endocrine disorders: Secondary | ICD-10-CM

## 2021-07-09 NOTE — Telephone Encounter (Signed)
I tried to call patient to schedule follow-up. Was sent to VM.

## 2021-07-23 ENCOUNTER — Other Ambulatory Visit: Payer: Self-pay | Admitting: Internal Medicine

## 2021-07-23 DIAGNOSIS — E1159 Type 2 diabetes mellitus with other circulatory complications: Secondary | ICD-10-CM

## 2021-07-24 NOTE — Telephone Encounter (Signed)
LMTCB to see if patient wanted [place] to try to send elsewhere based on comment.

## 2021-07-28 ENCOUNTER — Other Ambulatory Visit: Payer: Self-pay

## 2021-07-28 DIAGNOSIS — E1159 Type 2 diabetes mellitus with other circulatory complications: Secondary | ICD-10-CM

## 2021-07-28 MED ORDER — MOUNJARO 10 MG/0.5ML ~~LOC~~ SOAJ: 10.0000 mg | SUBCUTANEOUS | 0 refills | Status: DC

## 2021-07-28 NOTE — Telephone Encounter (Signed)
Pt returning call. Pt requesting callback.  °

## 2021-07-28 NOTE — Telephone Encounter (Signed)
LMTCB

## 2021-07-28 NOTE — Telephone Encounter (Signed)
Pt requested that script be sent to [facility] in [place] for him to see if he could get there. He will let us know as may also be on back order.

## 2021-09-13 ENCOUNTER — Other Ambulatory Visit: Payer: Self-pay | Admitting: Internal Medicine

## 2021-09-13 DIAGNOSIS — E1159 Type 2 diabetes mellitus with other circulatory complications: Secondary | ICD-10-CM

## 2021-10-05 ENCOUNTER — Other Ambulatory Visit: Payer: Self-pay | Admitting: Internal Medicine

## 2021-10-05 DIAGNOSIS — I152 Hypertension secondary to endocrine disorders: Secondary | ICD-10-CM

## 2021-10-05 DIAGNOSIS — E785 Hyperlipidemia, unspecified: Secondary | ICD-10-CM

## 2021-10-05 DIAGNOSIS — I10 Essential (primary) hypertension: Secondary | ICD-10-CM

## 2021-10-05 MED ORDER — LOSARTAN POTASSIUM 100 MG PO TABS: ORAL_TABLET | ORAL | 3 refills | Status: DC

## 2021-10-05 MED ORDER — ATORVASTATIN CALCIUM 20 MG PO TABS: 20.0000 mg | ORAL_TABLET | Freq: Every day | ORAL | 3 refills | Status: DC

## 2021-10-05 MED ORDER — ONETOUCH ULTRA VI STRP: ORAL_STRIP | 3 refills | Status: DC

## 2021-10-05 MED ORDER — METFORMIN HCL ER 500 MG PO TB24: ORAL_TABLET | ORAL | 3 refills | Status: DC

## 2021-10-05 MED ORDER — ONETOUCH DELICA PLUS LANCET33G MISC: 12 refills | Status: AC

## 2021-10-05 MED ORDER — HYDROCHLOROTHIAZIDE 12.5 MG PO TABS: ORAL_TABLET | ORAL | 3 refills | Status: DC

## 2021-10-07 ENCOUNTER — Other Ambulatory Visit: Payer: Self-pay

## 2021-10-13 ENCOUNTER — Other Ambulatory Visit: Payer: Self-pay | Admitting: Family Medicine

## 2021-10-13 DIAGNOSIS — E785 Hyperlipidemia, unspecified: Secondary | ICD-10-CM

## 2021-10-23 ENCOUNTER — Encounter: Payer: Self-pay | Admitting: Internal Medicine

## 2021-10-23 ENCOUNTER — Ambulatory Visit (INDEPENDENT_AMBULATORY_CARE_PROVIDER_SITE_OTHER): Payer: Managed Care, Other (non HMO) | Admitting: Internal Medicine

## 2021-10-23 ENCOUNTER — Other Ambulatory Visit: Payer: Self-pay

## 2021-10-23 ENCOUNTER — Telehealth: Payer: Self-pay

## 2021-10-23 VITALS — BP 130/80 | HR 74 | Temp 97.9°F | Ht 73.0 in | Wt >= 6400 oz

## 2021-10-23 DIAGNOSIS — I10 Essential (primary) hypertension: Secondary | ICD-10-CM

## 2021-10-23 DIAGNOSIS — T148XXA Other injury of unspecified body region, initial encounter: Secondary | ICD-10-CM

## 2021-10-23 DIAGNOSIS — E559 Vitamin D deficiency, unspecified: Secondary | ICD-10-CM

## 2021-10-23 DIAGNOSIS — E1159 Type 2 diabetes mellitus with other circulatory complications: Secondary | ICD-10-CM | POA: Diagnosis not present

## 2021-10-23 DIAGNOSIS — Z1389 Encounter for screening for other disorder: Secondary | ICD-10-CM | POA: Diagnosis not present

## 2021-10-23 DIAGNOSIS — I152 Hypertension secondary to endocrine disorders: Secondary | ICD-10-CM

## 2021-10-23 DIAGNOSIS — Z6841 Body Mass Index (BMI) 40.0 and over, adult: Secondary | ICD-10-CM | POA: Diagnosis not present

## 2021-10-23 DIAGNOSIS — L923 Foreign body granuloma of the skin and subcutaneous tissue: Secondary | ICD-10-CM

## 2021-10-23 DIAGNOSIS — R110 Nausea: Secondary | ICD-10-CM

## 2021-10-23 DIAGNOSIS — E785 Hyperlipidemia, unspecified: Secondary | ICD-10-CM

## 2021-10-23 LAB — COMPREHENSIVE METABOLIC PANEL
ALT: 36 U/L (ref 0–53)
AST: 22 U/L (ref 0–37)
Albumin: 4.2 g/dL (ref 3.5–5.2)
Alkaline Phosphatase: 70 U/L (ref 39–117)
BUN: 13 mg/dL (ref 6–23)
CO2: 30 mEq/L (ref 19–32)
Calcium: 9.4 mg/dL (ref 8.4–10.5)
Chloride: 96 mEq/L (ref 96–112)
Creatinine, Ser: 0.69 mg/dL (ref 0.40–1.50)
GFR: 123.19 mL/min (ref >60.00)
Glucose, Bld: 222 mg/dL — ABNORMAL HIGH (ref 70–99)
Potassium: 4.7 mEq/L (ref 3.5–5.1)
Sodium: 136 mEq/L (ref 135–145)
Total Bilirubin: 0.7 mg/dL (ref 0.2–1.2)
Total Protein: 6.9 g/dL (ref 6.0–8.3)

## 2021-10-23 LAB — LIPID PANEL
Cholesterol: 134 mg/dL (ref 0–200)
HDL: 36.8 mg/dL — ABNORMAL LOW (ref >39.00)
NonHDL: 97.36
Total CHOL/HDL Ratio: 4
Triglycerides: 237 mg/dL — ABNORMAL HIGH (ref 0.0–149.0)
VLDL: 47.4 mg/dL — ABNORMAL HIGH (ref 0.0–40.0)

## 2021-10-23 LAB — HEMOGLOBIN A1C: Hgb A1c MFr Bld: 11 % — ABNORMAL HIGH (ref 4.6–6.5)

## 2021-10-23 LAB — VITAMIN D 25 HYDROXY (VIT D DEFICIENCY, FRACTURES): VITD: 23.67 ng/mL — ABNORMAL LOW (ref 30.00–100.00)

## 2021-10-23 LAB — LDL CHOLESTEROL, DIRECT: Direct LDL: 78 mg/dL

## 2021-10-23 MED ORDER — HYDROCHLOROTHIAZIDE 12.5 MG PO TABS: ORAL_TABLET | ORAL | 3 refills | Status: DC

## 2021-10-23 MED ORDER — MOUNJARO 5 MG/0.5ML ~~LOC~~ SOAJ
5.0000 mg | SUBCUTANEOUS | 0 refills | Status: DC
  Filled 2021-10-23: qty 2, 28d supply, fill #0

## 2021-10-23 MED ORDER — MUPIROCIN 2 % EX OINT: 1.0000 | TOPICAL_OINTMENT | Freq: Two times a day (BID) | CUTANEOUS | 0 refills | Status: DC

## 2021-10-23 MED ORDER — ONDANSETRON HCL 4 MG PO TABS: 4.0000 mg | ORAL_TABLET | Freq: Two times a day (BID) | ORAL | 1 refills | Status: DC | PRN

## 2021-10-23 MED ORDER — MOUNJARO 10 MG/0.5ML ~~LOC~~ SOAJ
10.0000 mg | SUBCUTANEOUS | 5 refills | Status: DC
  Filled 2021-10-23: qty 3, 42d supply, fill #0
  Filled 2021-10-23 – 2021-11-23 (×2): qty 2, 28d supply, fill #0

## 2021-10-23 MED ORDER — MOUNJARO 12.5 MG/0.5ML ~~LOC~~ SOAJ
12.5000 mg | SUBCUTANEOUS | 5 refills | Status: DC
  Filled 2021-10-23: qty 3, 42d supply, fill #0
  Filled 2021-10-23: qty 2, 28d supply, fill #0

## 2021-10-23 MED ORDER — MOUNJARO 7.5 MG/0.5ML ~~LOC~~ SOAJ
7.5000 mg | SUBCUTANEOUS | 0 refills | Status: DC
  Filled 2021-10-23: qty 2, 28d supply, fill #0
  Filled 2021-10-23: qty 3, 42d supply, fill #0
  Filled 2021-10-30: qty 2, 28d supply, fill #0

## 2021-10-23 MED ORDER — ATORVASTATIN CALCIUM 20 MG PO TABS: ORAL_TABLET | ORAL | 3 refills | Status: DC

## 2021-10-23 MED ORDER — METFORMIN HCL ER 500 MG PO TB24: ORAL_TABLET | ORAL | 3 refills | Status: DC

## 2021-10-23 MED ORDER — CHOLECALCIFEROL 1.25 MG (50000 UT) PO CAPS: 50000.0000 [IU] | ORAL_CAPSULE | ORAL | 1 refills | Status: DC

## 2021-10-23 MED ORDER — LOSARTAN POTASSIUM 100 MG PO TABS: ORAL_TABLET | ORAL | 3 refills | Status: DC

## 2021-10-23 NOTE — Telephone Encounter (Signed)
Medication Samples have been provided to the patient.  Drug name: Greggory Keen       Strength: 2.5 mg/0.54mL        Qty: 1 box  LOT: [id number]  Exp.Date: 03-13-2023  Dosing instructions: inject 2.5 mg once a week  The patient has been instructed regarding the correct time, dose, and frequency of taking this medication, including desired effects and most common side effects.   Donavan Foil 9:14 AM 10/23/2021

## 2021-10-23 NOTE — Patient Instructions (Addendum)
Bactine max spray   Pneumococcal Conjugate Vaccine (Prevnar 20) Suspension for Injection What is this medication? PNEUMOCOCCAL VACCINE (NEU mo KOK al vak SEEN) is a vaccine. It prevents pneumococcus bacterial infections. These bacteria can cause serious infections like pneumonia, meningitis, and blood infections. This vaccine will not treat an infection and will not cause infection. This vaccine is recommended for adults 18 years and older. This medicine may be used for other purposes; ask your health care provider or pharmacist if you have questions. COMMON BRAND NAME(S): Prevnar 20 What should I tell my care team before I take this medication? They need to know if you have any of these conditions: bleeding disorder fever immune system problems an unusual or allergic reaction to pneumococcal vaccine, diphtheria toxoid, other vaccines, other medicines, foods, dyes, or preservatives pregnant or trying to get pregnant breast-feeding How should I use this medication? This vaccine is injected into a muscle. It is given by a health care provider. A copy of Vaccine Information Statements will be given before each vaccination. Be sure to read this information carefully each time. This sheet may change often. Talk to your health care provider about the use of this medicine in children. Special care may be needed. Overdosage: If you think you have taken too much of this medicine contact a poison control center or emergency room at once. NOTE: This medicine is only for you. Do not share this medicine with others. What if I miss a dose? This does not apply. This medicine is not for regular use. What may interact with this medication? medicines for cancer chemotherapy medicines that suppress your immune function steroid medicines like prednisone or cortisone This list may not describe all possible interactions. Give your health care provider a list of all the medicines, herbs, non-prescription drugs,  or dietary supplements you use. Also tell them if you smoke, drink alcohol, or use illegal drugs. Some items may interact with your medicine. What should I watch for while using this medication? Mild fever and pain should go away in 3 days or less. Report any unusual symptoms to your health care provider. What side effects may I notice from receiving this medication? Side effects that you should report to your doctor or health care professional as soon as possible: allergic reactions (skin rash, itching or hives; swelling of the face, lips, or tongue) confusion fast, irregular heartbeat fever over 102 degrees F muscle weakness seizures trouble breathing unusual bruising or bleeding Side effects that usually do not require medical attention (report to your doctor or health care professional if they continue or are bothersome): fever of 102 degrees F or less headache joint pain muscle cramps, pain pain, tender at site where injected This list may not describe all possible side effects. Call your doctor for medical advice about side effects. You may report side effects to FDA at 1-800-FDA-1088. Where should I keep my medication? This vaccine is only given by a health care provider. It will not be stored at home. NOTE: This sheet is a summary. It may not cover all possible information. If you have questions about this medicine, talk to your doctor, pharmacist, or health care provider.  2023 Elsevier/Gold Standard (2019-09-28 00:00:00)

## 2021-10-23 NOTE — Progress Notes (Signed)
Chief Complaint  Patient presents with   Annual Exam   F/u  1. Htn controlled DM 2 a1c >10.7 12/10/20 lipitor 20 mg qs, hctz 12.5 mg qd losartan 100 mg qd metformin xr 500 (1000 mg bid) has not had mounjaro in  49month was up to 7.5 weekly due to shortage [facility]   Review of Systems  Constitutional:  Negative for weight loss.  HENT:  Negative for hearing loss.   Eyes:  Negative for blurred vision.  Respiratory:  Negative for shortness of breath.   Cardiovascular:  Negative for chest pain.  Gastrointestinal:  Negative for abdominal pain and blood in stool.  Musculoskeletal:  Negative for back pain.  Skin:  Negative for rash.  Neurological:  Negative for headaches.  Psychiatric/Behavioral:  Negative for depression.    Past Medical History:  Diagnosis Date   Asthma    child   COVID-19    x2   Diabetes mellitus without complication ([place]    Hyperlipidemia    Hypertension    Morbid obesity ([place]    Sinus tachycardia    Past Surgical History:  Procedure Laterality Date   right wrist fracture     Family History  Problem Relation Age of Onset   Hypertension Mother    Arthritis Mother    Asthma Mother    Depression Mother    Hyperlipidemia Mother    Diabetes Mother    Hypertension Father    Depression Father    Hyperlipidemia Father    Intellectual disability Father    Stroke Father    Hepatitis C Father    Kidney failure Father        s/p transplant    Heart disease Maternal Grandfather    Diabetes Paternal Grandmother    Drug abuse Paternal Grandmother    Diabetes Maternal Grandmother    Social History   Socioeconomic History   Marital status: Married    Spouse name: Not on file   Number of children: Not on file   Years of education: Not on file   Highest education level: Not on file  Occupational History   Not on file  Tobacco Use   Smoking status: Never   Smokeless tobacco: Never  Substance and Sexual Activity   Alcohol use: Yes     Alcohol/week: 1.0 standard drink of alcohol    Types: 1 Cans of beer per week    Comment: occasional   Drug use: No   Sexual activity: Yes  Other Topics Concern   Not on file  Social History Narrative   Diploma, deputy detention officer    Married    2 kids 768y.o boy, 627 monthboy, wife pregnant as of 06/20/19    -as of 01/23/20 pt has 3 boys       Drinks occasionally    Never smoker, no chew    Owns guns, wears seat belt, safe in relationship       Social Determinants of Health   Financial Resource Strain: Medium Risk (10/15/2020)   Overall Financial Resource Strain (CARDIA)    Difficulty of Paying Living Expenses: Somewhat hard  Food Insecurity: Not on file  Transportation Needs: Not on file  Physical Activity: Not on file  Stress: Not on file  Social Connections: Not on file  Intimate Partner Violence: Not on file   Current Meds  Medication Sig   CINNAMON PO Take by mouth.   fluticasone (FLONASE) 50 MCG/ACT nasal spray Place 1 spray  into both nostrils daily as needed for allergies or rhinitis.   glucose blood (ONETOUCH ULTRA) test strip USE TO TEST BLOOD SUGAR 3 TIMES A DAY   Lancets (ONETOUCH DELICA PLUS [id number]) MISC USE THREE TIMES DAILY AS DIRECTED   loratadine (CLARITIN) 10 MG tablet Take 1 tablet (10 mg total) by mouth daily.   Multiple Vitamin (MULTIVITAMIN PO) Take by mouth.   Omega-3 Fatty Acids (FISH OIL) 1200 MG CPDR Take 1 capsule by mouth.   ondansetron (ZOFRAN) 4 MG tablet Take 1 tablet (4 mg total) by mouth 2 (two) times daily as needed.   tirzepatide (MOUNJARO) 10 MG/0.5ML Pen Inject 10 mg into the skin once a week.   tirzepatide (MOUNJARO) 10 MG/0.5ML Pen Inject 10 mg into the skin once a week. X 1 month   tirzepatide (MOUNJARO) 12.5 MG/0.5ML Pen Inject 12.5 mg into the skin once a week.   tirzepatide (MOUNJARO) 12.5 MG/0.5ML Pen Inject 12.5 mg into the skin once a week. X 1 month   tirzepatide (MOUNJARO) 15 MG/0.5ML Pen Inject 15 mg into the skin once a  week.   tirzepatide [facility]) 2.5 MG/0.5ML Pen Inject 2.5 mg into the skin once a week.   tirzepatide [facility]) 5 MG/0.5ML Pen Inject 5 mg into the skin once a week. Month 2   tirzepatide (MOUNJARO) 5 MG/0.5ML Pen Inject 5 mg into the skin once a week for 1 month.   tirzepatide (MOUNJARO) 7.5 MG/0.5ML Pen INJECT 7.5MG INTO THE SKIN ONCE A WEEK   tirzepatide (MOUNJARO) 7.5 MG/0.5ML Pen Inject 7.5 mg into the skin once a week. X 1 month   [DISCONTINUED] atorvastatin (LIPITOR) 20 MG tablet TAKE 1 TABLET(20 MG) BY MOUTH DAILY   [DISCONTINUED] Cholecalciferol 1.25 MG (50000 UT) capsule Take 1 capsule (50,000 Units total) by mouth once a week. D3 d/c after 6 months   [DISCONTINUED] hydrochlorothiazide (HYDRODIURIL) 12.5 MG tablet TAKE 1 TABLET(12.5 MG) BY MOUTH DAILY IN THE MORNING   [DISCONTINUED] losartan (COZAAR) 100 MG tablet TAKE 1 TABLET(100 MG) BY MOUTH DAILY   [DISCONTINUED] metFORMIN (GLUCOPHAGE-XR) 500 MG 24 hr tablet TAKE 2 TABLETS(1000 MG) BY MOUTH TWICE DAILY WITH A MEAL   [DISCONTINUED] mupirocin ointment (BACTROBAN) 2 % Apply 1 Application topically 2 (two) times daily. Right foot   [DISCONTINUED] ondansetron (ZOFRAN) 4 MG tablet Take 1 tablet (4 mg total) by mouth 2 (two) times daily as needed.   No Known Allergies Recent Results (from the past 2160 hour(s))  Comprehensive metabolic panel     Status: Abnormal   Collection Time: 10/23/21  9:18 AM  Result Value Ref Range   Sodium 136 135 - 145 mEq/L   Potassium 4.7 3.5 - 5.1 mEq/L   Chloride 96 96 - 112 mEq/L   CO2 30 19 - 32 mEq/L   Glucose, Bld 222 (H) 70 - 99 mg/dL   BUN 13 6 - 23 mg/dL   Creatinine, Ser 0.69 0.40 - 1.50 mg/dL   Total Bilirubin 0.7 0.2 - 1.2 mg/dL   Alkaline Phosphatase 70 39 - 117 U/L   AST 22 0 - 37 U/L   ALT 36 0 - 53 U/L   Total Protein 6.9 6.0 - 8.3 g/dL   Albumin 4.2 3.5 - 5.2 g/dL   GFR 123.19 >60.00 mL/min    Comment: Calculated using the CKD-EPI Creatinine Equation (2021)   Calcium 9.4 8.4 -  10.5 mg/dL  Lipid panel     Status: Abnormal   Collection Time: 10/23/21  9:18 AM  Result  Value Ref Range   Cholesterol 134 0 - 200 mg/dL    Comment: ATP III Classification       Desirable:  < 200 mg/dL               Borderline High:  200 - 239 mg/dL          High:  > = 240 mg/dL   Triglycerides 237.0 (H) 0.0 - 149.0 mg/dL    Comment: Normal:  <150 mg/dLBorderline High:  150 - 199 mg/dL   HDL 36.80 (L) >39.00 mg/dL   VLDL 47.4 (H) 0.0 - 40.0 mg/dL   Total CHOL/HDL Ratio 4     Comment:                Men          Women1/2 Average Risk     3.4          3.3Average Risk          5.0          4.42X Average Risk          9.6          7.13X Average Risk          15.0          11.0                       NonHDL 97.36     Comment: NOTE:  Non-HDL goal should be 30 mg/dL higher than patient's LDL goal (i.e. LDL goal of < 70 mg/dL, would have non-HDL goal of < 100 mg/dL)  Hemoglobin A1c     Status: Abnormal   Collection Time: 10/23/21  9:18 AM  Result Value Ref Range   Hgb A1c MFr Bld 11.0 (H) 4.6 - 6.5 %    Comment: Glycemic Control Guidelines for People with Diabetes:Non Diabetic:  <6%Goal of Therapy: <7%Additional Action Suggested:  >8%   Vitamin D (25 hydroxy)     Status: Abnormal   Collection Time: 10/23/21  9:18 AM  Result Value Ref Range   VITD 23.67 (L) 30.00 - 100.00 ng/mL  LDL cholesterol, direct     Status: None   Collection Time: 10/23/21  9:18 AM  Result Value Ref Range   Direct LDL 78.0 mg/dL    Comment: Optimal:  <100 mg/dLNear or Above Optimal:  100-129 mg/dLBorderline High:  130-159 mg/dLHigh:  160-189 mg/dLVery High:  >190 mg/dL  Urinalysis, Routine w reflex microscopic     Status: Abnormal   Collection Time: 10/23/21  9:19 AM  Result Value Ref Range   Color, Urine YELLOW YELLOW   APPearance CLEAR CLEAR   Specific Gravity, Urine 1.017 1.001 - 1.035   pH 7.0 5.0 - 8.0   Glucose, UA TRACE (A) NEGATIVE   Bilirubin Urine NEGATIVE NEGATIVE   Ketones, ur NEGATIVE NEGATIVE   Hgb  urine dipstick NEGATIVE NEGATIVE   Protein, ur NEGATIVE NEGATIVE   Nitrite NEGATIVE NEGATIVE   Leukocytes,Ua NEGATIVE NEGATIVE  Microalbumin / creatinine urine ratio     Status: Abnormal   Collection Time: 10/23/21  9:19 AM  Result Value Ref Range   Creatinine, Urine 81 20 - 320 mg/dL   Microalb, Ur 2.4 mg/dL    Comment: Reference Range Not established    Microalb Creat Ratio 30 (H) <30 mcg/mg creat    Comment: . The ADA defines abnormalities in albumin excretion as follows: Marland Kitchen Albuminuria Category  Result (mcg/mg creatinine) . Normal to Mildly increased   <30 Moderately increased         30-299  Severely increased           > OR = 300 . The ADA recommends that at least two of three specimens collected within a 3-6 month period be abnormal before considering a patient to be within a diagnostic category.    Objective  Body mass index is 53.7 kg/m. Wt Readings from Last 3 Encounters:  10/23/21 (!) 407 lb (184.6 kg)  12/10/20 (!) 422 lb 12.8 oz (191.8 kg)  09/17/20 (!) 423 lb (191.9 kg)   Temp Readings from Last 3 Encounters:  10/23/21 97.9 F (36.6 C) (Oral)  12/10/20 97.6 F (36.4 C) (Oral)  04/23/20 98 F (36.7 C) (Oral)   BP Readings from Last 3 Encounters:  10/23/21 130/80  12/10/20 124/88  04/23/20 (!) 140/92   Pulse Readings from Last 3 Encounters:  10/23/21 74  12/10/20 83  04/23/20 88    Physical Exam Vitals and nursing note reviewed.  Constitutional:      Appearance: Normal appearance. He is well-developed and well-groomed.  HENT:     Head: Normocephalic and atraumatic.  Eyes:     Conjunctiva/sclera: Conjunctivae normal.     Pupils: Pupils are equal, round, and reactive to light.  Cardiovascular:     Rate and Rhythm: Normal rate and regular rhythm.     Heart sounds: Normal heart sounds.  Pulmonary:     Effort: Pulmonary effort is normal. No respiratory distress.     Breath sounds: Normal breath sounds.  Abdominal:     Tenderness:  There is no abdominal tenderness.  Skin:    General: Skin is warm and moist.  Neurological:     General: No focal deficit present.     Mental Status: He is alert and oriented to person, place, and time. Mental status is at baseline.     Sensory: Sensation is intact.     Motor: Motor function is intact.     Coordination: Coordination is intact.     Gait: Gait is intact. Gait normal.  Psychiatric:        Attention and Perception: Attention and perception normal.        Mood and Affect: Mood and affect normal.        Speech: Speech normal.        Behavior: Behavior normal. Behavior is cooperative.        Thought Content: Thought content normal.        Cognition and Memory: Cognition and memory normal.        Judgment: Judgment normal.     Assessment  Plan  Hypertension controlled associated with diabetes last A1c 10.7 Plan: Comprehensive metabolic panel, Lipid panel, Hemoglobin A1c, Urinalysis, Routine w reflex microscopic, Microalbumin / creatinine urine ratio, tirzepatide (MOUNJARO) 5 MG/0.5ML Pen, tirzepatide (MOUNJARO) 7.5 MG/0.5ML Pen, tirzepatide (MOUNJARO) 10 MG/0.5ML Pen, tirzepatide (MOUNJARO) 12.5 MG/0.5ML Pen, metFORMIN (GLUCOPHAGE-XR) 500 MG 24 hr tablet Sample mounjaro 2.5 weekly given  ROI [employer]  Essential hypertension - Plan: hydrochlorothiazide (HYDRODIURIL) 12.5 MG tablet, losartan (COZAAR) 100 MG tablet  Vitamin D deficiency - Plan: Vitamin D (25 hydroxy), Cholecalciferol 1.25 MG (50000 UT) capsule  BMI 50.0-59.9, adult class 3 obesity due to excess calories- Plan: tirzepatide (MOUNJARO) 5 MG/0.5ML Pen, tirzepatide (MOUNJARO) 7.5 MG/0.5ML Pen, tirzepatide (MOUNJARO) 10 MG/0.5ML Pen, tirzepatide (MOUNJARO) 12.5 MG/0.5ML Pen Sample 2.5 weekly given to titrate to up  Nausea - Plan: ondansetron (  ZOFRAN) 4 MG tablet  Hyperlipidemia, unspecified hyperlipidemia type - Plan: atorvastatin (LIPITOR) 20 MG tablet   Abrasion  right foot- Plan: mupirocin ointment  (BACTROBAN) 2 %, DISCONTINUED: mupirocin ointment (BACTROBAN) 2 %   HM Declines flu  Tdap utd 3/3 moderna  declines  Consider pna 20 vaccine Had 2/2 hep B vaccines immune rec MMR in future not immune  rec healthy diet and exercise goat wt [id number]  Specialists  Oral surgery [facility] Derm Dr. Nehemiah Massed   Provider: Dr. Olivia Mackie [name]-Internal Medicine

## 2021-10-24 LAB — MICROALBUMIN / CREATININE URINE RATIO
Creatinine, Urine: 81 mg/dL (ref 20–320)
Microalb Creat Ratio: 30 mcg/mg creat — ABNORMAL HIGH (ref <30)
Microalb, Ur: 2.4 mg/dL

## 2021-10-24 LAB — URINALYSIS, ROUTINE W REFLEX MICROSCOPIC
Bilirubin Urine: NEGATIVE
Hgb urine dipstick: NEGATIVE
Ketones, ur: NEGATIVE
Leukocytes,Ua: NEGATIVE
Nitrite: NEGATIVE
Protein, ur: NEGATIVE
Specific Gravity, Urine: 1.017 (ref 1.001–1.035)
pH: 7 (ref 5.0–8.0)

## 2021-10-26 ENCOUNTER — Telehealth: Payer: Self-pay

## 2021-10-26 DIAGNOSIS — E66813 Obesity, class 3: Secondary | ICD-10-CM | POA: Insufficient documentation

## 2021-10-26 NOTE — Telephone Encounter (Signed)
-----   Message from Delorise Jackson, MD sent at 10/26/2021 10:47 AM EDT ----- Urine with glucose +protein control sugar Liver kidneys  Cholesterol triglycerides slight elevated goal is <150  A1c 11.0 rec get restarted on mounjaro and do not miss doses  -does he want to see endocrine to get diabetes under control the goal is <7.0 ?  Vitamin D take prescription weekly

## 2021-10-26 NOTE — Telephone Encounter (Signed)
LMTCB for lab results: Urine with glucose +protein control sugar  Liver kidneys  Cholesterol triglycerides slight elevated goal is <150  A1c 11.0 rec get restarted on mounjaro and do not miss doses  -does he want to see endocrine to get diabetes under control the goal is <7.0 ?   Vitamin D take prescription weekly

## 2021-10-27 ENCOUNTER — Telehealth: Payer: Self-pay

## 2021-10-27 NOTE — Telephone Encounter (Signed)
LMOM for pt to CB in regards to labs I also called pts spouse to get in contact with pt but her voicemail box is full.

## 2021-10-29 NOTE — Addendum Note (Signed)
Addended by: Orland Mustard on: 10/29/2021 02:15 PM   Modules accepted: Orders

## 2021-10-30 ENCOUNTER — Other Ambulatory Visit: Payer: Self-pay

## 2021-11-03 ENCOUNTER — Ambulatory Visit (INDEPENDENT_AMBULATORY_CARE_PROVIDER_SITE_OTHER): Payer: Managed Care, Other (non HMO) | Admitting: Dermatology

## 2021-11-03 DIAGNOSIS — L739 Follicular disorder, unspecified: Secondary | ICD-10-CM

## 2021-11-03 MED ORDER — DOXYCYCLINE MONOHYDRATE 100 MG PO CAPS: 100.0000 mg | ORAL_CAPSULE | Freq: Two times a day (BID) | ORAL | 0 refills | Status: AC

## 2021-11-03 MED ORDER — CLINDAMYCIN PHOSPHATE 1 % EX LOTN: TOPICAL_LOTION | Freq: Every day | CUTANEOUS | 0 refills | Status: DC

## 2021-11-03 MED ORDER — MUPIROCIN 2 % EX OINT: 1.0000 | TOPICAL_OINTMENT | Freq: Every day | CUTANEOUS | 0 refills | Status: DC

## 2021-11-03 NOTE — Progress Notes (Signed)
   New Patient Visit  Subjective  Rickey Payne is a 30 y.o. male who presents for the following: Rash (Patient here today for rash at left lower leg. Patient got a new tattoo 3 weeks ago and about 1 week later developed a rash, sometimes itchy. Patient did use aquaphor for about 1 week after getting tattoo then started using antibacterial soap and non-scented moisturizer. ).  Patient does have other tattoos and has never had a reaction to them before.   The following portions of the chart were reviewed this encounter and updated as appropriate:       Review of Systems:  No other skin or systemic complaints except as noted in HPI or Assessment and Plan.  Objective  Well appearing patient in no apparent distress; mood and affect are within normal limits.  A focused examination was performed including left leg. Relevant physical exam findings are noted in the Assessment and Plan.  Left Lower Leg Multiple pink firm scaly papules     Assessment & Plan  Folliculitis Left Lower Leg  Likely from recent tattoo procedure  Bacterial culture today Start doxycycline monohydrate BID with food for 2 weeks then decrease to once daily. # 60 Continue antibacterial soap Start mupirocin to crusted areas twice daily.  Start clindamycin lotion daily to affected areas.   Doxycycline should be taken with food to prevent nausea. Do not lay down for 30 minutes after taking. Be cautious with sun exposure and use good sun protection while on this medication. Pregnant women should not take this medication.   Recommend [facility] Hypochlorous Spray (found in the wound care section) OR Cln brand Acne or Sports wash. The [facility] Hypochlorous Spray can be sprayed on daily and left on. The Cln wash should be applied to the affected area daily for at least 30 seconds and then rinsed off. If you are using clindamycin solution or lotion or another topical antibiotic to treat acne, using a hypochlorous product  may help lower the risk of antibiotic resistant bacteria.    mupirocin ointment (BACTROBAN) 2 % - Left Lower Leg Apply 1 Application topically daily.  doxycycline (MONODOX) 100 MG capsule - Left Lower Leg Take 1 capsule (100 mg total) by mouth 2 (two) times daily. Take with food  clindamycin (CLEOCIN-T) 1 % lotion - Left Lower Leg Apply topically daily.  Related Procedures Anaerobic and Aerobic Culture   Return in about 4 weeks (around 12/01/2021) for Rash.  Graciella Belton, RMA, am acting as scribe for Brendolyn Patty, MD .  Documentation: I have reviewed the above documentation for accuracy and completeness, and I agree with the above.  Brendolyn Patty MD

## 2021-11-03 NOTE — Patient Instructions (Addendum)
Start doxycycline monohydrate twice daily with food for 2 weeks then decrease to once daily. Continue antibacterial soap. Start mupirocin ointment to affected areas daily. Start clindamycin lotion to affected areas daily.   Doxycycline should be taken with food to prevent nausea. Do not lay down for 30 minutes after taking. Be cautious with sun exposure and use good sun protection while on this medication. Pregnant women should not take this medication.   Recommend [facility] Hypochlorous Spray (found in the wound care section) OR Cln brand Acne or Sports wash. The [facility] Hypochlorous Spray can be sprayed on daily and left on. The Cln wash should be applied to the affected area daily for at least 30 seconds and then rinsed off. If you are using clindamycin solution or lotion or another topical antibiotic to treat acne, using a hypochlorous product may help lower the risk of antibiotic resistant bacteria.    Due to recent changes in healthcare laws, you may see results of your pathology and/or laboratory studies on MyChart before the doctors have had a chance to review them. We understand that in some cases there may be results that are confusing or concerning to you. Please understand that not all results are received at the same time and often the doctors may need to interpret multiple results in order to provide you with the best plan of care or course of treatment. Therefore, we ask that you please give [place] 2 business days to thoroughly review all your results before contacting the office for clarification. Should we see a critical lab result, you will be contacted sooner.   If You Need Anything After Your Visit  If you have any questions or concerns for your doctor, please call our main line at [phone] and press option 4 to reach your doctor's medical assistant. If no one answers, please leave a voicemail as directed and we will return your call as soon as possible. Messages left after 4 pm  will be answered the following business day.   You may also send [place] a message via MyChart. We typically respond to MyChart messages within 1-2 business days.  For prescription refills, please ask your pharmacy to contact our office. Our fax number is [phone].  If you have an urgent issue when the clinic is closed that cannot wait until the next business day, you can page your doctor at the number below.    Please note that while we do our best to be available for urgent issues outside of office hours, we are not available 24/7.   If you have an urgent issue and are unable to reach [place], you may choose to seek medical care at your doctor's office, retail clinic, urgent care center, or emergency room.  If you have a medical emergency, please immediately call 911 or go to the emergency department.  Pager Numbers  - Dr. Gwen Pounds: [phone]  - Dr. Neale Burly: [phone]  - Dr. Roseanne Reno: [phone]  In the event of inclement weather, please call our main line at [phone] for an update on the status of any delays or closures.  Dermatology Medication Tips: Please keep the boxes that topical medications come in in order to help keep track of the instructions about where and how to use these. Pharmacies typically print the medication instructions only on the boxes and not directly on the medication tubes.   If your medication is too expensive, please contact our office at [phone] option 4 or send [place] a message through MyChart.  We are unable to tell what your co-pay for medications will be in advance as this is different depending on your insurance coverage. However, we may be able to find a substitute medication at lower cost or fill out paperwork to get insurance to cover a needed medication.   If a prior authorization is required to get your medication covered by your insurance company, please allow [place] 1-2 business days to complete this process.  Drug prices often vary depending  on where the prescription is filled and some pharmacies may offer cheaper prices.  The website www.goodrx.com contains coupons for medications through different pharmacies. The prices here do not account for what the cost may be with help from insurance (it may be cheaper with your insurance), but the website can give you the price if you did not use any insurance.  - You can print the associated coupon and take it with your prescription to the pharmacy.  - You may also stop by our office during regular business hours and pick up a GoodRx coupon card.  - If you need your prescription sent electronically to a different pharmacy, notify our office through [facility] or by phone at [phone] option 4.     Si Usted Necesita Algo Despus de Su Visita  Tambin puede enviarnos un mensaje a travs de Pharmacist, community. Por lo general respondemos a los mensajes de MyChart en el transcurso de 1 a 2 das hbiles.  Para renovar recetas, por favor pida a su farmacia que se ponga en contacto con nuestra oficina. Harland Dingwall de fax es [place] [phone].  Si tiene un asunto urgente cuando la clnica est cerrada y que no puede esperar hasta el siguiente da hbil, puede llamar/localizar a su doctor(a) al nmero que aparece a continuacin.   Por favor, tenga en cuenta que aunque hacemos todo lo posible para estar disponibles para asuntos urgentes fuera del horario de [place], no estamos disponibles las 24 horas del da, los 7 das de la [place].   Si tiene un problema urgente y no puede comunicarse con nosotros, puede optar por buscar atencin mdica  en el consultorio de su doctor(a), en una clnica privada, en un centro de atencin urgente o en una sala de emergencias.  Si tiene Engineering geologist, por favor llame inmediatamente al 911 o vaya a la sala de emergencias.  Nmeros de bper  - Dr. Nehemiah Massed: [phone]  - Dra. Moye: [phone]  - Dra. Nicole Kindred: [phone]  En caso de  inclemencias del [place], por favor llame a Johnsie Kindred principal al [phone] para una actualizacin sobre el [place] de cualquier retraso o cierre.  Consejos para la medicacin en dermatologa: Por favor, guarde las cajas en las que vienen los medicamentos de uso tpico para ayudarle a seguir las instrucciones sobre dnde y cmo usarlos. Las farmacias generalmente imprimen las instrucciones del medicamento slo en las cajas y no directamente en los tubos del [place].   Si su medicamento es muy caro, por favor, pngase en contacto con Zigmund Daniel llamando al [phone] y presione la opcin 4 o envenos un mensaje a travs de Pharmacist, community.   No podemos decirle cul ser su copago por los medicamentos por adelantado ya que esto es diferente dependiendo de la cobertura de su seguro. Sin embargo, es posible que podamos encontrar un medicamento sustituto a Electrical engineer un formulario para que el seguro cubra el medicamento que se considera necesario.   Si se requiere una autorizacin previa para que su  compaa de seguros [place] su medicamento, por favor permtanos de 1 a 2 das hbiles para completar este proceso.  Los precios de los medicamentos varan con frecuencia dependiendo del Environmental consultant de dnde se surte la receta y alguna farmacias pueden ofrecer precios ms baratos.  El sitio web www.goodrx.com tiene cupones para medicamentos de Airline pilot. Los precios aqu no tienen en cuenta lo que podra costar con la ayuda del seguro (puede ser ms barato con su seguro), pero el sitio web puede darle el precio si no utiliz Research scientist (physical sciences).  - Puede imprimir el cupn correspondiente y llevarlo con su receta a la farmacia.  - Tambin puede pasar por nuestra oficina durante el horario de atencin regular y Charity fundraiser una tarjeta de cupones de GoodRx.  - Si necesita que su receta se enve electrnicamente a una farmacia diferente, informe a nuestra oficina a travs de MyChart de [place] o  por telfono llamando al [phone] y presione la opcin 4.

## 2021-11-09 ENCOUNTER — Telehealth: Payer: Self-pay

## 2021-11-09 LAB — ANAEROBIC AND AEROBIC CULTURE

## 2021-11-09 NOTE — Telephone Encounter (Signed)
-----   Message from Brendolyn Patty, MD sent at 11/09/2021 11:47 AM EDT ----- Positive for p.acnes, which is a bacteria that causes acne bumps.  Continue oral doxy and topical cindamycin as prescribed   - please call patient

## 2021-11-09 NOTE — Telephone Encounter (Signed)
Advised patient bacterial culture came back positive for a bacteria that causes acne bumps. He should continue oral doxycycline and topical clindamycin as prescribed. Will recheck on follow-up.

## 2021-11-23 ENCOUNTER — Other Ambulatory Visit: Payer: Self-pay

## 2021-11-23 ENCOUNTER — Other Ambulatory Visit: Payer: Self-pay | Admitting: Internal Medicine

## 2021-11-23 DIAGNOSIS — Z6841 Body Mass Index (BMI) 40.0 and over, adult: Secondary | ICD-10-CM

## 2021-11-23 DIAGNOSIS — E1159 Type 2 diabetes mellitus with other circulatory complications: Secondary | ICD-10-CM

## 2021-11-24 ENCOUNTER — Other Ambulatory Visit: Payer: Self-pay

## 2021-11-24 MED FILL — Tirzepatide Soln Auto-injector 7.5 MG/0.5ML: SUBCUTANEOUS | Qty: 2 | Fill #0 | Status: CN

## 2021-11-25 ENCOUNTER — Other Ambulatory Visit: Payer: Self-pay

## 2021-12-14 ENCOUNTER — Ambulatory Visit: Payer: Managed Care, Other (non HMO) | Admitting: Dermatology

## 2021-12-23 ENCOUNTER — Encounter: Payer: Self-pay | Admitting: Nurse Practitioner

## 2021-12-23 ENCOUNTER — Other Ambulatory Visit: Payer: Self-pay

## 2021-12-23 ENCOUNTER — Telehealth: Payer: Self-pay

## 2021-12-23 ENCOUNTER — Ambulatory Visit (INDEPENDENT_AMBULATORY_CARE_PROVIDER_SITE_OTHER): Payer: Self-pay | Admitting: Nurse Practitioner

## 2021-12-23 VITALS — BP 118/92 | HR 76 | Ht 73.0 in | Wt >= 6400 oz

## 2021-12-23 DIAGNOSIS — E559 Vitamin D deficiency, unspecified: Secondary | ICD-10-CM

## 2021-12-23 DIAGNOSIS — E1165 Type 2 diabetes mellitus with hyperglycemia: Secondary | ICD-10-CM

## 2021-12-23 DIAGNOSIS — I10 Essential (primary) hypertension: Secondary | ICD-10-CM

## 2021-12-23 DIAGNOSIS — I152 Hypertension secondary to endocrine disorders: Secondary | ICD-10-CM

## 2021-12-23 DIAGNOSIS — E1159 Type 2 diabetes mellitus with other circulatory complications: Secondary | ICD-10-CM

## 2021-12-23 DIAGNOSIS — E785 Hyperlipidemia, unspecified: Secondary | ICD-10-CM

## 2021-12-23 MED ORDER — MOUNJARO 10 MG/0.5ML ~~LOC~~ SOAJ
10.0000 mg | SUBCUTANEOUS | 0 refills | Status: DC
  Filled 2021-12-23: qty 2, 28d supply, fill #0

## 2021-12-23 MED ORDER — ONETOUCH VERIO VI STRP: ORAL_STRIP | 12 refills | Status: AC

## 2021-12-23 MED ORDER — LORATADINE 10 MG PO TABS: 10.0000 mg | ORAL_TABLET | Freq: Every day | ORAL | 3 refills | Status: DC | PRN

## 2021-12-23 MED ORDER — ONETOUCH VERIO FLEX SYSTEM W/DEVICE KIT: PACK | 0 refills | Status: AC

## 2021-12-23 MED ORDER — METFORMIN HCL ER 500 MG PO TB24: 1000.0000 mg | ORAL_TABLET | Freq: Every day | ORAL | 3 refills | Status: DC

## 2021-12-23 MED ORDER — MOUNJARO 10 MG/0.5ML ~~LOC~~ SOAJ: 10.0000 mg | SUBCUTANEOUS | 0 refills | Status: DC

## 2021-12-23 NOTE — Telephone Encounter (Signed)
I think it was where I cleaned up his med list, he had numerous 10 mg Mounjaro on his list.  I will resend prescription.  I did tell him to decrease his Metformin to 1000 mg ER once daily.  I sent that script in as well.

## 2021-12-23 NOTE — Progress Notes (Signed)
Endocrinology Consult Note       12/23/2021, 11:53 AM   Subjective:    Patient ID: Rickey Payne, male    DOB: 1990/06/11.  Rickey Payne is being seen in consultation for management of currently uncontrolled symptomatic diabetes requested by  [name], Nino Glow, MD.   Past Medical History:  Diagnosis Date   Asthma    child   COVID-19    x2   Diabetes mellitus without complication ([place])    Hyperlipidemia    Hypertension    Morbid obesity ([place])    Sinus tachycardia     Past Surgical History:  Procedure Laterality Date   right wrist fracture      Social History   Socioeconomic History   Marital status: Married    Spouse name: Not on file   Number of children: Not on file   Years of education: Not on file   Highest education level: Not on file  Occupational History   Not on file  Tobacco Use   Smoking status: Never   Smokeless tobacco: Never  Substance and Sexual Activity   Alcohol use: Yes    Alcohol/week: 1.0 standard drink of alcohol    Types: 1 Cans of beer per week    Comment: occasional   Drug use: No   Sexual activity: Yes  Other Topics Concern   Not on file  Social History Narrative   Diploma, deputy detention officer    Married    2 kids 10 y.o boy, 54 month boy, wife pregnant as of 06/20/19    -as of 01/23/20 pt has 3 boys       Drinks occasionally    Never smoker, no chew    Owns guns, wears seat belt, safe in relationship       Social Determinants of Health   Financial Resource Strain: Medium Risk (10/15/2020)   Overall Financial Resource Strain (CARDIA)    Difficulty of Paying Living Expenses: Somewhat hard  Food Insecurity: Not on file  Transportation Needs: Not on file  Physical Activity: Not on file  Stress: Not on file  Social Connections: Not on file    Family History  Problem Relation Age of Onset   Hypertension Mother    Arthritis Mother    Asthma  Mother    Depression Mother    Hyperlipidemia Mother    Diabetes Mother    Hypertension Father    Depression Father    Hyperlipidemia Father    Intellectual disability Father    Stroke Father    Hepatitis C Father    Kidney failure Father        s/p transplant    Heart disease Maternal Grandfather    Diabetes Paternal Grandmother    Drug abuse Paternal Grandmother    Diabetes Maternal Grandmother     Outpatient Encounter Medications as of 12/23/2021  Medication Sig   atorvastatin (LIPITOR) 20 MG tablet TAKE 1 TABLET(20 MG) BY MOUTH DAILY   Blood Glucose Monitoring Suppl (ONETOUCH VERIO FLEX SYSTEM) w/Device KIT Use to monitor glucose twice daily   Cholecalciferol 1.25 MG (50000 UT) capsule Take 1 capsule (50,000 Units total) by mouth once a week. D3  d/c after 6 months   fluticasone (FLONASE) 50 MCG/ACT nasal spray Place 1 spray into both nostrils daily as needed for allergies or rhinitis.   glucose blood (ONETOUCH VERIO) test strip Use as instructed   hydrochlorothiazide (HYDRODIURIL) 12.5 MG tablet TAKE 1 TABLET(12.5 MG) BY MOUTH DAILY IN THE MORNING   Lancets (ONETOUCH DELICA PLUS [id number]) MISC USE THREE TIMES DAILY AS DIRECTED   losartan (COZAAR) 100 MG tablet TAKE 1 TABLET(100 MG) BY MOUTH DAILY   metFORMIN (GLUCOPHAGE-XR) 500 MG 24 hr tablet TAKE 2 TABLETS(1000 MG) BY MOUTH TWICE DAILY WITH A MEAL   Multiple Vitamin (MULTIVITAMIN PO) Take by mouth.   ondansetron (ZOFRAN) 4 MG tablet Take 1 tablet (4 mg total) by mouth 2 (two) times daily as needed.   tirzepatide (MOUNJARO) 10 MG/0.5ML Pen Inject 10 mg into the skin once a week.   [DISCONTINUED] glucose blood (ONETOUCH ULTRA) test strip USE TO TEST BLOOD SUGAR 3 TIMES A DAY   [DISCONTINUED] loratadine (CLARITIN) 10 MG tablet Take 1 tablet (10 mg total) by mouth daily.   [DISCONTINUED] mupirocin ointment (BACTROBAN) 2 % Apply 1 Application topically 2 (two) times daily. Right foot   [DISCONTINUED] mupirocin ointment  (BACTROBAN) 2 % Apply 1 Application topically daily.   [DISCONTINUED] Omega-3 Fatty Acids (FISH OIL) 1200 MG CPDR Take 1 capsule by mouth.   [DISCONTINUED] tirzepatide (MOUNJARO) 10 MG/0.5ML Pen Inject 10 mg into the skin once a week. X 1 month   [DISCONTINUED] tirzepatide (MOUNJARO) 12.5 MG/0.5ML Pen Inject 12.5 mg into the skin once a week.   [DISCONTINUED] tirzepatide (MOUNJARO) 12.5 MG/0.5ML Pen Inject 12.5 mg into the skin once a week. X 1 month   [DISCONTINUED] tirzepatide (MOUNJARO) 15 MG/0.5ML Pen Inject 15 mg into the skin once a week.   [DISCONTINUED] tirzepatide [facility]) 2.5 MG/0.5ML Pen Inject 2.5 mg into the skin once a week.   [DISCONTINUED] tirzepatide [facility]) 5 MG/0.5ML Pen Inject 5 mg into the skin once a week. Month 2   [DISCONTINUED] tirzepatide (MOUNJARO) 5 MG/0.5ML Pen Inject 5 mg into the skin once a week for 1 month.   [DISCONTINUED] tirzepatide (MOUNJARO) 7.5 MG/0.5ML Pen INJECT 7.5MG INTO THE SKIN ONCE A WEEK   [DISCONTINUED] tirzepatide (MOUNJARO) 7.5 MG/0.5ML Pen Inject 7.5 mg into the skin once a week for 1 month   loratadine (CLARITIN) 10 MG tablet Take 1 tablet (10 mg total) by mouth daily as needed for allergies.   [DISCONTINUED] CINNAMON PO Take by mouth. (Patient not taking: Reported on 12/23/2021)   [DISCONTINUED] clindamycin (CLEOCIN-T) 1 % lotion Apply topically daily. (Patient not taking: Reported on 12/23/2021)   No facility-administered encounter medications on file as of 12/23/2021.    ALLERGIES: No Known Allergies  VACCINATION STATUS: Immunization History  Administered Date(s) Administered   Hepb-cpg 04/05/2018, 05/10/2018   Influenza-Unspecified 06/08/2020   MMR 04/19/2018   Moderna SARS-COV2 Booster Vaccination 06/08/2020   Moderna Sars-Covid-2 Vaccination 07/08/2019, 07/22/2019   Tdap 01/23/2020    Diabetes He presents for his initial diabetic visit. He has type 2 diabetes mellitus. Onset time: Diagnosed at approx age of 61. There are  no hypoglycemic associated symptoms. Associated symptoms include weight loss. There are no hypoglycemic complications. There are no diabetic complications. Risk factors for coronary artery disease include diabetes mellitus, obesity, male sex, hypertension, family history and dyslipidemia. Current diabetic treatment includes diet and oral agent (monotherapy) (and Mounjaro). He is compliant with treatment most of the time. His weight is decreasing steadily. He is following a generally healthy  diet. When asked about meal planning, he reported none. He has not had a previous visit with a dietitian. He participates in exercise daily. (He presents today for his consultation with no meter or logs to review.  His most recent A1c on 9/15 was 11%, increasing.  He states he had an skin infection from recent tattoo and thinks that could have contributed to his previous loss of control.  He does not monitor glucose routinely at home.  He drinks water and gatorade (only after working out) and only eats 2 meals per day plus snacks.  He exercises 4-5 days per week with cardio and weight bearing exercises.  He is UTD on eye exam, has never seen podiatry in the past.) An ACE inhibitor/angiotensin II receptor blocker is being taken. He does not see a podiatrist.Eye exam is current.     Review of systems  Constitutional: +steadily decreasing body weight, current Body mass index is 53.33 kg/m., no fatigue, no subjective hyperthermia, no subjective hypothermia Eyes: no blurry vision, no xerophthalmia ENT: no sore throat, no nodules palpated in throat, no dysphagia/odynophagia, no hoarseness Cardiovascular: no chest pain, no shortness of breath, no palpitations, no leg swelling Respiratory: no cough, no shortness of breath Gastrointestinal: no nausea/vomiting/diarrhea Musculoskeletal: no muscle/joint aches Skin: no rashes, no hyperemia Neurological: no tremors, no numbness, no tingling, no dizziness Psychiatric: no  depression, no anxiety  Objective:     BP (!) 118/92 (BP Location: Left Arm, Patient Position: Sitting, Cuff Size: Large)   Pulse 76   Ht _0  (1.854 m)   Wt (!) 404 lb 3.2 oz (183.3 kg)   BMI 53.33 kg/m   Wt Readings from Last 3 Encounters:  12/23/21 (!) 404 lb 3.2 oz (183.3 kg)  10/23/21 (!) 407 lb (184.6 kg)  12/10/20 (!) 422 lb 12.8 oz (191.8 kg)     BP Readings from Last 3 Encounters:  12/23/21 (!) 118/92  10/23/21 130/80  12/10/20 124/88     Physical Exam- Limited  Constitutional:  Body mass index is 53.33 kg/m. , not in acute distress, normal state of mind Eyes:  EOMI, no exophthalmos Neck: Supple Cardiovascular: RRR, no murmurs, rubs, or gallops, no edema Respiratory: Adequate breathing efforts, no crackles, rales, rhonchi, or wheezing Musculoskeletal: no gross deformities, strength intact in all four extremities, no gross restriction of joint movements Skin:  no rashes, no hyperemia Neurological: no tremor with outstretched hands    CMP ( most recent) CMP     Component Value Date/Time   NA 136 10/23/2021 0918   NA 141 04/23/2020 0813   K 4.7 10/23/2021 0918   CL 96 10/23/2021 0918   CO2 30 10/23/2021 0918   GLUCOSE 222 (H) 10/23/2021 0918   BUN 13 10/23/2021 0918   BUN 9 04/23/2020 0813   CREATININE 0.69 10/23/2021 0918   CALCIUM 9.4 10/23/2021 0918   PROT 6.9 10/23/2021 0918   PROT 7.1 04/23/2020 0813   ALBUMIN 4.2 10/23/2021 0918   ALBUMIN 4.7 04/23/2020 0813   AST 22 10/23/2021 0918   ALT 36 10/23/2021 0918   ALKPHOS 70 10/23/2021 0918   BILITOT 0.7 10/23/2021 0918   BILITOT 0.5 04/23/2020 0813   GFRNONAA 133 06/20/2019 1056   GFRAA 154 06/20/2019 1056     Diabetic Labs (most recent): Lab Results  Component Value Date   HGBA1C 11.0 (H) 10/23/2021   HGBA1C 10.7 (H) 12/10/2020   HGBA1C 8.9 (H) 04/23/2020   MICROALBUR 2.4 10/23/2021   MICROALBUR 11.6 01/23/2020  MICROALBUR 3.5 (H) 05/20/2017     Lipid Panel ( most recent) Lipid  Panel     Component Value Date/Time   CHOL 134 10/23/2021 0918   CHOL 220 (H) 04/23/2020 0813   TRIG 237.0 (H) 10/23/2021 0918   HDL 36.80 (L) 10/23/2021 0918   HDL 41 04/23/2020 0813   CHOLHDL 4 10/23/2021 0918   VLDL 47.4 (H) 10/23/2021 0918   LDLCALC 151 (H) 04/23/2020 0813   LDLDIRECT 78.0 10/23/2021 0918   LABVLDL 28 04/23/2020 0813      Lab Results  Component Value Date   TSH 2.14 12/10/2020   TSH 1.420 06/20/2019   TSH 2.08 03/01/2018   TSH 1.600 03/03/2017   FREET4 0.82 03/01/2018           Assessment & Plan:   1) Type 2 diabetes mellitus with hyperglycemia, without long-term current use of insulin ([place])  He presents today for his consultation with no meter or logs to review.  His most recent A1c on 9/15 was 11%, increasing.  He states he had an skin infection from recent tattoo and thinks that could have contributed to his previous loss of control.  He does not monitor glucose routinely at home.  He drinks water and gatorade (only after working out) and only eats 2 meals per day plus snacks.  He exercises 4-5 days per week with cardio and weight bearing exercises.  He is UTD on eye exam, has never seen podiatry in the past.  - Rickey Payne has currently uncontrolled symptomatic type 2 DM since 31 years of age, with most recent A1c of 11 %.   -Recent labs reviewed.  - I had a long discussion with him about the progressive nature of diabetes and the pathology behind its complications. -his diabetes is not currently complicated but he remains at a high risk for more acute and chronic complications which include CAD, CVA, CKD, retinopathy, and neuropathy. These are all discussed in detail with him.  The following Lifestyle Medicine recommendations according to [facility]) were discussed and offered to patient and he agrees to start the journey:  A. Whole Foods, Plant-based plate comprising of fruits and vegetables, plant-based  proteins, whole-grain carbohydrates was discussed in detail with the patient.   A list for source of those nutrients were also provided to the patient.  Patient will use only water or unsweetened tea for hydration. B.  The need to stay away from risky substances including alcohol, smoking; obtaining 7 to 9 hours of restorative sleep, at least 150 minutes of moderate intensity exercise weekly, the importance of healthy social connections,  and stress reduction techniques were discussed. C.  A full color page of  Calorie density of various food groups per pound showing examples of each food groups was provided to the patient.  - I have counseled him on diet and weight management by adopting a carbohydrate restricted/protein rich diet. Patient is encouraged to switch to unprocessed or minimally processed complex starch and increased protein intake (animal or plant source), fruits, and vegetables. -  he is advised to stick to a routine mealtimes to eat 3 meals a day and avoid unnecessary snacks (to snack only to correct hypoglycemia).   - he acknowledges that there is a room for improvement in his food and drink choices. - Suggestion is made for him to avoid simple carbohydrates from his diet including Cakes, Sweet Desserts, Ice Cream, Soda (diet and regular), Sweet Tea, Candies, Chips, Cookies,  Store Bought Juices, Alcohol in Excess of 1-2 drinks a day, Artificial Sweeteners, Coffee Creamer, and "Sugar-free" Products. This will help patient to have more stable blood glucose profile and potentially avoid unintended weight gain.  - I have approached him with the following individualized plan to manage his diabetes and patient agrees:   -he is encouraged to start monitoring glucose twice daily, before breakfast and before bed, to log their readings on the clinic sheets provided, and bring them to review at follow up appointment in 4 weeks.  - Adjustment parameters are given to him for hypo and hyperglycemia  in writing. - he is encouraged to call clinic for blood glucose levels less than 70 or above 300 mg /dl. - he is advised to continue Metformin 1000 mg ER but to only take it once daily after breakfast, therapeutically suitable for patient .  He can also continue his Mounjaro 10 mg SQ weekly for now (just recently started this dose less than a month ago).  - Specific targets for  A1c; LDL, HDL, and Triglycerides were discussed with the patient.  2) Blood Pressure /Hypertension:  his blood pressure is controlled to target.   he is advised to continue his current medications including Losartan 100 mg p.o. daily with breakfast.  3) Lipids/Hyperlipidemia:    Review of his recent lipid panel from 10/23/21 showed controlled LDL at 78 and elevated triglycerides of 237 .  he is advised to continue Lipitor 20 mg daily at bedtime.  Side effects and precautions discussed with him.  4)  Weight/Diet:  his Body mass index is 53.33 kg/m.  -  clearly complicating his diabetes care.   he is a candidate for weight loss. I discussed with him the fact that loss of 5 - 10% of his  current body weight will have the most impact on his diabetes management.  Exercise, and detailed carbohydrates information provided  -  detailed on discharge instructions.  5) Chronic Care/Health Maintenance: -he is on ACEI/ARB and Statin medications and is encouraged to initiate and continue to follow up with Ophthalmology, Dentist, Podiatrist at least yearly or according to recommendations, and advised to stay away from smoking. I have recommended yearly flu vaccine and pneumonia vaccine at least every 5 years; moderate intensity exercise for up to 150 minutes weekly; and sleep for at least 7 hours a day.  - he is advised to maintain close follow up with [name], Nino Glow, MD for primary care needs, as well as his other providers for optimal and coordinated care.   - Time spent in this patient care: 60 min, of which > 50% was  spent in counseling him about his diabetes and the rest reviewing his blood glucose logs, discussing his hypoglycemia and hyperglycemia episodes, reviewing his current and previous labs/studies (including abstraction from other facilities) and medications doses and developing a long term treatment plan based on the latest standards of care/guidelines; and documenting his care.    Please refer to Patient Instructions for Blood Glucose Monitoring and Insulin/Medications Dosing Guide" in media tab for additional information. Please also refer to "Patient Self Inventory" in the Media tab for reviewed elements of pertinent patient history.  Rickey Payne participated in the discussions, expressed understanding, and voiced agreement with the above plans.  All questions were answered to his satisfaction. he is encouraged to contact clinic should he have any questions or concerns prior to his return visit.     Follow up plan: - Return in about 1  month (around 01/22/2022) for Diabetes F/U, Bring meter and logs.    Rayetta Pigg, [facility] [address] [place], [place] [address] Phone: [phone] Fax: [phone]  12/23/2021, 11:53 AM

## 2021-12-23 NOTE — Telephone Encounter (Signed)
Left VM for pt 

## 2021-12-23 NOTE — Patient Instructions (Signed)

## 2021-12-23 NOTE — Telephone Encounter (Signed)
Pt said he called the pharmacy and said that you discontinued his Mounjaro. It does not look like that was called in. He said you told him to stop taking the metformin 2x a day and take just one. Please advise.

## 2021-12-29 ENCOUNTER — Ambulatory Visit: Payer: Managed Care, Other (non HMO) | Admitting: Dermatology

## 2021-12-30 ENCOUNTER — Other Ambulatory Visit (HOSPITAL_COMMUNITY): Payer: Self-pay

## 2022-01-01 ENCOUNTER — Other Ambulatory Visit (HOSPITAL_COMMUNITY): Payer: Self-pay

## 2022-01-20 ENCOUNTER — Ambulatory Visit (INDEPENDENT_AMBULATORY_CARE_PROVIDER_SITE_OTHER): Payer: Managed Care, Other (non HMO) | Admitting: Dermatology

## 2022-01-20 VITALS — BP 120/86

## 2022-01-20 DIAGNOSIS — L739 Follicular disorder, unspecified: Secondary | ICD-10-CM | POA: Diagnosis not present

## 2022-01-20 MED ORDER — CLINDAMYCIN PHOSPHATE 1 % EX LOTN: TOPICAL_LOTION | Freq: Every day | CUTANEOUS | 1 refills | Status: DC

## 2022-01-20 NOTE — Progress Notes (Signed)
   Follow-Up Visit   Subjective  Rickey Payne is a 31 y.o. male who presents for the following: Follow-up (Patient here today for 4 week follow up. Patient took doxycycline and is currently using clindamycin lotion and advises area of rash has improved. Patient also was using mupirocin to open areas but no longer needed. ).  Patient did have culture done at last visit which showed bacteria that causes acne bumps.   The following portions of the chart were reviewed this encounter and updated as appropriate:       Review of Systems:  No other skin or systemic complaints except as noted in HPI or Assessment and Plan.  Objective  Well appearing patient in no apparent distress; mood and affect are within normal limits.  A focused examination was performed including leg. Relevant physical exam findings are noted in the Assessment and Plan.  Left Lower Leg Few scattered dried hyperpigmented papules and macules within tatoo at lower leg    Assessment & Plan  Folliculitis Left Lower Leg  Improved on Doxycycline PO and Clindamycin lotion  Continue clindamycin lotion daily until all bumps have cleared.   May restart Po doxy prn flares/recurrence.     Related Medications clindamycin (CLEOCIN-T) 1 % lotion Apply topically daily. As needed   Return if symptoms worsen or fail to improve.  Anise Salvo, RMA, am acting as scribe for Willeen Niece, MD .  Documentation: I have reviewed the above documentation for accuracy and completeness, and I agree with the above.  Willeen Niece MD

## 2022-01-20 NOTE — Patient Instructions (Signed)
Due to recent changes in healthcare laws, you may see results of your pathology and/or laboratory studies on MyChart before the doctors have had a chance to review them. We understand that in some cases there may be results that are confusing or concerning to you. Please understand that not all results are received at the same time and often the doctors may need to interpret multiple results in order to provide you with the best plan of care or course of treatment. Therefore, we ask that you please give us 2 business days to thoroughly review all your results before contacting the office for clarification. Should we see a critical lab result, you will be contacted sooner.   If You Need Anything After Your Visit  If you have any questions or concerns for your doctor, please call our main line at [phone] and press option 4 to reach your doctor's medical assistant. If no one answers, please leave a voicemail as directed and we will return your call as soon as possible. Messages left after 4 pm will be answered the following business day.   You may also send us a message via MyChart. We typically respond to MyChart messages within 1-2 business days.  For prescription refills, please ask your pharmacy to contact our office. Our fax number is [phone].  If you have an urgent issue when the clinic is closed that cannot wait until the next business day, you can page your doctor at the number below.    Please note that while we do our best to be available for urgent issues outside of office hours, we are not available 24/7.   If you have an urgent issue and are unable to reach us, you may choose to seek medical care at your doctor's office, retail clinic, urgent care center, or emergency room.  If you have a medical emergency, please immediately call 911 or go to the emergency department.  Pager Numbers  - Dr. Kowalski: [phone]  - Dr. Moye: [phone]  - Dr. Stewart:  [phone]  In the event of inclement weather, please call our main line at [phone] for an update on the status of any delays or closures.  Dermatology Medication Tips: Please keep the boxes that topical medications come in in order to help keep track of the instructions about where and how to use these. Pharmacies typically print the medication instructions only on the boxes and not directly on the medication tubes.   If your medication is too expensive, please contact our office at [phone] option 4 or send us a message through MyChart.   We are unable to tell what your co-pay for medications will be in advance as this is different depending on your insurance coverage. However, we may be able to find a substitute medication at lower cost or fill out paperwork to get insurance to cover a needed medication.   If a prior authorization is required to get your medication covered by your insurance company, please allow us 1-2 business days to complete this process.  Drug prices often vary depending on where the prescription is filled and some pharmacies may offer cheaper prices.  The website www.goodrx.com contains coupons for medications through different pharmacies. The prices here do not account for what the cost may be with help from insurance (it may be cheaper with your insurance), but the website can give you the price if you did not use any insurance.  - You can print the associated coupon and take it with   your prescription to the pharmacy.  - You may also stop by our office during regular business hours and pick up a GoodRx coupon card.  - If you need your prescription sent electronically to a different pharmacy, notify our office through [facility] MyChart or by phone at [phone] option 4.     Si Usted Necesita Algo Despus de Su Visita  Tambin puede enviarnos un mensaje a travs de MyChart. Por lo general respondemos a los mensajes de MyChart en el transcurso de 1 a 2  das hbiles.  Para renovar recetas, por favor pida a su farmacia que se ponga en contacto con nuestra oficina. Nuestro nmero de fax es el [phone].  Si tiene un asunto urgente cuando la clnica est cerrada y que no puede esperar hasta el siguiente da hbil, puede llamar/localizar a su doctor(a) al nmero que aparece a continuacin.   Por favor, tenga en cuenta que aunque hacemos todo lo posible para estar disponibles para asuntos urgentes fuera del horario de oficina, no estamos disponibles las 24 horas del da, los 7 das de la semana.   Si tiene un problema urgente y no puede comunicarse con nosotros, puede optar por buscar atencin mdica  en el consultorio de su doctor(a), en una clnica privada, en un centro de atencin urgente o en una sala de emergencias.  Si tiene una emergencia mdica, por favor llame inmediatamente al 911 o vaya a la sala de emergencias.  Nmeros de bper  - Dr. Kowalski: [phone]  - Dra. Moye: [phone]  - Dra. Stewart: [phone]  En caso de inclemencias del tiempo, por favor llame a nuestra lnea principal al [phone] para una actualizacin sobre el estado de cualquier retraso o cierre.  Consejos para la medicacin en dermatologa: Por favor, guarde las cajas en las que vienen los medicamentos de uso tpico para ayudarle a seguir las instrucciones sobre dnde y cmo usarlos. Las farmacias generalmente imprimen las instrucciones del medicamento slo en las cajas y no directamente en los tubos del medicamento.   Si su medicamento es muy caro, por favor, pngase en contacto con nuestra oficina llamando al [phone] y presione la opcin 4 o envenos un mensaje a travs de MyChart.   No podemos decirle cul ser su copago por los medicamentos por adelantado ya que esto es diferente dependiendo de la cobertura de su seguro. Sin embargo, es posible que podamos encontrar un medicamento sustituto a menor costo o llenar un formulario para que el  seguro cubra el medicamento que se considera necesario.   Si se requiere una autorizacin previa para que su compaa de seguros cubra su medicamento, por favor permtanos de 1 a 2 das hbiles para completar este proceso.  Los precios de los medicamentos varan con frecuencia dependiendo del lugar de dnde se surte la receta y alguna farmacias pueden ofrecer precios ms baratos.  El sitio web www.goodrx.com tiene cupones para medicamentos de diferentes farmacias. Los precios aqu no tienen en cuenta lo que podra costar con la ayuda del seguro (puede ser ms barato con su seguro), pero el sitio web puede darle el precio si no utiliz ningn seguro.  - Puede imprimir el cupn correspondiente y llevarlo con su receta a la farmacia.  - Tambin puede pasar por nuestra oficina durante el horario de atencin regular y recoger una tarjeta de cupones de GoodRx.  - Si necesita que su receta se enve electrnicamente a una farmacia diferente, informe a nuestra oficina a travs de MyChart de    o por telfono llamando al [phone] y presione la opcin 4.  

## 2022-01-21 ENCOUNTER — Other Ambulatory Visit: Payer: Self-pay

## 2022-01-21 ENCOUNTER — Other Ambulatory Visit: Payer: Self-pay | Admitting: Nurse Practitioner

## 2022-01-21 DIAGNOSIS — E1159 Type 2 diabetes mellitus with other circulatory complications: Secondary | ICD-10-CM

## 2022-01-21 MED ORDER — MOUNJARO 10 MG/0.5ML ~~LOC~~ SOAJ
10.0000 mg | SUBCUTANEOUS | 2 refills | Status: DC
  Filled 2022-01-21: qty 2, 28d supply, fill #0

## 2022-01-21 NOTE — Patient Instructions (Signed)

## 2022-01-25 ENCOUNTER — Other Ambulatory Visit: Payer: Self-pay

## 2022-01-25 ENCOUNTER — Encounter: Payer: Self-pay | Admitting: Nurse Practitioner

## 2022-01-25 ENCOUNTER — Ambulatory Visit (INDEPENDENT_AMBULATORY_CARE_PROVIDER_SITE_OTHER): Payer: Managed Care, Other (non HMO) | Admitting: Nurse Practitioner

## 2022-01-25 VITALS — BP 135/76 | HR 87 | Ht 73.0 in | Wt 395.6 lb

## 2022-01-25 DIAGNOSIS — I10 Essential (primary) hypertension: Secondary | ICD-10-CM

## 2022-01-25 DIAGNOSIS — E785 Hyperlipidemia, unspecified: Secondary | ICD-10-CM

## 2022-01-25 DIAGNOSIS — E1159 Type 2 diabetes mellitus with other circulatory complications: Secondary | ICD-10-CM

## 2022-01-25 DIAGNOSIS — E559 Vitamin D deficiency, unspecified: Secondary | ICD-10-CM

## 2022-01-25 DIAGNOSIS — I152 Hypertension secondary to endocrine disorders: Secondary | ICD-10-CM

## 2022-01-25 DIAGNOSIS — E1165 Type 2 diabetes mellitus with hyperglycemia: Secondary | ICD-10-CM | POA: Diagnosis not present

## 2022-01-25 LAB — POCT GLYCOSYLATED HEMOGLOBIN (HGB A1C): Hemoglobin A1C: 6.9 % — AB (ref 4.0–5.6)

## 2022-01-25 MED ORDER — TIRZEPATIDE 12.5 MG/0.5ML ~~LOC~~ SOAJ
12.5000 mg | SUBCUTANEOUS | 1 refills | Status: DC
  Filled 2022-01-25: qty 2, 28d supply, fill #0
  Filled 2022-01-26: qty 6, 84d supply, fill #0
  Filled 2022-01-28: qty 2, 28d supply, fill #0
  Filled 2022-02-17 – 2022-02-24 (×2): qty 2, 28d supply, fill #1
  Filled 2022-03-24 (×2): qty 2, 28d supply, fill #2
  Filled 2022-03-24: qty 2, 28d supply, fill #0
  Filled 2022-04-20: qty 2, 28d supply, fill #1
  Filled 2022-05-21: qty 2, 28d supply, fill #2
  Filled 2022-06-17: qty 2, 28d supply, fill #3

## 2022-01-25 MED ORDER — METFORMIN HCL ER 500 MG PO TB24
500.0000 mg | ORAL_TABLET | Freq: Every day | ORAL | 3 refills | Status: DC
  Filled 2022-01-25: qty 90, 90d supply, fill #0

## 2022-01-25 NOTE — Progress Notes (Signed)
Endocrinology Follow Up Note       01/25/2022, 10:09 AM   Subjective:    Patient ID: Rickey Payne, male    DOB: 11/01/1990.  Rickey Payne is being seen in follow up after being seen in consultation for management of currently uncontrolled symptomatic diabetes requested by  [name], Nino Glow, MD.   Past Medical History:  Diagnosis Date   Asthma    child   COVID-19    x2   Diabetes mellitus without complication ([place])    Hyperlipidemia    Hypertension    Morbid obesity ([place])    Sinus tachycardia     Past Surgical History:  Procedure Laterality Date   right wrist fracture      Social History   Socioeconomic History   Marital status: Married    Spouse name: Not on file   Number of children: Not on file   Years of education: Not on file   Highest education level: Not on file  Occupational History   Not on file  Tobacco Use   Smoking status: Never   Smokeless tobacco: Never  Substance and Sexual Activity   Alcohol use: Yes    Alcohol/week: 1.0 standard drink of alcohol    Types: 1 Cans of beer per week    Comment: occasional   Drug use: No   Sexual activity: Yes  Other Topics Concern   Not on file  Social History Narrative   Diploma, deputy detention officer    Married    2 kids 37 y.o boy, 31 month boy, wife pregnant as of 06/20/19    -as of 01/23/20 pt has 3 boys       Drinks occasionally    Never smoker, no chew    Owns guns, wears seat belt, safe in relationship       Social Determinants of Health   Financial Resource Strain: Medium Risk (10/15/2020)   Overall Financial Resource Strain (CARDIA)    Difficulty of Paying Living Expenses: Somewhat hard  Food Insecurity: Not on file  Transportation Needs: Not on file  Physical Activity: Not on file  Stress: Not on file  Social Connections: Not on file    Family History  Problem Relation Age of Onset   Hypertension Mother     Arthritis Mother    Asthma Mother    Depression Mother    Hyperlipidemia Mother    Diabetes Mother    Hypertension Father    Depression Father    Hyperlipidemia Father    Intellectual disability Father    Stroke Father    Hepatitis C Father    Kidney failure Father        s/p transplant    Heart disease Maternal Grandfather    Diabetes Paternal Grandmother    Drug abuse Paternal Grandmother    Diabetes Maternal Grandmother     Outpatient Encounter Medications as of 01/25/2022  Medication Sig   atorvastatin (LIPITOR) 20 MG tablet TAKE 1 TABLET(20 MG) BY MOUTH DAILY   Blood Glucose Monitoring Suppl (ONETOUCH VERIO FLEX SYSTEM) w/Device KIT Use to monitor glucose twice daily   Cholecalciferol 1.25 MG (50000 UT) capsule Take 1 capsule (50,000 Units  total) by mouth once a week. D3 d/c after 6 months   clindamycin (CLEOCIN-T) 1 % lotion Apply topically daily. As needed   fluticasone (FLONASE) 50 MCG/ACT nasal spray Place 1 spray into both nostrils daily as needed for allergies or rhinitis.   glucose blood (ONETOUCH VERIO) test strip Use as instructed   hydrochlorothiazide (HYDRODIURIL) 12.5 MG tablet TAKE 1 TABLET(12.5 MG) BY MOUTH DAILY IN THE MORNING   Lancets (ONETOUCH DELICA PLUS [id number]) MISC USE THREE TIMES DAILY AS DIRECTED   loratadine (CLARITIN) 10 MG tablet Take 1 tablet (10 mg total) by mouth daily as needed for allergies.   losartan (COZAAR) 100 MG tablet TAKE 1 TABLET(100 MG) BY MOUTH DAILY   Multiple Vitamin (MULTIVITAMIN PO) Take by mouth.   ondansetron (ZOFRAN) 4 MG tablet Take 1 tablet (4 mg total) by mouth 2 (two) times daily as needed.   tirzepatide (MOUNJARO) 12.5 MG/0.5ML Pen Inject 12.5 mg into the skin once a week.   [DISCONTINUED] metFORMIN (GLUCOPHAGE-XR) 500 MG 24 hr tablet Take 2 tablets (1,000 mg total) by mouth daily with breakfast. TAKE 2 TABLETS(1000 MG) BY MOUTH TWICE DAILY WITH A MEAL   [DISCONTINUED] tirzepatide (MOUNJARO) 10 MG/0.5ML Pen Inject 10  mg into the skin once a week.   metFORMIN (GLUCOPHAGE-XR) 500 MG 24 hr tablet Take 1 tablet (500 mg total) by mouth daily with breakfast. TAKE 2 TABLETS(1000 MG) BY MOUTH TWICE DAILY WITH A MEAL   [DISCONTINUED] tirzepatide (MOUNJARO) 10 MG/0.5ML Pen Inject 10 mg into the skin once a week.   No facility-administered encounter medications on file as of 01/25/2022.    ALLERGIES: No Known Allergies  VACCINATION STATUS: Immunization History  Administered Date(s) Administered   Hepb-cpg 04/05/2018, 05/10/2018   Influenza-Unspecified 06/08/2020   MMR 04/19/2018   Moderna SARS-COV2 Booster Vaccination 06/08/2020   Moderna Sars-Covid-2 Vaccination 07/08/2019, 07/22/2019   Tdap 01/23/2020    Diabetes He presents for his follow-up diabetic visit. He has type 2 diabetes mellitus. Onset time: Diagnosed at approx age of 31. His disease course has been improving. There are no hypoglycemic associated symptoms. Associated symptoms include weight loss. There are no hypoglycemic complications. There are no diabetic complications. Risk factors for coronary artery disease include diabetes mellitus, obesity, male sex, hypertension, family history and dyslipidemia. Current diabetic treatment includes diet and oral agent (monotherapy) (and Mounjaro). He is compliant with treatment most of the time. His weight is decreasing steadily. He is following a generally healthy diet. When asked about meal planning, he reported none. He has not had a previous visit with a dietitian. He participates in exercise daily. His home blood glucose trend is decreasing steadily. His breakfast blood glucose range is generally 90-110 mg/dl. His overall blood glucose range is 140-180 mg/dl. (He presents today with his logs showing at goal fasting and postprandial readings.  His POCT A1c today is 6.9%, improving drastically from last visit of 11%.  He does note a little nausea from the [facility] but it is tolerable.  He has really worked hard  on his diet, has been consuming more fruits and veggies.  He denies any hypoglycemia.) An ACE inhibitor/angiotensin II receptor blocker is being taken. He does not see a podiatrist.Eye exam is current.     Review of systems  Constitutional: +steadily decreasing body weight, current Body mass index is 52.19 kg/m., no fatigue, no subjective hyperthermia, no subjective hypothermia Eyes: no blurry vision, no xerophthalmia ENT: no sore throat, no nodules palpated in throat, no dysphagia/odynophagia, no hoarseness Cardiovascular:  no chest pain, no shortness of breath, no palpitations, no leg swelling Respiratory: no cough, no shortness of breath Gastrointestinal: no nausea/vomiting/diarrhea Musculoskeletal: no muscle/joint aches Skin: no rashes, no hyperemia Neurological: no tremors, no numbness, no tingling, no dizziness Psychiatric: no depression, no anxiety  Objective:     BP 135/76 (BP Location: Left Arm, Patient Position: Sitting, Cuff Size: Large)   Pulse 87   Ht _0  (1.854 m)   Wt (!) 395 lb 9.6 oz (179.4 kg)   BMI 52.19 kg/m   Wt Readings from Last 3 Encounters:  01/25/22 (!) 395 lb 9.6 oz (179.4 kg)  12/23/21 (!) 404 lb 3.2 oz (183.3 kg)  10/23/21 (!) 407 lb (184.6 kg)     BP Readings from Last 3 Encounters:  01/25/22 135/76  01/20/22 120/86  12/23/21 (!) 118/92     Physical Exam- Limited  Constitutional:  Body mass index is 52.19 kg/m. , not in acute distress, normal state of mind Eyes:  EOMI, no exophthalmos Musculoskeletal: no gross deformities, strength intact in all four extremities, no gross restriction of joint movements Skin:  no rashes, no hyperemia Neurological: no tremor with outstretched hands    CMP ( most recent) CMP     Component Value Date/Time   NA 136 10/23/2021 0918   NA 141 04/23/2020 0813   K 4.7 10/23/2021 0918   CL 96 10/23/2021 0918   CO2 30 10/23/2021 0918   GLUCOSE 222 (H) 10/23/2021 0918   BUN 13 10/23/2021 0918   BUN 9  04/23/2020 0813   CREATININE 0.69 10/23/2021 0918   CALCIUM 9.4 10/23/2021 0918   PROT 6.9 10/23/2021 0918   PROT 7.1 04/23/2020 0813   ALBUMIN 4.2 10/23/2021 0918   ALBUMIN 4.7 04/23/2020 0813   AST 22 10/23/2021 0918   ALT 36 10/23/2021 0918   ALKPHOS 70 10/23/2021 0918   BILITOT 0.7 10/23/2021 0918   BILITOT 0.5 04/23/2020 0813   GFRNONAA 133 06/20/2019 1056   GFRAA 154 06/20/2019 1056     Diabetic Labs (most recent): Lab Results  Component Value Date   HGBA1C 6.9 (A) 01/25/2022   HGBA1C 11.0 (H) 10/23/2021   HGBA1C 10.7 (H) 12/10/2020   MICROALBUR 2.4 10/23/2021   MICROALBUR 11.6 01/23/2020   MICROALBUR 3.5 (H) 05/20/2017     Lipid Panel ( most recent) Lipid Panel     Component Value Date/Time   CHOL 134 10/23/2021 0918   CHOL 220 (H) 04/23/2020 0813   TRIG 237.0 (H) 10/23/2021 0918   HDL 36.80 (L) 10/23/2021 0918   HDL 41 04/23/2020 0813   CHOLHDL 4 10/23/2021 0918   VLDL 47.4 (H) 10/23/2021 0918   LDLCALC 151 (H) 04/23/2020 0813   LDLDIRECT 78.0 10/23/2021 0918   LABVLDL 28 04/23/2020 0813      Lab Results  Component Value Date   TSH 2.14 12/10/2020   TSH 1.420 06/20/2019   TSH 2.08 03/01/2018   TSH 1.600 03/03/2017   FREET4 0.82 03/01/2018           Assessment & Plan:   1) Type 2 diabetes mellitus with hyperglycemia, without long-term current use of insulin ([place])  He presents today with his logs showing at goal fasting and postprandial readings.  His POCT A1c today is 6.9%, improving drastically from last visit of 11%.  He does note a little nausea from the [facility] but it is tolerable.  He has really worked hard on his diet, has been consuming more fruits and veggies.  He denies  any hypoglycemia.  Rickey Payne has currently uncontrolled symptomatic type 2 DM since 31 years of age.   -Recent labs reviewed.  - I had a long discussion with him about the progressive nature of diabetes and the pathology behind its complications. -his  diabetes is not currently complicated but he remains at a high risk for more acute and chronic complications which include CAD, CVA, CKD, retinopathy, and neuropathy. These are all discussed in detail with him.  The following Lifestyle Medicine recommendations according to [facility]) were discussed and offered to patient and he agrees to start the journey:  A. Whole Foods, Plant-based plate comprising of fruits and vegetables, plant-based proteins, whole-grain carbohydrates was discussed in detail with the patient.   A list for source of those nutrients were also provided to the patient.  Patient will use only water or unsweetened tea for hydration. B.  The need to stay away from risky substances including alcohol, smoking; obtaining 7 to 9 hours of restorative sleep, at least 150 minutes of moderate intensity exercise weekly, the importance of healthy social connections,  and stress reduction techniques were discussed. C.  A full color page of  Calorie density of various food groups per pound showing examples of each food groups was provided to the patient.  - Nutritional counseling repeated at each appointment due to patients tendency to fall back in to old habits.  - The patient admits there is a room for improvement in their diet and drink choices. -  Suggestion is made for the patient to avoid simple carbohydrates from their diet including Cakes, Sweet Desserts / Pastries, Ice Cream, Soda (diet and regular), Sweet Tea, Candies, Chips, Cookies, Sweet Pastries, Store Bought Juices, Alcohol in Excess of 1-2 drinks a day, Artificial Sweeteners, Coffee Creamer, and "Sugar-free" Products. This will help patient to have stable blood glucose profile and potentially avoid unintended weight gain.   - I encouraged the patient to switch to unprocessed or minimally processed complex starch and increased protein intake (animal or plant source), fruits, and vegetables.   -  Patient is advised to stick to a routine mealtimes to eat 3 meals a day and avoid unnecessary snacks (to snack only to correct hypoglycemia).  - I have approached him with the following individualized plan to manage his diabetes and patient agrees:   -He will tolerate increase in Mounjaro to 12.5 mg SQ weekly (to aide in glucose management and weight loss) and will reduce his Metformin to 500 mg ER daily with breakfast (to help reduce nausea).  -he is encouraged to start monitoring glucose once daily, before breakfast and to call the clinic if he has readings less than 70 or above 300 for 3 tests in a row.  - Adjustment parameters are given to him for hypo and hyperglycemia in writing.  - Specific targets for  A1c; LDL, HDL, and Triglycerides were discussed with the patient.  2) Blood Pressure /Hypertension:  his blood pressure is controlled to target.   he is advised to continue his current medications including Losartan 100 mg p.o. daily with breakfast.  3) Lipids/Hyperlipidemia:    Review of his recent lipid panel from 10/23/21 showed controlled LDL at 78 and elevated triglycerides of 237 .  he is advised to continue Lipitor 20 mg daily at bedtime.  Side effects and precautions discussed with him.  4)  Weight/Diet:  his Body mass index is 52.19 kg/m.  -  clearly complicating his diabetes care.  he is a candidate for weight loss. I discussed with him the fact that loss of 5 - 10% of his  current body weight will have the most impact on his diabetes management.  Exercise, and detailed carbohydrates information provided  -  detailed on discharge instructions.  He has recently lost some weight, will benefit from continued weight loss.  5) Chronic Care/Health Maintenance: -he is on ACEI/ARB and Statin medications and is encouraged to initiate and continue to follow up with Ophthalmology, Dentist, Podiatrist at least yearly or according to recommendations, and advised to stay away from smoking. I  have recommended yearly flu vaccine and pneumonia vaccine at least every 5 years; moderate intensity exercise for up to 150 minutes weekly; and sleep for at least 7 hours a day.  - he is advised to maintain close follow up with [name], Nino Glow, MD for primary care needs, as well as his other providers for optimal and coordinated care.     I spent 35 minutes in the care of the patient today including review of labs from [place], Lipids, Thyroid Function, Hematology (current and previous including abstractions from other facilities); face-to-face time discussing  his blood glucose readings/logs, discussing hypoglycemia and hyperglycemia episodes and symptoms, medications doses, his options of short and long term treatment based on the latest standards of care / guidelines;  discussion about incorporating lifestyle medicine;  and documenting the encounter. Risk reduction counseling performed per USPSTF guidelines to reduce obesity and cardiovascular risk factors.     Please refer to Patient Instructions for Blood Glucose Monitoring and Insulin/Medications Dosing Guide"  in media tab for additional information. Please  also refer to " Patient Self Inventory" in the Media  tab for reviewed elements of pertinent patient history.  Rickey Payne participated in the discussions, expressed understanding, and voiced agreement with the above plans.  All questions were answered to his satisfaction. he is encouraged to contact clinic should he have any questions or concerns prior to his return visit.     Follow up plan: - Return in about 4 months (around 05/27/2022) for Diabetes F/U with A1c in office, No previsit labs, Bring meter and logs.    Rayetta Pigg, [facility] [address] [place], [place] [address] Phone: [phone] Fax: [phone]  01/25/2022, 10:09 AM

## 2022-01-26 ENCOUNTER — Other Ambulatory Visit: Payer: Self-pay

## 2022-01-27 ENCOUNTER — Other Ambulatory Visit (HOSPITAL_COMMUNITY): Payer: Self-pay

## 2022-01-27 ENCOUNTER — Telehealth: Payer: Self-pay

## 2022-01-27 NOTE — Telephone Encounter (Signed)
Wonderful. 

## 2022-01-27 NOTE — Telephone Encounter (Signed)
Patient Advocate Encounter   Prior authorization submitted to [employer] for [facility] 12.5mg /0.61ml  Submitted: 01/27/22 Key [id number]  PA status is pending

## 2022-01-27 NOTE — Telephone Encounter (Signed)
Will you let the patient know that his PA is in the works?  Thanks

## 2022-01-27 NOTE — Telephone Encounter (Signed)
Pharmacy Patient Advocate Encounter  Prior Authorization for [facility] 12.5mg /0.16ml has been approved.    KEY# [id number]  Effective dates: 01/27/22 through 01/27/23

## 2022-01-28 ENCOUNTER — Other Ambulatory Visit: Payer: Self-pay

## 2022-02-17 ENCOUNTER — Other Ambulatory Visit: Payer: Self-pay

## 2022-02-19 ENCOUNTER — Other Ambulatory Visit: Payer: Self-pay

## 2022-02-24 ENCOUNTER — Other Ambulatory Visit: Payer: Self-pay

## 2022-03-01 ENCOUNTER — Other Ambulatory Visit: Payer: Self-pay | Admitting: Nurse Practitioner

## 2022-03-01 ENCOUNTER — Encounter: Payer: Self-pay | Admitting: Nurse Practitioner

## 2022-03-01 DIAGNOSIS — R110 Nausea: Secondary | ICD-10-CM

## 2022-03-01 MED ORDER — ONDANSETRON HCL 4 MG PO TABS: 4.0000 mg | ORAL_TABLET | Freq: Two times a day (BID) | ORAL | 1 refills | Status: DC | PRN

## 2022-03-14 ENCOUNTER — Emergency Department
Admission: EM | Admit: 2022-03-14 | Discharge: 2022-03-14 | Disposition: A | Discharge status: Home / Self Care | Payer: Managed Care, Other (non HMO) | Attending: Emergency Medicine | Admitting: Emergency Medicine

## 2022-03-14 DIAGNOSIS — J45909 Unspecified asthma, uncomplicated: Secondary | ICD-10-CM | POA: Diagnosis not present

## 2022-03-14 DIAGNOSIS — J020 Streptococcal pharyngitis: Secondary | ICD-10-CM

## 2022-03-14 DIAGNOSIS — I10 Essential (primary) hypertension: Secondary | ICD-10-CM | POA: Diagnosis not present

## 2022-03-14 DIAGNOSIS — Z8616 Personal history of COVID-19: Secondary | ICD-10-CM | POA: Insufficient documentation

## 2022-03-14 DIAGNOSIS — J029 Acute pharyngitis, unspecified: Secondary | ICD-10-CM | POA: Diagnosis present

## 2022-03-14 DIAGNOSIS — E119 Type 2 diabetes mellitus without complications: Secondary | ICD-10-CM | POA: Insufficient documentation

## 2022-03-14 LAB — GROUP A STREP BY PCR: Group A Strep by PCR: DETECTED — AB

## 2022-03-14 LAB — CBG MONITORING, ED: Glucose-Capillary: 101 mg/dL — ABNORMAL HIGH (ref 70–99)

## 2022-03-14 MED ORDER — DEXAMETHASONE SODIUM PHOSPHATE 10 MG/ML IJ SOLN
10.0000 mg | Freq: Once | INTRAMUSCULAR | Status: AC
  Administered 2022-03-14: 10 mg via INTRAVENOUS
  Filled 2022-03-14: qty 1

## 2022-03-14 MED ORDER — KETOROLAC TROMETHAMINE 15 MG/ML IJ SOLN
15.0000 mg | Freq: Once | INTRAMUSCULAR | Status: AC
  Administered 2022-03-14: 15 mg via INTRAVENOUS
  Filled 2022-03-14: qty 1

## 2022-03-14 MED ORDER — AMOXICILLIN 500 MG PO TABS: 500.0000 mg | ORAL_TABLET | Freq: Two times a day (BID) | ORAL | 0 refills | Status: AC

## 2022-03-14 MED ORDER — AMOXICILLIN 500 MG PO CAPS
500.0000 mg | ORAL_CAPSULE | Freq: Once | ORAL | Status: AC
  Administered 2022-03-14: 500 mg via ORAL
  Filled 2022-03-14: qty 1

## 2022-03-14 MED ORDER — LACTATED RINGERS IV BOLUS
1000.0000 mL | Freq: Once | INTRAVENOUS | Status: AC
  Administered 2022-03-14: 1000 mL via INTRAVENOUS

## 2022-03-14 NOTE — ED Triage Notes (Signed)
Pt sts that he had his wisdom teeth removed on Thursday. Pt sts that since than he has been getting pain in his throat. Pt tonsil are very enlarged with white pocket on them. Pt also have difficulty breathing, talk and swallowing.

## 2022-03-14 NOTE — ED Provider Notes (Signed)
[facility] Provider Note    Event Date/Time   First MD Initiated Contact with Patient 03/14/22 1319     (approximate)   History   Post-op Problem   HPI  Rickey Payne is a 32 y.o. male past medical history of asthma recent wisdom tooth removal who presents with sore throat.  Patient has wisdom teeth removed last Thursday.  Day later he developed throat pain.  He has had sore throat since that time.  Has had some difficulty tolerating p.o. due to pain.  Feels like his voice sounds hoarse but denies dyspnea no fevers.  He is not having pain at the wisdom site removal.  Patient has history of strep throat but not for many years.     Past Medical History:  Diagnosis Date   Asthma    child   COVID-19    x2   Diabetes mellitus without complication ([place])    Hyperlipidemia    Hypertension    Morbid obesity ([place])    Sinus tachycardia     Patient Active Problem List   Diagnosis Date Noted   Morbid obesity with body mass index of 50 or higher ([place]) 10/26/2021   Class 3 severe obesity due to excess calories with serious comorbidity and body mass index (BMI) of 50.0 to 59.9 in adult ([place]) 10/26/2021   BMI 50.0-59.9, adult ([place]) 06/04/2021   Hypertension associated with diabetes ([place]) 07/16/2019   Elevated liver enzymes 08/04/2018   Annual physical exam 04/05/2018   Hyperlipidemia 04/05/2018   OSA (obstructive sleep apnea) 03/07/2018   HTN (hypertension) 01/25/2018   Seasonal allergies 05/20/2017   Vitamin D deficiency 05/20/2017   DM (diabetes mellitus), type 2 ([place]) 05/20/2017   Morbid obesity with BMI of 50.0-59.9, adult ([place]) 05/20/2017   Acne keloidalis nuchae 05/20/2017     Physical Exam  Triage Vital Signs: ED Triage Vitals [03/14/22 1312]  Enc Vitals Group     BP (!) 140/77     Pulse Rate (!) 104     Resp 19     Temp 99.4 F (37.4 C)     Temp Source Oral     SpO2 96 %     Weight (!) 380 lb (172.4 kg)     Height      Head  Circumference      Peak Flow      Pain Score 10     Pain Loc      Pain Edu?      Excl. in [place]?     Most recent vital signs: Vitals:   03/14/22 1500 03/14/22 1509  BP: 125/63   Pulse: 97 75  Resp:    Temp:  98.8 F (37.1 C)  SpO2: 93%      General: Awake, no distress.  CV:  Good peripheral perfusion.  Resp:  Normal effort.  Abd:  No distention.  Neuro:             Awake, Alert, Oriented x 3  Other:  Patient's voice is mildly hoarse but he is not stridulous he is tolerating secretions no increased work of breathing Significant tonsillar swelling and exudate, tonsils are kissing, however uvula is midline there is no peritonsillar asymmetry or fullness No swelling or erythema around the sites of wisdom teeth removal   ED Results / Procedures / Treatments  Labs (all labs ordered are listed, but only abnormal results are displayed) Labs Reviewed  GROUP A STREP BY PCR - Abnormal; Notable for  the following components:      Result Value   Group A Strep by PCR DETECTED (*)    All other components within normal limits  CBG MONITORING, ED - Abnormal; Notable for the following components:   Glucose-Capillary 101 (*)    All other components within normal limits     EKG     RADIOLOGY    PROCEDURES:  Critical Care performed: No  Procedures   MEDICATIONS ORDERED IN ED: Medications  dexamethasone (DECADRON) injection 10 mg (10 mg Intravenous Given 03/14/22 1347)  lactated ringers bolus 1,000 mL (0 mLs Intravenous Stopped 03/14/22 1509)  ketorolac (TORADOL) 15 MG/ML injection 15 mg (15 mg Intravenous Given 03/14/22 1348)  amoxicillin (AMOXIL) capsule 500 mg (500 mg Oral Given 03/14/22 1436)     IMPRESSION / MDM / ASSESSMENT AND PLAN / ED COURSE  I reviewed the triage vital signs and the nursing notes.                              Patient's presentation is most consistent with acute, uncomplicated illness.  Differential diagnosis includes, but is not limited to, strep  pharyngitis, other bacterial pharyngitis, exam is not consistent with peritonsillar abscess, retropharyngeal abscess less likely  Patient is a 32 year old male who recently had his wisdom teeth removed to a day later developed bilateral throat pain has had 3 days of throat pain.  No fevers.  No increased work of breathing but does feel like her voice is hoarse.  On exam patient overall looks well he is tolerating his secretions he is not stridulous breathing comfortably.  He does have kissing tonsils with exudate on exam.  However uvula is midline there is no peritonsillar asymmetry or fullness.  I do not think his exam is consistent with a peritonsillar abscess.  Suspected severe pharyngitis.  Given dose of IV Decadron and Toradol in the ED.  He did test positive for strep.  He was able to tolerate oral amoxicillin and felt improved after medications in the ED.  Blood sugar was relatively normal.  Given I am not concerned for PT I do think he is appropriate for discharge at this time.  Discharged with prescription for 10 days amoxicillin.  We discussed return precautions.   Clinical Course as of 03/14/22 1608  Sun Mar 14, 2022  1349 Group A Strep by PCR(!): DETECTED [KM]  9371 Glucose-Capillary(!): 101 [KM]    Clinical Course User Index [KM] Rada Hay, MD     FINAL CLINICAL IMPRESSION(S) / ED DIAGNOSES   Final diagnoses:  Strep pharyngitis     Rx / DC Orders   ED Discharge Orders          Ordered    amoxicillin (AMOXIL) 500 MG tablet  2 times daily        03/14/22 1456             Note:  This document was prepared using Dragon voice recognition software and may include unintentional dictation errors.   Rada Hay, MD 03/14/22 [phone]

## 2022-03-14 NOTE — Discharge Instructions (Addendum)
Please take the antibiotic twice a day for the next 10 days.  You did test positive for strep throat.  You can take the hydrocodone that you are taking for your wisdom teeth in addition to Tylenol Motrin for pain.  If you are having increasing swelling or difficulty swallowing please return to the emergency department.

## 2022-03-14 NOTE — ED Notes (Signed)
EDP at bedside. Pt has muffled voice. Cannot swallow secretions. Had wisdom teeth out Thursday (3d ago), started feeling throat swelling next day (2d ago). Inside of throat is very swollen upon visual exam. Pt has hx recurrent strep infections. Swabbed for strep in triage. 20G inserted, basic labs and grey top sent.

## 2022-03-14 NOTE — ED Notes (Signed)
Pt swallowed amox capsule and water. CBG 101.

## 2022-03-14 NOTE — ED Notes (Signed)
Pt states feels a little better. Tonsils not as close together/more space between them upon visualization. Pt swallowing secretions. Pt states probably not able yet to swallow amox capsule.

## 2022-03-24 ENCOUNTER — Other Ambulatory Visit: Payer: Self-pay

## 2022-03-24 ENCOUNTER — Other Ambulatory Visit (HOSPITAL_BASED_OUTPATIENT_CLINIC_OR_DEPARTMENT_OTHER): Payer: Self-pay

## 2022-03-29 ENCOUNTER — Other Ambulatory Visit (HOSPITAL_BASED_OUTPATIENT_CLINIC_OR_DEPARTMENT_OTHER): Payer: Self-pay

## 2022-04-20 ENCOUNTER — Other Ambulatory Visit: Payer: Self-pay

## 2022-04-20 LAB — HM DIABETES EYE EXAM

## 2022-04-28 ENCOUNTER — Ambulatory Visit (INDEPENDENT_AMBULATORY_CARE_PROVIDER_SITE_OTHER): Payer: Managed Care, Other (non HMO) | Admitting: Family Medicine

## 2022-04-28 ENCOUNTER — Encounter: Payer: Self-pay | Admitting: Family Medicine

## 2022-04-28 VITALS — BP 138/70 | HR 68 | Temp 97.7°F | Ht 73.0 in | Wt 387.2 lb

## 2022-04-28 DIAGNOSIS — I10 Essential (primary) hypertension: Secondary | ICD-10-CM

## 2022-04-28 DIAGNOSIS — E782 Mixed hyperlipidemia: Secondary | ICD-10-CM

## 2022-04-28 DIAGNOSIS — I152 Hypertension secondary to endocrine disorders: Secondary | ICD-10-CM

## 2022-04-28 DIAGNOSIS — Z114 Encounter for screening for human immunodeficiency virus [HIV]: Secondary | ICD-10-CM

## 2022-04-28 DIAGNOSIS — E559 Vitamin D deficiency, unspecified: Secondary | ICD-10-CM | POA: Diagnosis not present

## 2022-04-28 DIAGNOSIS — E1169 Type 2 diabetes mellitus with other specified complication: Secondary | ICD-10-CM

## 2022-04-28 DIAGNOSIS — E119 Type 2 diabetes mellitus without complications: Secondary | ICD-10-CM

## 2022-04-28 DIAGNOSIS — E1159 Type 2 diabetes mellitus with other circulatory complications: Secondary | ICD-10-CM | POA: Diagnosis not present

## 2022-04-28 DIAGNOSIS — Z6841 Body Mass Index (BMI) 40.0 and over, adult: Secondary | ICD-10-CM

## 2022-04-28 MED ORDER — HYDROCHLOROTHIAZIDE 25 MG PO TABS: ORAL_TABLET | ORAL | 1 refills | Status: DC

## 2022-04-28 NOTE — Progress Notes (Addendum)
SUBJECTIVE:   Chief Complaint  Patient presents with   Transitions Of Care   HPI Patient presents to clinic to transfer care  Hypertension [place].  Taking Cozaar 100 mg daily, hydrochlorothiazide 12.5 mg daily and tolerating medication well.  At home blood pressure remains greater than 130/80.  Denies any headaches, visual changes, chest pain or shortness of breath.  No abdominal pain or lower extremity edema.   Diabetes type II Follows with endocrinology.  Currently on Mounjaro 12.5 mg weekly, metformin 1000 mg daily.  A1c is decreased from 11% to 6.9%.  Weight has decreased approximately 450 now down to 387 pounds.  He works out 4-5 times a week.  Portion control with food.  On statin and ARB.  Hyperlipidemia Takes Lipitor 20 mg daily.  Tolerating well.  No myalgias.   PERTINENT PMH / PSH: Hypertension Diabetes OSA Obesity  OBJECTIVE:  BP 138/70   Pulse 68   Temp 97.7 F (36.5 C) (Oral)   Ht 6\' 1"  (1.854 m)   Wt (!) 387 lb 3.2 oz (175.6 kg)   SpO2 96%   BMI 51.08 kg/m    Physical Exam Constitutional:      General: He is not in acute distress.    Appearance: He is obese. He is not ill-appearing.  HENT:     Head: Normocephalic.  Eyes:     Conjunctiva/sclera: Conjunctivae normal.  Neck:     Thyroid: No thyromegaly or thyroid tenderness.  Cardiovascular:     Rate and Rhythm: Normal rate and regular rhythm.     Pulses: Normal pulses.  Pulmonary:     Effort: Pulmonary effort is normal.     Breath sounds: Normal breath sounds.  Abdominal:     General: Bowel sounds are normal.  Neurological:     Mental Status: He is alert. Mental status is at baseline.  Psychiatric:        Mood and Affect: Mood normal.        Behavior: Behavior normal.        Thought Content: Thought content normal.        Judgment: Judgment normal.     ASSESSMENT/PLAN:  Hypertension associated with diabetes ([place]) Assessment & Plan: Chronic.  Not at goal less than 130/80. Compliant  with medication. Increase hydrochlorothiazide from 12.5 to 25 mg Continue Cozaar 100 mg daily Monitor blood pressure at home, goal less than 130/80. Follow-up PCP in 6 months.  Orders: -     Comprehensive metabolic panel; Future -     TSH; Future  Vitamin D deficiency Assessment & Plan: Chronic.  Currently on supplements. Check vitamin D levels  Orders: -     VITAMIN D 25 Hydroxy (Vit-D Deficiency, Fractures); Future  Mixed hyperlipidemia Assessment & Plan: Chronic. On statin therapy and tolerating well.  No myalgias. Continue Lipitor 20 mg daily Repeat fasting lipids at next visit  Orders: -     Lipid panel; Future  Diabetic eye exam ([place])  Type 2 diabetes mellitus with other specified complication, without long-term current use of insulin ([place]) Assessment & Plan: Chronic.  Stable.  Recent A1c 6.9.  Tolerating medication well. Managed by endocrinology. Currently on metformin 500 mg daily and tirzepatide 12.5 mg weekly. Recommend yearly eye exam Recommend yearly foot exam On A statin and ARB therapy Follow-up with endocrinology as scheduled.   Orders: -     Vitamin B12; Future -     CBC with Differential/Platelet; Future  Encounter for screening for HIV -  HIV Antibody (routine testing w rflx); Future  Essential hypertension -     hydroCHLOROthiazide; TAKE 1 TABLET(12.5 MG) BY MOUTH DAILY IN THE MORNING  Dispense: 90 tablet; Refill: 1  Class 3 severe obesity due to excess calories with serious comorbidity and body mass index (BMI) of 50.0 to 59.9 in adult [facility]) Assessment & Plan: BMI remains elevated but has decreased.  Initial weight 415 now 387 pounds. Currently on tirzepatide 12.5 mg weekly.  Managed by endocrinology. Works out 3-5 times weekly Portion control with meals Encouraged to continue current management.   HCM Recommend pneumonia 20 vaccine.  Declined today.  PDMP reviewed  Return in about 6 months (around 10/29/2022) for PCP, annual  visit with fasting labs 1 week prior.  Carollee Leitz, MD

## 2022-04-28 NOTE — Patient Instructions (Addendum)
It was a pleasure meeting you today. Thank you for allowing me to take part in your health care.  Our goals for today as we discussed include:  Schedule annual physical appointment for September Schedule fasting lab work 1 week prior to office visit.  Recommend pneumonia 20 vaccine  Increase hydrochlorothiazide to 25 mg daily. If do not receive medication can take two of your current 12.5 mg tablets   If you have any questions or concerns, please do not hesitate to call the office at [phone].  I look forward to our next visit and until then take care and stay safe.  Regards,   Carollee Leitz, MD   [facility]

## 2022-05-01 ENCOUNTER — Encounter: Payer: Self-pay | Admitting: Family Medicine

## 2022-05-01 NOTE — Assessment & Plan Note (Addendum)
Chronic. On statin therapy and tolerating well.  No myalgias. Continue Lipitor 20 mg daily Repeat fasting lipids at next visit

## 2022-05-01 NOTE — Assessment & Plan Note (Signed)
BMI remains elevated but has decreased.  Initial weight 415 now 387 pounds. Currently on tirzepatide 12.5 mg weekly.  Managed by endocrinology. Works out 3-5 times weekly Portion control with meals Encouraged to continue current management.

## 2022-05-01 NOTE — Assessment & Plan Note (Signed)
Chronic.  Stable.  Recent A1c 6.9.  Tolerating medication well. Managed by endocrinology. Currently on metformin 500 mg daily and tirzepatide 12.5 mg weekly. Recommend yearly eye exam Recommend yearly foot exam On A statin and ARB therapy Follow-up with endocrinology as scheduled.

## 2022-05-01 NOTE — Assessment & Plan Note (Signed)
Chronic.  Not at goal less than 130/80. Compliant with medication. Increase hydrochlorothiazide from 12.5 to 25 mg Continue Cozaar 100 mg daily Monitor blood pressure at home, goal less than 130/80. Follow-up PCP in 6 months.

## 2022-05-01 NOTE — Assessment & Plan Note (Signed)
Chronic.  Currently on supplements. Check vitamin D levels

## 2022-05-05 ENCOUNTER — Encounter: Payer: Self-pay | Admitting: Family Medicine

## 2022-05-21 ENCOUNTER — Other Ambulatory Visit: Payer: Self-pay

## 2022-05-31 ENCOUNTER — Ambulatory Visit (INDEPENDENT_AMBULATORY_CARE_PROVIDER_SITE_OTHER): Payer: Managed Care, Other (non HMO) | Admitting: Nurse Practitioner

## 2022-05-31 ENCOUNTER — Encounter: Payer: Self-pay | Admitting: Nurse Practitioner

## 2022-05-31 VITALS — BP 130/88 | HR 74 | Ht 73.0 in | Wt 393.0 lb

## 2022-05-31 DIAGNOSIS — I152 Hypertension secondary to endocrine disorders: Secondary | ICD-10-CM

## 2022-05-31 DIAGNOSIS — E785 Hyperlipidemia, unspecified: Secondary | ICD-10-CM

## 2022-05-31 DIAGNOSIS — E559 Vitamin D deficiency, unspecified: Secondary | ICD-10-CM

## 2022-05-31 DIAGNOSIS — E1165 Type 2 diabetes mellitus with hyperglycemia: Secondary | ICD-10-CM | POA: Diagnosis not present

## 2022-05-31 DIAGNOSIS — E1159 Type 2 diabetes mellitus with other circulatory complications: Secondary | ICD-10-CM

## 2022-05-31 LAB — POCT GLYCOSYLATED HEMOGLOBIN (HGB A1C): Hemoglobin A1C: 6.7 % — AB (ref 4.0–5.6)

## 2022-05-31 NOTE — Patient Instructions (Signed)

## 2022-05-31 NOTE — Progress Notes (Signed)
Endocrinology Follow Up Note       05/31/2022, 9:29 AM   Subjective:    Patient ID: Rickey Payne, male    DOB: 04/02/1990.  Rickey Payne is being seen in follow up after being seen in consultation for management of currently uncontrolled symptomatic diabetes requested by  Dana Allan, MD.   Past Medical History:  Diagnosis Date   Asthma    child   COVID-19    x2   Diabetes mellitus without complication    Hyperlipidemia    Hypertension    Morbid obesity    Sinus tachycardia     Past Surgical History:  Procedure Laterality Date   right wrist fracture      Social History   Socioeconomic History   Marital status: Married    Spouse name: Not on file   Number of children: Not on file   Years of education: Not on file   Highest education level: Not on file  Occupational History   Not on file  Tobacco Use   Smoking status: Never   Smokeless tobacco: Never  Substance and Sexual Activity   Alcohol use: Yes    Alcohol/week: 1.0 standard drink of alcohol    Types: 1 Cans of beer per week    Comment: occasional   Drug use: No   Sexual activity: Yes  Other Topics Concern   Not on file  Social History Narrative   Diploma, deputy detention officer    Married    2 kids 7 y.o boy, 6 month boy, wife pregnant as of 06/20/19    -as of 01/23/20 pt has 3 boys       Drinks occasionally    Never smoker, no chew    Owns guns, wears seat belt, safe in relationship       Social Determinants of Health   Financial Resource Strain: Medium Risk (10/15/2020)   Overall Financial Resource Strain (CARDIA)    Difficulty of Paying Living Expenses: Somewhat hard  Food Insecurity: Not on file  Transportation Needs: Not on file  Physical Activity: Not on file  Stress: Not on file  Social Connections: Not on file    Family History  Problem Relation Age of Onset   Hypertension Mother    Arthritis Mother     Asthma Mother    Depression Mother    Hyperlipidemia Mother    Diabetes Mother    Hypertension Father    Depression Father    Hyperlipidemia Father    Intellectual disability Father    Stroke Father    Hepatitis C Father    Kidney failure Father        s/p transplant    Heart disease Maternal Grandfather    Diabetes Paternal Grandmother    Drug abuse Paternal Grandmother    Diabetes Maternal Grandmother     Outpatient Encounter Medications as of 05/31/2022  Medication Sig   atorvastatin (LIPITOR) 20 MG tablet TAKE 1 TABLET(20 MG) BY MOUTH DAILY   Blood Glucose Monitoring Suppl (ONETOUCH VERIO FLEX SYSTEM) w/Device KIT Use to monitor glucose twice daily   Cholecalciferol 1.25 MG (50000 UT) capsule Take 1 capsule (50,000 Units total) by mouth  once a week. D3 d/c after 6 months   fluticasone (FLONASE) 50 MCG/ACT nasal spray Place 1 spray into both nostrils daily as needed for allergies or rhinitis.   glucose blood (ONETOUCH VERIO) test strip Use as instructed   hydrochlorothiazide (HYDRODIURIL) 25 MG tablet TAKE 1 TABLET(12.5 MG) BY MOUTH DAILY IN THE MORNING   Lancets (ONETOUCH DELICA PLUS LANCET33G) MISC USE THREE TIMES DAILY AS DIRECTED   losartan (COZAAR) 100 MG tablet TAKE 1 TABLET(100 MG) BY MOUTH DAILY   metFORMIN (GLUCOPHAGE-XR) 500 MG 24 hr tablet Take 1 tablet (500 mg total) by mouth daily with breakfast. TAKE 2 TABLETS(1000 MG) BY MOUTH TWICE DAILY WITH A MEAL   Multiple Vitamin (MULTIVITAMIN PO) Take by mouth.   ondansetron (ZOFRAN) 4 MG tablet Take 1 tablet (4 mg total) by mouth 2 (two) times daily as needed.   tirzepatide (MOUNJARO) 12.5 MG/0.5ML Pen Inject 12.5 mg into the skin once a week.   No facility-administered encounter medications on file as of 05/31/2022.    ALLERGIES: No Known Allergies  VACCINATION STATUS: Immunization History  Administered Date(s) Administered   Hepb-cpg 04/05/2018, 05/10/2018   Influenza-Unspecified 06/08/2020   MMR 04/19/2018    Moderna SARS-COV2 Booster Vaccination 06/08/2020   Moderna Sars-Covid-2 Vaccination 07/08/2019, 07/22/2019   Tdap 01/23/2020    Diabetes He presents for his follow-up diabetic visit. He has type 2 diabetes mellitus. Onset time: Diagnosed at approx age of 30. His disease course has been improving. There are no hypoglycemic associated symptoms. Pertinent negatives for diabetes include no weight loss. There are no hypoglycemic complications. There are no diabetic complications. Risk factors for coronary artery disease include diabetes mellitus, obesity, male sex, hypertension, family history and dyslipidemia. Current diabetic treatment includes diet and oral agent (monotherapy) (and Mounjaro). He is compliant with treatment most of the time. His weight is decreasing steadily. He is following a generally healthy diet. When asked about meal planning, he reported none. He has not had a previous visit with a dietitian. He participates in exercise daily. His home blood glucose trend is decreasing steadily. His breakfast blood glucose range is generally 90-110 mg/dl. His overall blood glucose range is 140-180 mg/dl. (He presents today with his logs showing at goal fasting readings.  His POCT A1c today is 6.1%, improving from last visit of 6.9%.  He notes some constipation and nausea with his Greggory Keen but is working to incorporate more fibrous foods to his daily intake.  ) An ACE inhibitor/angiotensin II receptor blocker is being taken. He does not see a podiatrist.Eye exam is current.     Review of systems  Constitutional: +increasing body weight, current Body mass index is 51.85 kg/m., no fatigue, no subjective hyperthermia, no subjective hypothermia Eyes: no blurry vision, no xerophthalmia ENT: no sore throat, no nodules palpated in throat, no dysphagia/odynophagia, no hoarseness Cardiovascular: no chest pain, no shortness of breath, no palpitations, no leg swelling Respiratory: no cough, no shortness of  breath Gastrointestinal: no nausea/vomiting/diarrhea Musculoskeletal: no muscle/joint aches Skin: no rashes, no hyperemia Neurological: no tremors, no numbness, no tingling, no dizziness Psychiatric: no depression, no anxiety  Objective:     BP 130/88 (BP Location: Left Arm, Patient Position: Sitting, Cuff Size: Large)   Pulse 74   Ht 6\' 1"  (1.854 m)   Wt (!) 393 lb (178.3 kg)   BMI 51.85 kg/m   Wt Readings from Last 3 Encounters:  05/31/22 (!) 393 lb (178.3 kg)  04/28/22 (!) 387 lb 3.2 oz (175.6 kg)  03/14/22 Marland Kitchen)  380 lb (172.4 kg)     BP Readings from Last 3 Encounters:  05/31/22 130/88  04/28/22 138/70  03/14/22 125/63     Physical Exam- Limited  Constitutional:  Body mass index is 51.85 kg/m. , not in acute distress, normal state of mind Eyes:  EOMI, no exophthalmos Musculoskeletal: no gross deformities, strength intact in all four extremities, no gross restriction of joint movements Skin:  no rashes, no hyperemia Neurological: no tremor with outstretched hands   Diabetic Foot Exam - Simple   No data filed     CMP ( most recent) CMP     Component Value Date/Time   NA 136 10/23/2021 0918   NA 141 04/23/2020 0813   K 4.7 10/23/2021 0918   CL 96 10/23/2021 0918   CO2 30 10/23/2021 0918   GLUCOSE 222 (H) 10/23/2021 0918   BUN 13 10/23/2021 0918   BUN 9 04/23/2020 0813   CREATININE 0.69 10/23/2021 0918   CALCIUM 9.4 10/23/2021 0918   PROT 6.9 10/23/2021 0918   PROT 7.1 04/23/2020 0813   ALBUMIN 4.2 10/23/2021 0918   ALBUMIN 4.7 04/23/2020 0813   AST 22 10/23/2021 0918   ALT 36 10/23/2021 0918   ALKPHOS 70 10/23/2021 0918   BILITOT 0.7 10/23/2021 0918   BILITOT 0.5 04/23/2020 0813   GFRNONAA 133 06/20/2019 1056   GFRAA 154 06/20/2019 1056     Diabetic Labs (most recent): Lab Results  Component Value Date   HGBA1C 6.7 (A) 05/31/2022   HGBA1C 6.9 (A) 01/25/2022   HGBA1C 11.0 (H) 10/23/2021   MICROALBUR 2.4 10/23/2021   MICROALBUR 11.6  01/23/2020   MICROALBUR 3.5 (H) 05/20/2017     Lipid Panel ( most recent) Lipid Panel     Component Value Date/Time   CHOL 134 10/23/2021 0918   CHOL 220 (H) 04/23/2020 0813   TRIG 237.0 (H) 10/23/2021 0918   HDL 36.80 (L) 10/23/2021 0918   HDL 41 04/23/2020 0813   CHOLHDL 4 10/23/2021 0918   VLDL 47.4 (H) 10/23/2021 0918   LDLCALC 151 (H) 04/23/2020 0813   LDLDIRECT 78.0 10/23/2021 0918   LABVLDL 28 04/23/2020 0813      Lab Results  Component Value Date   TSH 2.14 12/10/2020   TSH 1.420 06/20/2019   TSH 2.08 03/01/2018   TSH 1.600 03/03/2017   FREET4 0.82 03/01/2018           Assessment & Plan:   1) Type 2 diabetes mellitus with hyperglycemia, without long-term current use of insulin (HCC)  He presents today with his logs showing at goal fasting readings.  His POCT A1c today is 6.1%, improving from last visit of 6.9%.  He notes some constipation and nausea with his Greggory Keen but is working to incorporate more fibrous foods to his daily intake.    Rickey Payne has currently uncontrolled symptomatic type 2 DM since 32 years of age.   -Recent labs reviewed.  - I had a long discussion with him about the progressive nature of diabetes and the pathology behind its complications. -his diabetes is not currently complicated but he remains at a high risk for more acute and chronic complications which include CAD, CVA, CKD, retinopathy, and neuropathy. These are all discussed in detail with him.  The following Lifestyle Medicine recommendations according to American College of Lifestyle Medicine [facility]) were discussed and offered to patient and he agrees to start the journey:  A. Whole Foods, Plant-based plate comprising of fruits and vegetables, plant-based proteins,  whole-grain carbohydrates was discussed in detail with the patient.   A list for source of those nutrients were also provided to the patient.  Patient will use only water or unsweetened tea for hydration. B.   The need to stay away from risky substances including alcohol, smoking; obtaining 7 to 9 hours of restorative sleep, at least 150 minutes of moderate intensity exercise weekly, the importance of healthy social connections,  and stress reduction techniques were discussed. C.  A full color page of  Calorie density of various food groups per pound showing examples of each food groups was provided to the patient.  - Nutritional counseling repeated at each appointment due to patients tendency to fall back in to old habits.  - The patient admits there is a room for improvement in their diet and drink choices. -  Suggestion is made for the patient to avoid simple carbohydrates from their diet including Cakes, Sweet Desserts / Pastries, Ice Cream, Soda (diet and regular), Sweet Tea, Candies, Chips, Cookies, Sweet Pastries, Store Bought Juices, Alcohol in Excess of 1-2 drinks a day, Artificial Sweeteners, Coffee Creamer, and "Sugar-free" Products. This will help patient to have stable blood glucose profile and potentially avoid unintended weight gain.   - I encouraged the patient to switch to unprocessed or minimally processed complex starch and increased protein intake (animal or plant source), fruits, and vegetables.   - Patient is advised to stick to a routine mealtimes to eat 3 meals a day and avoid unnecessary snacks (to snack only to correct hypoglycemia).  - I have approached him with the following individualized plan to manage his diabetes and patient agrees:   -He is advised to continue Mounjaro 12.5 mg SQ weekly (will hold off on advancing due to shortage on higher doses) and continue Metformin 500 mg ER daily with breakfast.  -he is encouraged to start monitoring glucose once daily, before breakfast and to call the clinic if he has readings less than 70 or above 300 for 3 tests in a row.  - Adjustment parameters are given to him for hypo and hyperglycemia in writing.  - Specific targets for   A1c; LDL, HDL, and Triglycerides were discussed with the patient.  2) Blood Pressure /Hypertension:  his blood pressure is controlled to target.   he is advised to continue his current medications including Losartan 100 mg p.o. daily with breakfast.  3) Lipids/Hyperlipidemia:    Review of his recent lipid panel from 10/23/21 showed controlled LDL at 78 and elevated triglycerides of 237 .  he is advised to continue Lipitor 20 mg daily at bedtime.  Side effects and precautions discussed with him.  4)  Weight/Diet:  his Body mass index is 51.85 kg/m.  -  clearly complicating his diabetes care.   he is a candidate for weight loss. I discussed with him the fact that loss of 5 - 10% of his  current body weight will have the most impact on his diabetes management.  Exercise, and detailed carbohydrates information provided  -  detailed on discharge instructions.  He has recently lost some weight, will benefit from continued weight loss.  5) Chronic Care/Health Maintenance: -he is on ACEI/ARB and Statin medications and is encouraged to initiate and continue to follow up with Ophthalmology, Dentist, Podiatrist at least yearly or according to recommendations, and advised to stay away from smoking. I have recommended yearly flu vaccine and pneumonia vaccine at least every 5 years; moderate intensity exercise for up to 150 minutes  weekly; and sleep for at least 7 hours a day.  - he is advised to maintain close follow up with Dana Allan, MD for primary care needs, as well as his other providers for optimal and coordinated care.      I spent  46  minutes in the care of the patient today including review of labs from CMP, Lipids, Thyroid Function, Hematology (current and previous including abstractions from other facilities); face-to-face time discussing  his blood glucose readings/logs, discussing hypoglycemia and hyperglycemia episodes and symptoms, medications doses, his options of short and long term  treatment based on the latest standards of care / guidelines;  discussion about incorporating lifestyle medicine;  and documenting the encounter. Risk reduction counseling performed per USPSTF guidelines to reduce obesity and cardiovascular risk factors.     Please refer to Patient Instructions for Blood Glucose Monitoring and Insulin/Medications Dosing Guide"  in media tab for additional information. Please  also refer to " Patient Self Inventory" in the Media  tab for reviewed elements of pertinent patient history.  Rickey Payne participated in the discussions, expressed understanding, and voiced agreement with the above plans.  All questions were answered to his satisfaction. he is encouraged to contact clinic should he have any questions or concerns prior to his return visit.     Follow up plan: - Return in about 4 months (around 09/30/2022) for Diabetes F/U with A1c in office, No previsit labs.    Ronny Bacon, [facility] [address] [place], [place] [address] Phone: [phone] Fax: [phone]  05/31/2022, 9:29 AM

## 2022-06-17 ENCOUNTER — Other Ambulatory Visit: Payer: Self-pay

## 2022-07-19 ENCOUNTER — Other Ambulatory Visit: Payer: Self-pay | Admitting: Nurse Practitioner

## 2022-07-19 ENCOUNTER — Other Ambulatory Visit: Payer: Self-pay

## 2022-07-20 ENCOUNTER — Other Ambulatory Visit: Payer: Self-pay

## 2022-07-20 MED FILL — Tirzepatide Soln Auto-injector 12.5 MG/0.5ML: SUBCUTANEOUS | 28 days supply | Qty: 2 | Fill #0 | Status: AC

## 2022-08-10 ENCOUNTER — Other Ambulatory Visit: Payer: Self-pay

## 2022-08-10 MED FILL — Tirzepatide Soln Auto-injector 12.5 MG/0.5ML: SUBCUTANEOUS | 28 days supply | Qty: 2 | Fill #1 | Status: CN

## 2022-08-18 ENCOUNTER — Other Ambulatory Visit: Payer: Self-pay

## 2022-08-18 MED FILL — Tirzepatide Soln Auto-injector 12.5 MG/0.5ML: SUBCUTANEOUS | 28 days supply | Qty: 2 | Fill #1 | Status: AC

## 2022-09-13 ENCOUNTER — Other Ambulatory Visit: Payer: Self-pay

## 2022-09-13 MED FILL — Tirzepatide Soln Auto-injector 12.5 MG/0.5ML: SUBCUTANEOUS | 28 days supply | Qty: 2 | Fill #2 | Status: AC

## 2022-09-23 ENCOUNTER — Encounter (INDEPENDENT_AMBULATORY_CARE_PROVIDER_SITE_OTHER): Payer: Self-pay

## 2022-10-01 ENCOUNTER — Ambulatory Visit: Payer: Managed Care, Other (non HMO) | Admitting: Nurse Practitioner

## 2022-10-01 DIAGNOSIS — E1165 Type 2 diabetes mellitus with hyperglycemia: Secondary | ICD-10-CM

## 2022-10-01 DIAGNOSIS — E785 Hyperlipidemia, unspecified: Secondary | ICD-10-CM

## 2022-10-01 DIAGNOSIS — E559 Vitamin D deficiency, unspecified: Secondary | ICD-10-CM

## 2022-10-01 DIAGNOSIS — Z7984 Long term (current) use of oral hypoglycemic drugs: Secondary | ICD-10-CM

## 2022-10-01 DIAGNOSIS — Z7985 Long-term (current) use of injectable non-insulin antidiabetic drugs: Secondary | ICD-10-CM

## 2022-10-01 DIAGNOSIS — I152 Hypertension secondary to endocrine disorders: Secondary | ICD-10-CM

## 2022-10-05 ENCOUNTER — Other Ambulatory Visit (HOSPITAL_BASED_OUTPATIENT_CLINIC_OR_DEPARTMENT_OTHER): Payer: Self-pay

## 2022-10-07 ENCOUNTER — Other Ambulatory Visit: Payer: Self-pay

## 2022-10-07 MED FILL — Tirzepatide Soln Auto-injector 12.5 MG/0.5ML: SUBCUTANEOUS | 28 days supply | Qty: 2 | Fill #3 | Status: AC

## 2022-10-21 ENCOUNTER — Encounter: Payer: Self-pay | Admitting: Nurse Practitioner

## 2022-10-21 ENCOUNTER — Other Ambulatory Visit: Payer: Self-pay

## 2022-10-21 ENCOUNTER — Ambulatory Visit (INDEPENDENT_AMBULATORY_CARE_PROVIDER_SITE_OTHER): Payer: Managed Care, Other (non HMO) | Admitting: Nurse Practitioner

## 2022-10-21 VITALS — BP 136/95 | HR 77 | Ht 73.0 in | Wt >= 6400 oz

## 2022-10-21 DIAGNOSIS — Z7985 Long-term (current) use of injectable non-insulin antidiabetic drugs: Secondary | ICD-10-CM | POA: Diagnosis not present

## 2022-10-21 DIAGNOSIS — E1165 Type 2 diabetes mellitus with hyperglycemia: Secondary | ICD-10-CM | POA: Diagnosis not present

## 2022-10-21 DIAGNOSIS — I10 Essential (primary) hypertension: Secondary | ICD-10-CM

## 2022-10-21 DIAGNOSIS — I152 Hypertension secondary to endocrine disorders: Secondary | ICD-10-CM

## 2022-10-21 DIAGNOSIS — E559 Vitamin D deficiency, unspecified: Secondary | ICD-10-CM

## 2022-10-21 DIAGNOSIS — E1159 Type 2 diabetes mellitus with other circulatory complications: Secondary | ICD-10-CM

## 2022-10-21 DIAGNOSIS — E785 Hyperlipidemia, unspecified: Secondary | ICD-10-CM

## 2022-10-21 DIAGNOSIS — Z7984 Long term (current) use of oral hypoglycemic drugs: Secondary | ICD-10-CM

## 2022-10-21 LAB — POCT GLYCOSYLATED HEMOGLOBIN (HGB A1C): Hemoglobin A1C: 6.2 % — AB (ref 4.0–5.6)

## 2022-10-21 MED ORDER — METFORMIN HCL ER 500 MG PO TB24: 500.0000 mg | ORAL_TABLET | Freq: Every day | ORAL | Status: DC

## 2022-10-21 MED ORDER — TIRZEPATIDE 15 MG/0.5ML ~~LOC~~ SOAJ
15.0000 mg | SUBCUTANEOUS | 3 refills | Status: DC
  Filled 2022-10-21: qty 6, 84d supply, fill #0
  Filled 2022-11-08: qty 2, 28d supply, fill #0
  Filled 2022-12-07: qty 2, 28d supply, fill #1
  Filled 2022-12-31: qty 2, 28d supply, fill #2
  Filled 2023-01-27: qty 2, 28d supply, fill #3
  Filled 2023-03-01: qty 2, 28d supply, fill #4
  Filled 2023-03-28: qty 2, 28d supply, fill #5
  Filled 2023-04-20: qty 2, 28d supply, fill #6
  Filled 2023-05-24 – 2023-05-30 (×3): qty 2, 28d supply, fill #7
  Filled 2023-06-27: qty 2, 28d supply, fill #8

## 2022-10-21 NOTE — Progress Notes (Signed)
Endocrinology Follow Up Note       10/21/2022, 2:02 PM   Subjective:    Patient ID: Rickey Payne, male    DOB: Mar 06, 1990.  Rickey Payne is being seen in follow up after being seen in consultation for management of currently uncontrolled symptomatic diabetes requested by  Dana Allan, MD.   Past Medical History:  Diagnosis Date   Asthma    child   COVID-19    x2   Diabetes mellitus without complication (HCC)    Hyperlipidemia    Hypertension    Morbid obesity (HCC)    Sinus tachycardia     Past Surgical History:  Procedure Laterality Date   right wrist fracture      Social History   Socioeconomic History   Marital status: Married    Spouse name: Not on file   Number of children: Not on file   Years of education: Not on file   Highest education level: Not on file  Occupational History   Not on file  Tobacco Use   Smoking status: Never   Smokeless tobacco: Never  Substance and Sexual Activity   Alcohol use: Yes    Alcohol/week: 1.0 standard drink of alcohol    Types: 1 Cans of beer per week    Comment: occasional   Drug use: No   Sexual activity: Yes  Other Topics Concern   Not on file  Social History Narrative   Diploma, deputy detention officer    Married    2 kids 7 y.o boy, 6 month boy, wife pregnant as of 06/20/19    -as of 01/23/20 pt has 3 boys       Drinks occasionally    Never smoker, no chew    Owns guns, wears seat belt, safe in relationship       Social Determinants of Health   Financial Resource Strain: Medium Risk (10/15/2020)   Overall Financial Resource Strain (CARDIA)    Difficulty of Paying Living Expenses: Somewhat hard  Food Insecurity: Not on file  Transportation Needs: Not on file  Physical Activity: Not on file  Stress: Not on file  Social Connections: Not on file    Family History  Problem Relation Age of Onset   Hypertension Mother    Arthritis  Mother    Asthma Mother    Depression Mother    Hyperlipidemia Mother    Diabetes Mother    Hypertension Father    Depression Father    Hyperlipidemia Father    Intellectual disability Father    Stroke Father    Hepatitis C Father    Kidney failure Father        s/p transplant    Heart disease Maternal Grandfather    Diabetes Paternal Grandmother    Drug abuse Paternal Grandmother    Diabetes Maternal Grandmother     Outpatient Encounter Medications as of 10/21/2022  Medication Sig   atorvastatin (LIPITOR) 20 MG tablet TAKE 1 TABLET(20 MG) BY MOUTH DAILY   Blood Glucose Monitoring Suppl (ONETOUCH VERIO FLEX SYSTEM) w/Device KIT Use to monitor glucose twice daily   Cholecalciferol 1.25 MG (50000 UT) capsule Take 1 capsule (50,000 Units total)  by mouth once a week. D3 d/c after 6 months   fluticasone (FLONASE) 50 MCG/ACT nasal spray Place 1 spray into both nostrils daily as needed for allergies or rhinitis.   glucose blood (ONETOUCH VERIO) test strip Use as instructed   hydrochlorothiazide (HYDRODIURIL) 25 MG tablet TAKE 1 TABLET(12.5 MG) BY MOUTH DAILY IN THE MORNING   Lancets (ONETOUCH DELICA PLUS LANCET33G) MISC USE THREE TIMES DAILY AS DIRECTED   losartan (COZAAR) 100 MG tablet TAKE 1 TABLET(100 MG) BY MOUTH DAILY   Multiple Vitamin (MULTIVITAMIN PO) Take by mouth.   ondansetron (ZOFRAN) 4 MG tablet Take 1 tablet (4 mg total) by mouth 2 (two) times daily as needed.   tirzepatide (MOUNJARO) 15 MG/0.5ML Pen Inject 15 mg into the skin once a week.   [DISCONTINUED] metFORMIN (GLUCOPHAGE-XR) 500 MG 24 hr tablet Take 1 tablet (500 mg total) by mouth daily with breakfast. TAKE 2 TABLETS(1000 MG) BY MOUTH TWICE DAILY WITH A MEAL   [DISCONTINUED] tirzepatide (MOUNJARO) 12.5 MG/0.5ML Pen Inject 12.5 mg into the skin once a week.   metFORMIN (GLUCOPHAGE-XR) 500 MG 24 hr tablet Take 1 tablet (500 mg total) by mouth daily with breakfast. TAKE 2 TABLETS(1000 MG) BY MOUTH TWICE DAILY WITH A MEAL    No facility-administered encounter medications on file as of 10/21/2022.    ALLERGIES: No Known Allergies  VACCINATION STATUS: Immunization History  Administered Date(s) Administered   Hepb-cpg 04/05/2018, 05/10/2018   Influenza-Unspecified 06/08/2020   MMR 04/19/2018   Moderna SARS-COV2 Booster Vaccination 06/08/2020   Moderna Sars-Covid-2 Vaccination 07/08/2019, 07/22/2019   Tdap 01/23/2020    Diabetes He presents for his follow-up diabetic visit. He has type 2 diabetes mellitus. Onset time: Diagnosed at approx age of 85. His disease course has been improving. There are no hypoglycemic associated symptoms. Pertinent negatives for diabetes include no weight loss. There are no hypoglycemic complications. There are no diabetic complications. Risk factors for coronary artery disease include diabetes mellitus, obesity, male sex, hypertension, family history and dyslipidemia. Current diabetic treatment includes diet and oral agent (monotherapy) (and Mounjaro). He is compliant with treatment most of the time. His weight is fluctuating minimally. He is following a generally healthy diet. When asked about meal planning, he reported none. He has not had a previous visit with a dietitian. He participates in exercise daily. (He presents today with no meter or logs.  His POCT A1c today is 6.2%, improving from last visit of 6.7%.  He has tolerated the Mounjaro well.  He has not lost weight but notes his clothes are fitting differently now.  He is working out more, generally feels better overall.) An ACE inhibitor/angiotensin II receptor blocker is being taken. He does not see a podiatrist.Eye exam is current.    Review of systems  Constitutional: + slowly increasing body weight,  current Body mass index is 52.84 kg/m. , no fatigue, no subjective hyperthermia, no subjective hypothermia Eyes: no blurry vision, no xerophthalmia ENT: no sore throat, no nodules palpated in throat, no  dysphagia/odynophagia, no hoarseness Cardiovascular: no chest pain, no shortness of breath, no palpitations, no leg swelling Respiratory: no cough, no shortness of breath Gastrointestinal: no nausea/vomiting/diarrhea Musculoskeletal: no muscle/joint aches Skin: no rashes, no hyperemia Neurological: no tremors, no numbness, no tingling, no dizziness Psychiatric: no depression, no anxiety  Objective:     BP (!) 136/95 (BP Location: Left Arm, Patient Position: Sitting, Cuff Size: Large)   Pulse 77   Ht 6\' 1"  (1.854 m)   Wt (!) 400  lb 8 oz (181.7 kg)   BMI 52.84 kg/m   Wt Readings from Last 3 Encounters:  10/21/22 (!) 400 lb 8 oz (181.7 kg)  05/31/22 (!) 393 lb (178.3 kg)  04/28/22 (!) 387 lb 3.2 oz (175.6 kg)     BP Readings from Last 3 Encounters:  10/21/22 (!) 136/95  05/31/22 130/88  04/28/22 138/70      Physical Exam- Limited  Constitutional:  Body mass index is 52.84 kg/m. , not in acute distress, normal state of mind Eyes:  EOMI, no exophthalmos Musculoskeletal: no gross deformities, strength intact in all four extremities, no gross restriction of joint movements Skin:  no rashes, no hyperemia Neurological: no tremor with outstretched hands   Diabetic Foot Exam - Simple   No data filed     CMP ( most recent) CMP     Component Value Date/Time   NA 136 10/23/2021 0918   NA 141 04/23/2020 0813   K 4.7 10/23/2021 0918   CL 96 10/23/2021 0918   CO2 30 10/23/2021 0918   GLUCOSE 222 (H) 10/23/2021 0918   BUN 13 10/23/2021 0918   BUN 9 04/23/2020 0813   CREATININE 0.69 10/23/2021 0918   CALCIUM 9.4 10/23/2021 0918   PROT 6.9 10/23/2021 0918   PROT 7.1 04/23/2020 0813   ALBUMIN 4.2 10/23/2021 0918   ALBUMIN 4.7 04/23/2020 0813   AST 22 10/23/2021 0918   ALT 36 10/23/2021 0918   ALKPHOS 70 10/23/2021 0918   BILITOT 0.7 10/23/2021 0918   BILITOT 0.5 04/23/2020 0813   GFRNONAA 133 06/20/2019 1056   GFRAA 154 06/20/2019 1056     Diabetic Labs (most  recent): Lab Results  Component Value Date   HGBA1C 6.2 (A) 10/21/2022   HGBA1C 6.7 (A) 05/31/2022   HGBA1C 6.9 (A) 01/25/2022   MICROALBUR 2.4 10/23/2021   MICROALBUR 11.6 01/23/2020   MICROALBUR 3.5 (H) 05/20/2017     Lipid Panel ( most recent) Lipid Panel     Component Value Date/Time   CHOL 134 10/23/2021 0918   CHOL 220 (H) 04/23/2020 0813   TRIG 237.0 (H) 10/23/2021 0918   HDL 36.80 (L) 10/23/2021 0918   HDL 41 04/23/2020 0813   CHOLHDL 4 10/23/2021 0918   VLDL 47.4 (H) 10/23/2021 0918   LDLCALC 151 (H) 04/23/2020 0813   LDLDIRECT 78.0 10/23/2021 0918   LABVLDL 28 04/23/2020 0813      Lab Results  Component Value Date   TSH 2.14 12/10/2020   TSH 1.420 06/20/2019   TSH 2.08 03/01/2018   TSH 1.600 03/03/2017   FREET4 0.82 03/01/2018           Assessment & Plan:   1) Type 2 diabetes mellitus with hyperglycemia, without long-term current use of insulin (HCC)  He presents today with no meter or logs.  His POCT A1c today is 6.2%, improving from last visit of 6.7%.  He has tolerated the Mounjaro well.  He has not lost weight but notes his clothes are fitting differently now.  He is working out more, generally feels better overall.  - GEORDAN BRASHERS has currently uncontrolled symptomatic type 2 DM since 32 years of age.   -Recent labs reviewed.  - I had a long discussion with him about the progressive nature of diabetes and the pathology behind its complications. -his diabetes is not currently complicated but he remains at a high risk for more acute and chronic complications which include CAD, CVA, CKD, retinopathy, and neuropathy. These are all  discussed in detail with him.  The following Lifestyle Medicine recommendations according to American College of Lifestyle Medicine [facility]) were discussed and offered to patient and he agrees to start the journey:  A. Whole Foods, Plant-based plate comprising of fruits and vegetables, plant-based proteins, whole-grain  carbohydrates was discussed in detail with the patient.   A list for source of those nutrients were also provided to the patient.  Patient will use only water or unsweetened tea for hydration. B.  The need to stay away from risky substances including alcohol, smoking; obtaining 7 to 9 hours of restorative sleep, at least 150 minutes of moderate intensity exercise weekly, the importance of healthy social connections,  and stress reduction techniques were discussed. C.  A full color page of  Calorie density of various food groups per pound showing examples of each food groups was provided to the patient.  - Nutritional counseling repeated at each appointment due to patients tendency to fall back in to old habits.  - The patient admits there is a room for improvement in their diet and drink choices. -  Suggestion is made for the patient to avoid simple carbohydrates from their diet including Cakes, Sweet Desserts / Pastries, Ice Cream, Soda (diet and regular), Sweet Tea, Candies, Chips, Cookies, Sweet Pastries, Store Bought Juices, Alcohol in Excess of 1-2 drinks a day, Artificial Sweeteners, Coffee Creamer, and "Sugar-free" Products. This will help patient to have stable blood glucose profile and potentially avoid unintended weight gain.   - I encouraged the patient to switch to unprocessed or minimally processed complex starch and increased protein intake (animal or plant source), fruits, and vegetables.   - Patient is advised to stick to a routine mealtimes to eat 3 meals a day and avoid unnecessary snacks (to snack only to correct hypoglycemia).  - I have approached him with the following individualized plan to manage his diabetes and patient agrees:   -He is advised to increase his Mounjaro to 15 mg SQ weekly (can finish up his current supply of 12.5 injections first) and continue Metformin 500 mg ER daily with breakfast.  -he is encouraged to start monitoring glucose once daily, before breakfast  and to call the clinic if he has readings less than 70 or above 300 for 3 tests in a row.  - Adjustment parameters are given to him for hypo and hyperglycemia in writing.  - Specific targets for  A1c; LDL, HDL, and Triglycerides were discussed with the patient.  2) Blood Pressure /Hypertension:  his blood pressure is controlled to target.   he is advised to continue his current medications including Losartan 100 mg p.o. daily with breakfast.  3) Lipids/Hyperlipidemia:    Review of his recent lipid panel from 10/23/21 showed controlled LDL at 78 and elevated triglycerides of 237 .  he is advised to continue Lipitor 20 mg daily at bedtime.  Side effects and precautions discussed with him.  He has appt with PCP coming up next month with labs.  4)  Weight/Diet:  his Body mass index is 52.84 kg/m.  -  clearly complicating his diabetes care.   he is a candidate for weight loss. I discussed with him the fact that loss of 5 - 10% of his  current body weight will have the most impact on his diabetes management.  Exercise, and detailed carbohydrates information provided  -  detailed on discharge instructions.  He has recently lost some weight, will benefit from continued weight loss.  5) Chronic Care/Health  Maintenance: -he is on ACEI/ARB and Statin medications and is encouraged to initiate and continue to follow up with Ophthalmology, Dentist, Podiatrist at least yearly or according to recommendations, and advised to stay away from smoking. I have recommended yearly flu vaccine and pneumonia vaccine at least every 5 years; moderate intensity exercise for up to 150 minutes weekly; and sleep for at least 7 hours a day.  - he is advised to maintain close follow up with Dana Allan, MD for primary care needs, as well as his other providers for optimal and coordinated care.      I spent  38  minutes in the care of the patient today including review of labs from CMP, Lipids, Thyroid Function, Hematology  (current and previous including abstractions from other facilities); face-to-face time discussing  his blood glucose readings/logs, discussing hypoglycemia and hyperglycemia episodes and symptoms, medications doses, his options of short and long term treatment based on the latest standards of care / guidelines;  discussion about incorporating lifestyle medicine;  and documenting the encounter. Risk reduction counseling performed per USPSTF guidelines to reduce obesity and cardiovascular risk factors.     Please refer to Patient Instructions for Blood Glucose Monitoring and Insulin/Medications Dosing Guide"  in media tab for additional information. Please  also refer to " Patient Self Inventory" in the Media  tab for reviewed elements of pertinent patient history.  Rickey Payne participated in the discussions, expressed understanding, and voiced agreement with the above plans.  All questions were answered to his satisfaction. he is encouraged to contact clinic should he have any questions or concerns prior to his return visit.     Follow up plan: - Return in about 4 months (around 02/20/2023) for Diabetes F/U with A1c in office, No previsit labs.   Ronny Bacon, [facility] [address] [place], [place] [address] Phone: [phone] Fax: [phone]  10/21/2022, 2:02 PM

## 2022-10-27 ENCOUNTER — Other Ambulatory Visit (INDEPENDENT_AMBULATORY_CARE_PROVIDER_SITE_OTHER): Payer: Managed Care, Other (non HMO)

## 2022-10-27 DIAGNOSIS — E559 Vitamin D deficiency, unspecified: Secondary | ICD-10-CM | POA: Diagnosis not present

## 2022-10-27 DIAGNOSIS — Z114 Encounter for screening for human immunodeficiency virus [HIV]: Secondary | ICD-10-CM

## 2022-10-27 DIAGNOSIS — I152 Hypertension secondary to endocrine disorders: Secondary | ICD-10-CM | POA: Diagnosis not present

## 2022-10-27 DIAGNOSIS — E1159 Type 2 diabetes mellitus with other circulatory complications: Secondary | ICD-10-CM

## 2022-10-27 DIAGNOSIS — E1169 Type 2 diabetes mellitus with other specified complication: Secondary | ICD-10-CM

## 2022-10-27 DIAGNOSIS — E782 Mixed hyperlipidemia: Secondary | ICD-10-CM

## 2022-10-27 LAB — VITAMIN D 25 HYDROXY (VIT D DEFICIENCY, FRACTURES): VITD: 58.41 ng/mL (ref 30.00–100.00)

## 2022-10-27 LAB — COMPREHENSIVE METABOLIC PANEL WITH GFR
ALT: 49 U/L (ref 0–53)
AST: 25 U/L (ref 0–37)
Albumin: 4.1 g/dL (ref 3.5–5.2)
Alkaline Phosphatase: 73 U/L (ref 39–117)
BUN: 12 mg/dL (ref 6–23)
CO2: 29 meq/L (ref 19–32)
Calcium: 9.1 mg/dL (ref 8.4–10.5)
Chloride: 101 meq/L (ref 96–112)
Creatinine, Ser: 0.75 mg/dL (ref 0.40–1.50)
GFR: 119.28 mL/min (ref >60.00)
Glucose, Bld: 120 mg/dL — ABNORMAL HIGH (ref 70–99)
Potassium: 4.2 meq/L (ref 3.5–5.1)
Sodium: 137 meq/L (ref 135–145)
Total Bilirubin: 0.4 mg/dL (ref 0.2–1.2)
Total Protein: 7.1 g/dL (ref 6.0–8.3)

## 2022-10-27 LAB — LIPID PANEL
Cholesterol: 123 mg/dL (ref 0–200)
HDL: 31.6 mg/dL — ABNORMAL LOW (ref >39.00)
LDL Cholesterol: 73 mg/dL (ref 0–99)
NonHDL: 90.98
Total CHOL/HDL Ratio: 4
Triglycerides: 88 mg/dL (ref 0.0–149.0)
VLDL: 17.6 mg/dL (ref 0.0–40.0)

## 2022-10-27 LAB — CBC WITH DIFFERENTIAL/PLATELET
Basophils Absolute: 0 10*3/uL (ref 0.0–0.1)
Basophils Relative: 0.9 % (ref 0.0–3.0)
Eosinophils Absolute: 0.3 10*3/uL (ref 0.0–0.7)
Eosinophils Relative: 7.1 % — ABNORMAL HIGH (ref 0.0–5.0)
HCT: 45 % (ref 39.0–52.0)
Hemoglobin: 14.8 g/dL (ref 13.0–17.0)
Lymphocytes Relative: 53.5 % — ABNORMAL HIGH (ref 12.0–46.0)
Lymphs Abs: 2.3 10*3/uL (ref 0.7–4.0)
MCHC: 33 g/dL (ref 30.0–36.0)
MCV: 85.9 fl (ref 78.0–100.0)
Monocytes Absolute: 0.4 10*3/uL (ref 0.1–1.0)
Monocytes Relative: 9.5 % (ref 3.0–12.0)
Neutro Abs: 1.2 10*3/uL — ABNORMAL LOW (ref 1.4–7.7)
Neutrophils Relative %: 29 % — ABNORMAL LOW (ref 43.0–77.0)
Platelets: 352 10*3/uL (ref 150.0–400.0)
RBC: 5.24 Mil/uL (ref 4.22–5.81)
RDW: 13.2 % (ref 11.5–15.5)
WBC: 4.2 10*3/uL (ref 4.0–10.5)

## 2022-10-27 LAB — TSH: TSH: 1.54 u[IU]/mL (ref 0.35–5.50)

## 2022-10-27 LAB — VITAMIN B12: Vitamin B-12: 877 pg/mL (ref 211–911)

## 2022-10-29 LAB — HIV ANTIBODY (ROUTINE TESTING W REFLEX): HIV 1&2 Ab, 4th Generation: NONREACTIVE

## 2022-11-02 ENCOUNTER — Encounter: Payer: Managed Care, Other (non HMO) | Admitting: Family Medicine

## 2022-11-08 ENCOUNTER — Other Ambulatory Visit: Payer: Self-pay

## 2022-11-09 ENCOUNTER — Encounter: Payer: Self-pay | Admitting: Family Medicine

## 2022-11-09 ENCOUNTER — Ambulatory Visit (INDEPENDENT_AMBULATORY_CARE_PROVIDER_SITE_OTHER): Payer: Managed Care, Other (non HMO) | Admitting: Family Medicine

## 2022-11-09 VITALS — BP 124/76 | HR 87 | Temp 97.8°F | Resp 16 | Ht 72.5 in | Wt >= 6400 oz

## 2022-11-09 DIAGNOSIS — Z0000 Encounter for general adult medical examination without abnormal findings: Secondary | ICD-10-CM | POA: Diagnosis not present

## 2022-11-09 DIAGNOSIS — E785 Hyperlipidemia, unspecified: Secondary | ICD-10-CM

## 2022-11-09 DIAGNOSIS — Z7985 Long-term (current) use of injectable non-insulin antidiabetic drugs: Secondary | ICD-10-CM

## 2022-11-09 DIAGNOSIS — Z7984 Long term (current) use of oral hypoglycemic drugs: Secondary | ICD-10-CM

## 2022-11-09 DIAGNOSIS — Z6841 Body Mass Index (BMI) 40.0 and over, adult: Secondary | ICD-10-CM

## 2022-11-09 DIAGNOSIS — E118 Type 2 diabetes mellitus with unspecified complications: Secondary | ICD-10-CM

## 2022-11-09 DIAGNOSIS — I10 Essential (primary) hypertension: Secondary | ICD-10-CM

## 2022-11-09 DIAGNOSIS — E1169 Type 2 diabetes mellitus with other specified complication: Secondary | ICD-10-CM | POA: Diagnosis not present

## 2022-11-09 DIAGNOSIS — I152 Hypertension secondary to endocrine disorders: Secondary | ICD-10-CM

## 2022-11-09 DIAGNOSIS — E66813 Obesity, class 3: Secondary | ICD-10-CM

## 2022-11-09 DIAGNOSIS — E1159 Type 2 diabetes mellitus with other circulatory complications: Secondary | ICD-10-CM | POA: Diagnosis not present

## 2022-11-09 LAB — MICROALBUMIN / CREATININE URINE RATIO
Creatinine,U: 94.1 mg/dL
Microalb Creat Ratio: 1.3 mg/g (ref 0.0–30.0)
Microalb, Ur: 1.2 mg/dL (ref 0.0–1.9)

## 2022-11-09 MED ORDER — HYDROCHLOROTHIAZIDE 25 MG PO TABS: ORAL_TABLET | ORAL | 1 refills | Status: DC

## 2022-11-09 MED ORDER — LOSARTAN POTASSIUM 100 MG PO TABS: ORAL_TABLET | ORAL | 3 refills | Status: DC

## 2022-11-09 MED ORDER — ATORVASTATIN CALCIUM 20 MG PO TABS: ORAL_TABLET | ORAL | 3 refills | Status: DC

## 2022-11-09 NOTE — Progress Notes (Signed)
SUBJECTIVE:   Chief Complaint  Patient presents with   Annual Exam   HPI Patient presents to clinic for annual physical  Discussed the use of AI scribe software for clinical note transcription with the patient, who gave verbal consent to proceed.  History of Present Illness The patient, with a history of diabetes, hypertension, and hyperlipidemia, presents for a routine physical. He reports overall good health and recent weight loss of seven pounds since starting Mounjaro. However, he notes fluctuations in weight and a decrease in appetite, often having to remind himself to eat. The patient also mentions a recent increase in [facility] dosage from 12.5 to 15 mg, as he felt his body was becoming immune to the lower dose.  Despite the weight loss, the patient has gained six pounds since his last visit, which he attributes to a recent cruise vacation and a temporary pause in his usual four-day-a-week workout routine. He is also on Metformin, which has been decreased to one pill a day, and Losartan 100 mg for hypertension.  The patient reports a discrepancy in his hydrochlorothiazide prescription, stating he was supposed to increase to 25 mg but has been receiving 12.5 mg. His home blood pressure readings are consistent, usually around 130/80.  Regarding his diabetes management, the patient's blood sugar levels have significantly improved, dropping from 222 to 120.   The patient has seasonal allergies, which may explain the elevated lymphocytes in his recent blood work. He has not received the flu or pneumonia vaccines but is up-to-date on his tetanus vaccine.  The patient recently received a promotion at work, which he describes as causing "stress." He denies any chest pain, shortness of breath, problems with urination, or leg swelling.    PERTINENT PMH / PSH: As Above  OBJECTIVE:  BP 124/76   Pulse 87   Temp 97.8 F (36.6 C)   Resp 16   Ht 6' 0.5" (1.842 m)   Wt (!) 406 lb 6 oz  (184.3 kg)   SpO2 96%   BMI 54.36 kg/m    Physical Exam Constitutional:      General: He is not in acute distress.    Appearance: He is obese. He is not ill-appearing.  HENT:     Head: Normocephalic.  Eyes:     Conjunctiva/sclera: Conjunctivae normal.  Neck:     Thyroid: No thyromegaly or thyroid tenderness.  Cardiovascular:     Rate and Rhythm: Normal rate and regular rhythm.     Pulses: Normal pulses.  Pulmonary:     Effort: Pulmonary effort is normal.     Breath sounds: Normal breath sounds.  Abdominal:     General: Bowel sounds are normal.  Neurological:     Mental Status: He is alert. Mental status is at baseline.  Psychiatric:        Mood and Affect: Mood normal.        Behavior: Behavior normal.        Thought Content: Thought content normal.        Judgment: Judgment normal.       11/09/2022   11:29 AM 04/28/2022   10:59 AM 10/23/2021    8:49 AM 04/23/2020    7:34 AM 06/20/2019   10:08 AM  Depression screen PHQ 2/9  Decreased Interest 0 0 0 0 0  Down, Depressed, Hopeless 0 0 0 0 0  PHQ - 2 Score 0 0 0 0 0  Altered sleeping 0   0   Tired, decreased energy 0  0   Change in appetite 2   0   Feeling bad or failure about yourself  0   0   Trouble concentrating 0   0   Moving slowly or fidgety/restless 0   0   Suicidal thoughts 0   0   PHQ-9 Score 2   0   Difficult doing work/chores Not difficult at all   Not difficult at all         11/09/2022   11:30 AM 04/28/2022   10:59 AM 04/23/2020    7:34 AM 06/20/2019   10:09 AM  GAD 7 : Generalized Anxiety Score  Nervous, Anxious, on Edge 0 0 0 0  Control/stop worrying 0 0 0 0  Worry too much - different things 0 0 0 0  Trouble relaxing 0 0 0 0  Restless 0 0 0 0  Easily annoyed or irritable 0 0 0 0  Afraid - awful might happen 0 0 0 0  Total GAD 7 Score 0 0 0 0  Anxiety Difficulty Not difficult at all Not difficult at all Not difficult at all Not difficult at all      ASSESSMENT/PLAN:  Annual physical  exam Assessment & Plan: Annual labs reviewed PHQ9/GAD screening completed Continue to work on healthy lifestyle and habits Immunizations up to date Hep C/HIV screening completed Foot exam due at next visit   Hyperlipidemia, unspecified hyperlipidemia type Assessment & Plan: Refill Atorvastatin 20 mg daily  Orders: -     Atorvastatin Calcium; TAKE 1 TABLET(20 MG) BY MOUTH DAILY  Dispense: 90 tablet; Refill: 3  Type 2 diabetes mellitus with complications (HCC) Assessment & Plan: Improved glycemic control with Mounjaro 15mg  and Metformin 500mg  daily. Recent weight loss of 7 pounds, but recent weight gain of 6 pounds after a cruise. Discussed potential future switch to Ozempic if Greggory Keen becomes less effective. -Continue current regimen of Mounjaro 15mg  and Metformin 500mg  daily. -Continue to follow with Endocrinology -Order urine microalbumin to creatinine ratio to screen for diabetic nephropathy.  Orders: -     Microalbumin / creatinine urine ratio  Hypertension associated with diabetes (HCC) Assessment & Plan: Blood pressure controlled on Losartan 100mg  daily. Patient reports receiving Hydrochlorothiazide 12.5mg  instead of the prescribed 25mg . -Start Hydrochlorothiazide prescription to 25mg  daily. -Continue Losartan at current dose -Check blood pressure at home and report if consistently above 130/80.  Orders: -     Losartan Potassium; TAKE 1 TABLET(100 MG) BY MOUTH DAILY  Dispense: 90 tablet; Refill: 3 -     hydroCHLOROthiazide; Take 1 tablet daily  Dispense: 90 tablet; Refill: 1  Hyperlipidemia associated with type 2 diabetes mellitus (HCC) Assessment & Plan: Refill Atorvastatin 20 mg daily   Class 3 severe obesity due to excess calories with serious comorbidity and body mass index (BMI) of 50.0 to 59.9 in adult [facility]) Assessment & Plan: BMI remains elevated. Initial weight 415 now 406 pounds. Recent increase tirzepatide 15 mg weekly.  Managed by endocrinology. Works  out 3-5 times weekly Portion control with meals Encouraged to continue current management.     PDMP reviewed  Return if symptoms worsen or fail to improve, for PCP.  Dana Allan, MD

## 2022-11-09 NOTE — Patient Instructions (Addendum)
It was a pleasure meeting you today. Thank you for allowing me to take part in your health care.  Our goals for today as we discussed include:  Increase Hydrochlorothiazide to 25 mg daily Monitor Blood pressure if less than 90/60 go back to 12.5 mg daily  Refills sent for requested medications  Urine today Foot exam at next visit   Forms completed for physical  Follow up as needed  If you have any questions or concerns, please do not hesitate to call the office at [phone].  I look forward to our next visit and until then take care and stay safe.  Regards,   Dana Allan, MD   [facility]

## 2022-11-10 ENCOUNTER — Encounter: Payer: Self-pay | Admitting: Family Medicine

## 2022-11-17 ENCOUNTER — Encounter: Payer: Self-pay | Admitting: Family Medicine

## 2022-11-17 NOTE — Assessment & Plan Note (Signed)
BMI remains elevated. Initial weight 415 now 406 pounds. Recent increase tirzepatide 15 mg weekly.  Managed by endocrinology. Works out 3-5 times weekly Portion control with meals Encouraged to continue current management.

## 2022-11-17 NOTE — Assessment & Plan Note (Addendum)
Improved glycemic control with Mounjaro 15mg  and Metformin 500mg  daily. Recent weight loss of 7 pounds, but recent weight gain of 6 pounds after a cruise. Discussed potential future switch to Ozempic if Greggory Keen becomes less effective. -Continue current regimen of Mounjaro 15mg  and Metformin 500mg  daily. -Continue to follow with Endocrinology -Order urine microalbumin to creatinine ratio to screen for diabetic nephropathy.

## 2022-11-17 NOTE — Assessment & Plan Note (Signed)
Refill Atorvastatin 20 mg daily

## 2022-11-17 NOTE — Assessment & Plan Note (Signed)
Blood pressure controlled on Losartan 100mg  daily. Patient reports receiving Hydrochlorothiazide 12.5mg  instead of the prescribed 25mg . -Start Hydrochlorothiazide prescription to 25mg  daily. -Continue Losartan at current dose -Check blood pressure at home and report if consistently above 130/80.

## 2022-11-17 NOTE — Assessment & Plan Note (Signed)
Annual labs reviewed PHQ9/GAD screening completed Continue to work on healthy lifestyle and habits Immunizations up to date Hep C/HIV screening completed Foot exam due at next visit

## 2022-11-22 ENCOUNTER — Encounter: Payer: Self-pay | Admitting: Family Medicine

## 2022-12-07 ENCOUNTER — Other Ambulatory Visit: Payer: Self-pay

## 2022-12-31 ENCOUNTER — Other Ambulatory Visit: Payer: Self-pay

## 2023-01-03 ENCOUNTER — Other Ambulatory Visit: Payer: Self-pay

## 2023-01-18 ENCOUNTER — Other Ambulatory Visit (HOSPITAL_COMMUNITY): Payer: Self-pay

## 2023-01-27 ENCOUNTER — Other Ambulatory Visit: Payer: Self-pay

## 2023-02-22 ENCOUNTER — Ambulatory Visit: Payer: Managed Care, Other (non HMO) | Admitting: Nurse Practitioner

## 2023-02-25 ENCOUNTER — Ambulatory Visit (INDEPENDENT_AMBULATORY_CARE_PROVIDER_SITE_OTHER): Payer: Managed Care, Other (non HMO) | Admitting: Nurse Practitioner

## 2023-02-25 ENCOUNTER — Encounter: Payer: Self-pay | Admitting: Nurse Practitioner

## 2023-02-25 VITALS — BP 139/76 | HR 80 | Ht 72.5 in | Wt >= 6400 oz

## 2023-02-25 DIAGNOSIS — E1165 Type 2 diabetes mellitus with hyperglycemia: Secondary | ICD-10-CM

## 2023-02-25 DIAGNOSIS — E1159 Type 2 diabetes mellitus with other circulatory complications: Secondary | ICD-10-CM

## 2023-02-25 DIAGNOSIS — Z7985 Long-term (current) use of injectable non-insulin antidiabetic drugs: Secondary | ICD-10-CM | POA: Diagnosis not present

## 2023-02-25 DIAGNOSIS — Z7984 Long term (current) use of oral hypoglycemic drugs: Secondary | ICD-10-CM

## 2023-02-25 DIAGNOSIS — E559 Vitamin D deficiency, unspecified: Secondary | ICD-10-CM

## 2023-02-25 DIAGNOSIS — I152 Hypertension secondary to endocrine disorders: Secondary | ICD-10-CM

## 2023-02-25 DIAGNOSIS — E785 Hyperlipidemia, unspecified: Secondary | ICD-10-CM

## 2023-02-25 DIAGNOSIS — I10 Essential (primary) hypertension: Secondary | ICD-10-CM

## 2023-02-25 LAB — POCT GLYCOSYLATED HEMOGLOBIN (HGB A1C): Hemoglobin A1C: 6.7 % — AB (ref 4.0–5.6)

## 2023-02-25 NOTE — Progress Notes (Signed)
Endocrinology Follow Up Note       02/25/2023, 11:54 AM   Subjective:    Patient ID: Rickey Payne, male    DOB: 1990-07-18.  Rickey Payne is being seen in follow up after being seen in consultation for management of currently uncontrolled symptomatic diabetes requested by  Dana Allan, MD.   Past Medical History:  Diagnosis Date   Asthma    child   COVID-19    x2   Diabetes mellitus without complication (HCC)    Hyperlipidemia    Hypertension    Morbid obesity (HCC)    Sinus tachycardia     Past Surgical History:  Procedure Laterality Date   right wrist fracture      Social History   Socioeconomic History   Marital status: Married    Spouse name: Not on file   Number of children: Not on file   Years of education: Not on file   Highest education level: Not on file  Occupational History   Not on file  Tobacco Use   Smoking status: Never   Smokeless tobacco: Never  Substance and Sexual Activity   Alcohol use: Yes    Alcohol/week: 1.0 standard drink of alcohol    Types: 1 Cans of beer per week    Comment: occasional   Drug use: No   Sexual activity: Yes  Other Topics Concern   Not on file  Social History Narrative   Diploma, deputy detention officer    Married    2 kids 7 y.o boy, 6 month boy, wife pregnant as of 06/20/19    -as of 01/23/20 pt has 3 boys       Drinks occasionally    Never smoker, no chew    Owns guns, wears seat belt, safe in relationship       Social Drivers of Health   Financial Resource Strain: Medium Risk (10/15/2020)   Overall Financial Resource Strain (CARDIA)    Difficulty of Paying Living Expenses: Somewhat hard  Food Insecurity: Not on file  Transportation Needs: Not on file  Physical Activity: Not on file  Stress: Not on file  Social Connections: Not on file    Family History  Problem Relation Age of Onset   Hypertension Mother    Arthritis  Mother    Asthma Mother    Depression Mother    Hyperlipidemia Mother    Diabetes Mother    Hypertension Father    Depression Father    Hyperlipidemia Father    Intellectual disability Father    Stroke Father    Hepatitis C Father    Kidney failure Father        s/p transplant    Heart disease Maternal Grandfather    Diabetes Paternal Grandmother    Drug abuse Paternal Grandmother    Diabetes Maternal Grandmother     Outpatient Encounter Medications as of 02/25/2023  Medication Sig   atorvastatin (LIPITOR) 20 MG tablet TAKE 1 TABLET(20 MG) BY MOUTH DAILY   Blood Glucose Monitoring Suppl (ONETOUCH VERIO FLEX SYSTEM) w/Device KIT Use to monitor glucose twice daily   Cholecalciferol 1.25 MG (50000 UT) capsule Take 1 capsule (50,000 Units total)  by mouth once a week. D3 d/c after 6 months   fluticasone (FLONASE) 50 MCG/ACT nasal spray Place 1 spray into both nostrils daily as needed for allergies or rhinitis.   glucose blood (ONETOUCH VERIO) test strip Use as instructed   hydrochlorothiazide (HYDRODIURIL) 25 MG tablet Take 1 tablet daily   Lancets (ONETOUCH DELICA PLUS LANCET33G) MISC USE THREE TIMES DAILY AS DIRECTED   losartan (COZAAR) 100 MG tablet TAKE 1 TABLET(100 MG) BY MOUTH DAILY   metFORMIN (GLUCOPHAGE-XR) 500 MG 24 hr tablet Take 1 tablet (500 mg total) by mouth daily with breakfast. TAKE 2 TABLETS(1000 MG) BY MOUTH TWICE DAILY WITH A MEAL   Multiple Vitamin (MULTIVITAMIN PO) Take by mouth.   ondansetron (ZOFRAN) 4 MG tablet Take 1 tablet (4 mg total) by mouth 2 (two) times daily as needed.   tirzepatide (MOUNJARO) 15 MG/0.5ML Pen Inject 15 mg into the skin once a week.   No facility-administered encounter medications on file as of 02/25/2023.    ALLERGIES: No Known Allergies  VACCINATION STATUS: Immunization History  Administered Date(s) Administered   Hepb-cpg 04/05/2018, 05/10/2018   Influenza-Unspecified 06/08/2020   MMR 04/19/2018   Moderna SARS-COV2 Booster  Vaccination 06/08/2020   Moderna Sars-Covid-2 Vaccination 07/08/2019, 07/22/2019   Tdap 01/23/2020    Diabetes He presents for his follow-up diabetic visit. He has type 2 diabetes mellitus. Onset time: Diagnosed at approx age of 45. His disease course has been stable. There are no hypoglycemic associated symptoms. Pertinent negatives for diabetes include no weight loss. There are no hypoglycemic complications. There are no diabetic complications. Risk factors for coronary artery disease include diabetes mellitus, obesity, male sex, hypertension, family history and dyslipidemia. Current diabetic treatment includes diet and oral agent (monotherapy) (and Mounjaro). He is compliant with treatment most of the time. His weight is fluctuating minimally. He is following a generally healthy diet. When asked about meal planning, he reported none. He has not had a previous visit with a dietitian. He participates in exercise daily. His overall blood glucose range is 110-130 mg/dl. (He presents today with his logs showing at target glycemic profile.  His POCT A1c today is 6.7%, increasing slightly from last visit of 6.2%.  He has tolerated the Mounjaro well.  He continues to work out, sometimes will take a pre-work out supplement.  He has been eating 3 meals per day but his weight has increased.) An ACE inhibitor/angiotensin II receptor blocker is being taken. He does not see a podiatrist.Eye exam is current.    Review of systems  Constitutional: + increasing body weight,  current Body mass index is 55.83 kg/m. , no fatigue, no subjective hyperthermia, no subjective hypothermia Eyes: no blurry vision, no xerophthalmia ENT: no sore throat, no nodules palpated in throat, no dysphagia/odynophagia, no hoarseness Cardiovascular: no chest pain, no shortness of breath, no palpitations, no leg swelling Respiratory: no cough, no shortness of breath Gastrointestinal: no nausea/vomiting/diarrhea Musculoskeletal: no  muscle/joint aches Skin: no rashes, no hyperemia Neurological: no tremors, no numbness, no tingling, no dizziness Psychiatric: no depression, no anxiety  Objective:     BP 139/76 (BP Location: Left Arm, Patient Position: Sitting, Cuff Size: Large)   Pulse 80   Ht 6' 0.5" (1.842 m)   Wt (!) 417 lb 6.4 oz (189.3 kg)   BMI 55.83 kg/m   Wt Readings from Last 3 Encounters:  02/25/23 (!) 417 lb 6.4 oz (189.3 kg)  11/09/22 (!) 406 lb 6 oz (184.3 kg)  10/21/22 (!) 400 lb 8  oz (181.7 kg)     BP Readings from Last 3 Encounters:  02/25/23 139/76  11/09/22 124/76  10/21/22 (!) 136/95      Physical Exam- Limited  Constitutional:  Body mass index is 55.83 kg/m. , not in acute distress, normal state of mind Eyes:  EOMI, no exophthalmos Musculoskeletal: no gross deformities, strength intact in all four extremities, no gross restriction of joint movements Skin:  no rashes, no hyperemia Neurological: no tremor with outstretched hands   Diabetic Foot Exam - Simple   No data filed     CMP ( most recent) CMP     Component Value Date/Time   NA 137 10/27/2022 0733   NA 141 04/23/2020 0813   K 4.2 10/27/2022 0733   CL 101 10/27/2022 0733   CO2 29 10/27/2022 0733   GLUCOSE 120 (H) 10/27/2022 0733   BUN 12 10/27/2022 0733   BUN 9 04/23/2020 0813   CREATININE 0.75 10/27/2022 0733   CALCIUM 9.1 10/27/2022 0733   PROT 7.1 10/27/2022 0733   PROT 7.1 04/23/2020 0813   ALBUMIN 4.1 10/27/2022 0733   ALBUMIN 4.7 04/23/2020 0813   AST 25 10/27/2022 0733   ALT 49 10/27/2022 0733   ALKPHOS 73 10/27/2022 0733   BILITOT 0.4 10/27/2022 0733   BILITOT 0.5 04/23/2020 0813   GFRNONAA 133 06/20/2019 1056   GFRAA 154 06/20/2019 1056     Diabetic Labs (most recent): Lab Results  Component Value Date   HGBA1C 6.7 (A) 02/25/2023   HGBA1C 6.2 (A) 10/21/2022   HGBA1C 6.7 (A) 05/31/2022   MICROALBUR 1.2 11/09/2022   MICROALBUR 2.4 10/23/2021   MICROALBUR 11.6 01/23/2020     Lipid Panel  ( most recent) Lipid Panel     Component Value Date/Time   CHOL 123 10/27/2022 0733   CHOL 220 (H) 04/23/2020 0813   TRIG 88.0 10/27/2022 0733   HDL 31.60 (L) 10/27/2022 0733   HDL 41 04/23/2020 0813   CHOLHDL 4 10/27/2022 0733   VLDL 17.6 10/27/2022 0733   LDLCALC 73 10/27/2022 0733   LDLCALC 151 (H) 04/23/2020 0813   LDLDIRECT 78.0 10/23/2021 0918   LABVLDL 28 04/23/2020 0813      Lab Results  Component Value Date   TSH 1.54 10/27/2022   TSH 2.14 12/10/2020   TSH 1.420 06/20/2019   TSH 2.08 03/01/2018   TSH 1.600 03/03/2017   FREET4 0.82 03/01/2018           Assessment & Plan:   1) Type 2 diabetes mellitus with hyperglycemia, without long-term current use of insulin (HCC)  He presents today with his logs showing at target glycemic profile.  His POCT A1c today is 6.7%, increasing slightly from last visit of 6.2%.  He has tolerated the Mounjaro well.  He continues to work out, sometimes will take a pre-work out supplement.  He has been eating 3 meals per day but his weight has increased.  Rickey Payne has currently uncontrolled symptomatic type 2 DM since 33 years of age.   -Recent labs reviewed.  - I had a long discussion with him about the progressive nature of diabetes and the pathology behind its complications. -his diabetes is not currently complicated but he remains at a high risk for more acute and chronic complications which include CAD, CVA, CKD, retinopathy, and neuropathy. These are all discussed in detail with him.  The following Lifestyle Medicine recommendations according to American College of Lifestyle Medicine [facility]) were discussed and offered to patient  and he agrees to start the journey:  A. Whole Foods, Plant-based plate comprising of fruits and vegetables, plant-based proteins, whole-grain carbohydrates was discussed in detail with the patient.   A list for source of those nutrients were also provided to the patient.  Patient will use only water  or unsweetened tea for hydration. B.  The need to stay away from risky substances including alcohol, smoking; obtaining 7 to 9 hours of restorative sleep, at least 150 minutes of moderate intensity exercise weekly, the importance of healthy social connections,  and stress reduction techniques were discussed. C.  A full color page of  Calorie density of various food groups per pound showing examples of each food groups was provided to the patient.  - Nutritional counseling repeated at each appointment due to patients tendency to fall back in to old habits.  - The patient admits there is a room for improvement in their diet and drink choices. -  Suggestion is made for the patient to avoid simple carbohydrates from their diet including Cakes, Sweet Desserts / Pastries, Ice Cream, Soda (diet and regular), Sweet Tea, Candies, Chips, Cookies, Sweet Pastries, Store Bought Juices, Alcohol in Excess of 1-2 drinks a day, Artificial Sweeteners, Coffee Creamer, and "Sugar-free" Products. This will help patient to have stable blood glucose profile and potentially avoid unintended weight gain.   - I encouraged the patient to switch to unprocessed or minimally processed complex starch and increased protein intake (animal or plant source), fruits, and vegetables.   - Patient is advised to stick to a routine mealtimes to eat 3 meals a day and avoid unnecessary snacks (to snack only to correct hypoglycemia).  I did enter referral to see Norm Salt, RDE for diet education to help with weight loss.  I am having him keep a food diary for the 2 weeks prior to his return so we can look at it together for areas of improvement.  - I have approached him with the following individualized plan to manage his diabetes and patient agrees:   -He is advised to continue his Mounjaro 15 mg SQ weekly and continue Metformin 500 mg ER daily with breakfast.  -he is encouraged to monitoring glucose once a week, before  breakfast.  - Adjustment parameters are given to him for hypo and hyperglycemia in writing.  - Specific targets for  A1c; LDL, HDL, and Triglycerides were discussed with the patient.  2) Blood Pressure /Hypertension:  his blood pressure is controlled to target.   he is advised to continue his current medications including Losartan 100 mg p.o. daily with breakfast.  3) Lipids/Hyperlipidemia:    Review of his recent lipid panel from 10/23/21 showed controlled LDL at 78 and elevated triglycerides of 237 .  he is advised to continue Lipitor 20 mg daily at bedtime.  Side effects and precautions discussed with him.  He has appt with PCP coming up next month with labs.  4)  Weight/Diet:  his Body mass index is 55.83 kg/m.  -  clearly complicating his diabetes care.   he is a candidate for weight loss. I discussed with him the fact that loss of 5 - 10% of his  current body weight will have the most impact on his diabetes management.  Exercise, and detailed carbohydrates information provided  -  detailed on discharge instructions.  He has recently GAINED some weight, will benefit from continued weight loss attempts.  5) Chronic Care/Health Maintenance: -he is on ACEI/ARB and Statin medications and is  encouraged to initiate and continue to follow up with Ophthalmology, Dentist, Podiatrist at least yearly or according to recommendations, and advised to stay away from smoking. I have recommended yearly flu vaccine and pneumonia vaccine at least every 5 years; moderate intensity exercise for up to 150 minutes weekly; and sleep for at least 7 hours a day.  - he is advised to maintain close follow up with Dana Allan, MD for primary care needs, as well as his other providers for optimal and coordinated care.     I spent  30  minutes in the care of the patient today including review of labs from CMP, Lipids, Thyroid Function, Hematology (current and previous including abstractions from other facilities);  face-to-face time discussing  his blood glucose readings/logs, discussing hypoglycemia and hyperglycemia episodes and symptoms, medications doses, his options of short and long term treatment based on the latest standards of care / guidelines;  discussion about incorporating lifestyle medicine;  and documenting the encounter. Risk reduction counseling performed per USPSTF guidelines to reduce obesity and cardiovascular risk factors.     Please refer to Patient Instructions for Blood Glucose Monitoring and Insulin/Medications Dosing Guide"  in media tab for additional information. Please  also refer to " Patient Self Inventory" in the Media  tab for reviewed elements of pertinent patient history.  Rickey Payne participated in the discussions, expressed understanding, and voiced agreement with the above plans.  All questions were answered to his satisfaction. he is encouraged to contact clinic should he have any questions or concerns prior to his return visit.     Follow up plan: - Return in about 4 months (around 06/25/2023) for Diabetes F/U with A1c in office, Bring meter and logs.   Ronny Bacon, [facility] [address] [place], [place] [address] Phone: [phone] Fax: [phone]  02/25/2023, 11:54 AM

## 2023-03-01 ENCOUNTER — Other Ambulatory Visit: Payer: Self-pay

## 2023-03-28 ENCOUNTER — Other Ambulatory Visit: Payer: Self-pay

## 2023-04-20 ENCOUNTER — Other Ambulatory Visit: Payer: Self-pay | Admitting: Family Medicine

## 2023-04-20 ENCOUNTER — Other Ambulatory Visit: Payer: Self-pay

## 2023-04-20 DIAGNOSIS — E1159 Type 2 diabetes mellitus with other circulatory complications: Secondary | ICD-10-CM

## 2023-04-21 LAB — HM DIABETES EYE EXAM

## 2023-05-24 ENCOUNTER — Encounter: Payer: Self-pay | Admitting: Nurse Practitioner

## 2023-05-24 ENCOUNTER — Other Ambulatory Visit: Payer: Self-pay

## 2023-05-24 ENCOUNTER — Other Ambulatory Visit (HOSPITAL_COMMUNITY): Payer: Self-pay

## 2023-05-24 ENCOUNTER — Telehealth: Payer: Self-pay

## 2023-05-24 NOTE — Telephone Encounter (Signed)
 Pharmacy Patient Advocate Encounter   Received notification from Patient Advice Request messages that prior authorization for Mounjaro is required/requested.   Insurance verification completed.   The patient is insured through [employer] .   Per test claim: PA required; PA submitted to above mentioned insurance via CoverMyMeds Key/confirmation #/EOC [id number] Status is pending

## 2023-05-30 ENCOUNTER — Other Ambulatory Visit: Payer: Self-pay

## 2023-05-31 ENCOUNTER — Other Ambulatory Visit: Payer: Self-pay

## 2023-06-17 NOTE — Telephone Encounter (Signed)
 Pharmacy Patient Advocate Encounter  Received notification from [employer] that Prior Authorization for Mounjaro  has been APPROVED through 05/29/2024   PA #/Case ID/Reference #: [id number]

## 2023-06-20 NOTE — Telephone Encounter (Signed)
 Patient was called and made aware.

## 2023-06-27 ENCOUNTER — Other Ambulatory Visit: Payer: Self-pay

## 2023-06-30 ENCOUNTER — Encounter: Payer: Managed Care, Other (non HMO) | Attending: Nurse Practitioner | Admitting: Nutrition

## 2023-06-30 ENCOUNTER — Ambulatory Visit (INDEPENDENT_AMBULATORY_CARE_PROVIDER_SITE_OTHER): Payer: Managed Care, Other (non HMO) | Admitting: Nurse Practitioner

## 2023-06-30 ENCOUNTER — Encounter: Payer: Self-pay | Admitting: Nurse Practitioner

## 2023-06-30 ENCOUNTER — Encounter: Payer: Self-pay | Admitting: Nutrition

## 2023-06-30 VITALS — Ht 73.0 in | Wt >= 6400 oz

## 2023-06-30 VITALS — BP 110/80 | HR 80 | Ht 72.5 in | Wt >= 6400 oz

## 2023-06-30 DIAGNOSIS — E1159 Type 2 diabetes mellitus with other circulatory complications: Secondary | ICD-10-CM | POA: Diagnosis present

## 2023-06-30 DIAGNOSIS — E118 Type 2 diabetes mellitus with unspecified complications: Secondary | ICD-10-CM | POA: Diagnosis present

## 2023-06-30 DIAGNOSIS — E785 Hyperlipidemia, unspecified: Secondary | ICD-10-CM

## 2023-06-30 DIAGNOSIS — I152 Hypertension secondary to endocrine disorders: Secondary | ICD-10-CM

## 2023-06-30 DIAGNOSIS — E559 Vitamin D deficiency, unspecified: Secondary | ICD-10-CM | POA: Diagnosis not present

## 2023-06-30 DIAGNOSIS — E1165 Type 2 diabetes mellitus with hyperglycemia: Secondary | ICD-10-CM

## 2023-06-30 DIAGNOSIS — E66813 Obesity, class 3: Secondary | ICD-10-CM | POA: Insufficient documentation

## 2023-06-30 DIAGNOSIS — I10 Essential (primary) hypertension: Secondary | ICD-10-CM

## 2023-06-30 DIAGNOSIS — Z7985 Long-term (current) use of injectable non-insulin antidiabetic drugs: Secondary | ICD-10-CM

## 2023-06-30 DIAGNOSIS — R110 Nausea: Secondary | ICD-10-CM

## 2023-06-30 DIAGNOSIS — E1169 Type 2 diabetes mellitus with other specified complication: Secondary | ICD-10-CM | POA: Insufficient documentation

## 2023-06-30 DIAGNOSIS — Z6841 Body Mass Index (BMI) 40.0 and over, adult: Secondary | ICD-10-CM | POA: Insufficient documentation

## 2023-06-30 DIAGNOSIS — Z7984 Long term (current) use of oral hypoglycemic drugs: Secondary | ICD-10-CM

## 2023-06-30 LAB — POCT GLYCOSYLATED HEMOGLOBIN (HGB A1C): Hemoglobin A1C: 6.7 % — AB (ref 4.0–5.6)

## 2023-06-30 MED ORDER — ONDANSETRON HCL 4 MG PO TABS: 4.0000 mg | ORAL_TABLET | Freq: Two times a day (BID) | ORAL | 1 refills | Status: AC | PRN

## 2023-06-30 MED ORDER — METFORMIN HCL ER 500 MG PO TB24: 500.0000 mg | ORAL_TABLET | Freq: Every day | ORAL | 3 refills | Status: DC

## 2023-06-30 MED ORDER — TIRZEPATIDE 15 MG/0.5ML ~~LOC~~ SOAJ
15.0000 mg | SUBCUTANEOUS | 3 refills | Status: DC
  Filled 2023-10-06 – 2023-10-18 (×2): qty 6, 84d supply, fill #0

## 2023-06-30 NOTE — Patient Instructions (Addendum)
 Goals Eat 3 meal per day at times discussed Work on meal planning and meal prepping. Keep food journal Drink a gallon of water per day Focus on eating fruits, vegetables and whole grains-- foods from garden Lose 1-2 lbs per week Keep working out 5 days per week.

## 2023-06-30 NOTE — Progress Notes (Signed)
 Medical Nutrition Therapy  Appointment Start time:  0830  Appointment End time:  1000  Primary concerns today: Type 2 DM, Obesity  Referral diagnosis: E11.8, E66.01 Preferred learning style: NO Preference  Learning readiness: Ready    NUTRITION ASSESSMENT  33 yr old bmale referred for Type 2 DM and Obesity Sees Hulon Magic, FNP at [facility]. Wants to get the gastric sleeve for help with weight loss. A1c 6.7%. on Mounjaro  15 mg a day. Metformin  500 mg once a day Eats 3 meals a day usually. Sometime skips meals. Has some junk food in diet. Working out in the mornings now. Wants to lose weight. Admits to some emotional eating he has had his whole life. 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  He is willing to work on changing his behaviors to make lifestyle changes to improve health and lose weight.  Clinical Medical Hx:  Past Medical History:  Diagnosis Date   Asthma    child   COVID-19    x2   Diabetes mellitus without complication (HCC)    Hyperlipidemia    Hypertension    Morbid obesity (HCC)    Sinus tachycardia     Medications:  Current Outpatient Medications on File Prior to Visit  Medication Sig Dispense Refill   atorvastatin  (LIPITOR) 20 MG tablet TAKE 1 TABLET(20 MG) BY MOUTH DAILY 90 tablet 3   Blood Glucose Monitoring Suppl (ONETOUCH VERIO FLEX SYSTEM) w/Device KIT Use to monitor glucose twice daily 1 kit 0   Cholecalciferol  1.25 MG (50000 UT) capsule Take 1 capsule (50,000 Units total) by mouth once a week. D3 d/c after 6 months 13 capsule 1   fluticasone (FLONASE) 50 MCG/ACT nasal spray Place 1 spray into both nostrils daily as needed for allergies or rhinitis.     glucose blood (ONETOUCH VERIO) test strip Use as instructed 100 each 12   hydrochlorothiazide  (HYDRODIURIL ) 25 MG tablet TAKE 1 TABLET DAILY 90 tablet 1   Lancets (ONETOUCH DELICA PLUS LANCET33G) MISC USE THREE TIMES DAILY AS DIRECTED 300 each 12   losartan  (COZAAR ) 100 MG tablet  TAKE 1 TABLET(100 MG) BY MOUTH DAILY 90 tablet 3   metFORMIN  (GLUCOPHAGE -XR) 500 MG 24 hr tablet Take 1 tablet (500 mg total) by mouth daily with breakfast. 90 tablet 3   Multiple Vitamin (MULTIVITAMIN PO) Take by mouth.     ondansetron  (ZOFRAN ) 4 MG tablet Take 1 tablet (4 mg total) by mouth 2 (two) times daily as needed. 90 tablet 1   tirzepatide  (MOUNJARO ) 15 MG/0.5ML Pen Inject 15 mg into the skin once a week. 6 mL 3   No current facility-administered medications on file prior to visit.    Labs:  Lab Results  Component Value Date   HGBA1C 6.7 (A) 06/30/2023      Latest Ref Rng & Units 10/27/2022    7:33 AM 10/23/2021    9:18 AM 12/10/2020   11:07 AM  CMP  Glucose 70 - 99 mg/dL 191  478  295   BUN 6 - 23 mg/dL 12  13  12    Creatinine 0.40 - 1.50 mg/dL 6.21  3.08  6.57   Sodium 135 - 145 mEq/L 137  136  136   Potassium 3.5 - 5.1 mEq/L 4.2  4.7  4.1   Chloride 96 - 112 mEq/L 101  96  96   CO2 19 - 32 mEq/L 29  30  28    Calcium  8.4 - 10.5 mg/dL 9.1  9.4  9.5   Total Protein 6.0 - 8.3 g/dL 7.1  6.9  7.0   Total Bilirubin 0.2 - 1.2 mg/dL 0.4  0.7  0.7   Alkaline Phos 39 - 117 U/L 73  70  57   AST 0 - 37 U/L 25  22  30    ALT 0 - 53 U/L 49  36  47    Lipid Panel     Component Value Date/Time   CHOL 123 10/27/2022 0733   CHOL 220 (H) 04/23/2020 0813   TRIG 88.0 10/27/2022 0733   HDL 31.60 (L) 10/27/2022 0733   HDL 41 04/23/2020 0813   CHOLHDL 4 10/27/2022 0733   VLDL 17.6 10/27/2022 0733   LDLCALC 73 10/27/2022 0733   LDLCALC 151 (H) 04/23/2020 0813   LDLDIRECT 78.0 10/23/2021 0918   LABVLDL 28 04/23/2020 0813      Notable Signs/Symptoms: Tired often  Lifestyle & Dietary Hx Married and works FT 3 small boys  Estimated daily fluid intake: 60 oz Supplements:  Sleep: varies Stress / self-care:  Current average weekly physical activity: working out now.  24-Hr Dietary Recall Trying to eat better meals   Estimated Energy Needs Calories: 2000-2200 Carbohydrate:  225g Protein: 150g Fat: 56g   NUTRITION DIAGNOSIS  NI-1.7 Predicted excessive energy intake As related to high calorie diet.  As evidenced by A1C 6.7% BMI 53.Rickey Payne   NUTRITION INTERVENTION  Nutrition education (E-1) on the following topics:  Nutrition and Diabetes education provided on My Plate, CHO counting, meal planning, portion sizes, timing of meals, avoiding snacks between meals unless having a low blood sugar, target ranges for A1C and blood sugars, signs/symptoms and treatment of hyper/hypoglycemia, monitoring blood sugars, taking medications as prescribed, benefits of exercising 30 minutes per day and prevention of complications of DM.  Lifestyle Medicine  - Whole Food, Plant Predominant Nutrition is highly recommended: Eat Plenty of vegetables, Mushrooms, fruits, Legumes, Whole Grains, Nuts, seeds in lieu of processed meats, processed snacks/pastries red meat, poultry, eggs.    -It is better to avoid simple carbohydrates including: Cakes, Sweet Desserts, Ice Cream, Soda (diet and regular), Sweet Tea, Candies, Chips, Cookies, Store Bought Juices, Alcohol in Excess of  1-2 drinks a day, Lemonade,  Artificial Sweeteners, Doughnuts, Coffee Creamers, "Sugar-free" Products, etc, etc.  This is not a complete list.....  Exercise: If you are able: 30 -60 minutes a day ,4 days a week, or 150 minutes a week.  The longer the better.  Combine stretch, strength, and aerobic activities.  If you were told in the past that you have high risk for cardiovascular diseases, you may seek evaluation by your heart doctor prior to initiating moderate to intense exercise programs.   Handouts Provided Include  Lifestyle Medicine Know your numbers   Learning Style & Readiness for Change Teaching method utilized: Visual & Auditory  Demonstrated degree of understanding via: Teach Back  Barriers to learning/adherence to lifestyle change: none  Goals Established by Pt Goals Eat 3 meal per day at times  discussed Work on meal planning and meal prepping. Keep food journal Drink a gallon of water per day Focus on eating fruits, vegetables and whole grains-- foods from garden Lose 1-2 lbs per week Keep working out 5 days per week.   MONITORING & EVALUATION Dietary intake, weekly physical activity, and weight and blood sugars in 1 month.  Next Steps  Patient is to work on meal planning .

## 2023-06-30 NOTE — Progress Notes (Signed)
 Endocrinology Follow Up Note       06/30/2023, 8:40 AM   Subjective:    Patient ID: Rickey Payne, male    DOB: 15-Apr-1990.  Rickey Payne is being seen in follow up after being seen in consultation for management of currently uncontrolled symptomatic diabetes requested by  Valli Gaw, MD.   Past Medical History:  Diagnosis Date   Asthma    child   COVID-19    x2   Diabetes mellitus without complication (HCC)    Hyperlipidemia    Hypertension    Morbid obesity (HCC)    Sinus tachycardia     Past Surgical History:  Procedure Laterality Date   right wrist fracture      Social History   Socioeconomic History   Marital status: Married    Spouse name: Not on file   Number of children: Not on file   Years of education: Not on file   Highest education level: Not on file  Occupational History   Not on file  Tobacco Use   Smoking status: Never   Smokeless tobacco: Never  Substance and Sexual Activity   Alcohol use: Yes    Alcohol/week: 1.0 standard drink of alcohol    Types: 1 Cans of beer per week    Comment: occasional   Drug use: No   Sexual activity: Yes  Other Topics Concern   Not on file  Social History Narrative   Diploma, deputy detention officer    Married    2 kids 7 y.o boy, 6 month boy, wife pregnant as of 06/20/19    -as of 01/23/20 pt has 3 boys       Drinks occasionally    Never smoker, no chew    Owns guns, wears seat belt, safe in relationship       Social Drivers of Health   Financial Resource Strain: Medium Risk (10/15/2020)   Overall Financial Resource Strain (CARDIA)    Difficulty of Paying Living Expenses: Somewhat hard  Food Insecurity: Not on file  Transportation Needs: Not on file  Physical Activity: Not on file  Stress: Not on file  Social Connections: Not on file    Family History  Problem Relation Age of Onset   Hypertension Mother    Arthritis Mother     Asthma Mother    Depression Mother    Hyperlipidemia Mother    Diabetes Mother    Hypertension Father    Depression Father    Hyperlipidemia Father    Intellectual disability Father    Stroke Father    Hepatitis C Father    Kidney failure Father        s/p transplant    Heart disease Maternal Grandfather    Diabetes Paternal Grandmother    Drug abuse Paternal Grandmother    Diabetes Maternal Grandmother     Outpatient Encounter Medications as of 06/30/2023  Medication Sig   atorvastatin  (LIPITOR) 20 MG tablet TAKE 1 TABLET(20 MG) BY MOUTH DAILY   Blood Glucose Monitoring Suppl (ONETOUCH VERIO FLEX SYSTEM) w/Device KIT Use to monitor glucose twice daily   Cholecalciferol  1.25 MG (50000 UT) capsule Take 1 capsule (50,000 Units total)  by mouth once a week. D3 d/c after 6 months   fluticasone (FLONASE) 50 MCG/ACT nasal spray Place 1 spray into both nostrils daily as needed for allergies or rhinitis.   glucose blood (ONETOUCH VERIO) test strip Use as instructed   hydrochlorothiazide  (HYDRODIURIL ) 25 MG tablet TAKE 1 TABLET DAILY   Lancets (ONETOUCH DELICA PLUS LANCET33G) MISC USE THREE TIMES DAILY AS DIRECTED   losartan  (COZAAR ) 100 MG tablet TAKE 1 TABLET(100 MG) BY MOUTH DAILY   Multiple Vitamin (MULTIVITAMIN PO) Take by mouth.   [DISCONTINUED] metFORMIN  (GLUCOPHAGE -XR) 500 MG 24 hr tablet Take 1 tablet (500 mg total) by mouth daily with breakfast. TAKE 2 TABLETS(1000 MG) BY MOUTH TWICE DAILY WITH A MEAL   [DISCONTINUED] ondansetron  (ZOFRAN ) 4 MG tablet Take 1 tablet (4 mg total) by mouth 2 (two) times daily as needed.   [DISCONTINUED] tirzepatide  (MOUNJARO ) 15 MG/0.5ML Pen Inject 15 mg into the skin once a week.   metFORMIN  (GLUCOPHAGE -XR) 500 MG 24 hr tablet Take 1 tablet (500 mg total) by mouth daily with breakfast. TAKE 2 TABLETS(1000 MG) BY MOUTH TWICE DAILY WITH A MEAL   ondansetron  (ZOFRAN ) 4 MG tablet Take 1 tablet (4 mg total) by mouth 2 (two) times daily as needed.    tirzepatide  (MOUNJARO ) 15 MG/0.5ML Pen Inject 15 mg into the skin once a week.   No facility-administered encounter medications on file as of 06/30/2023.    ALLERGIES: No Known Allergies  VACCINATION STATUS: Immunization History  Administered Date(s) Administered   Hepb-cpg 04/05/2018, 05/10/2018   Influenza-Unspecified 06/08/2020   MMR 04/19/2018   Moderna SARS-COV2 Booster Vaccination 06/08/2020   Moderna Sars-Covid-2 Vaccination 07/08/2019, 07/22/2019   Tdap 01/23/2020    Diabetes He presents for his follow-up diabetic visit. He has type 2 diabetes mellitus. Onset time: Diagnosed at approx age of 9. His disease course has been stable. There are no hypoglycemic associated symptoms. Pertinent negatives for diabetes include no weight loss. There are no hypoglycemic complications. There are no diabetic complications. Risk factors for coronary artery disease include diabetes mellitus, obesity, male sex, hypertension, family history and dyslipidemia. Current diabetic treatment includes diet and oral agent (monotherapy) (and Mounjaro ). He is compliant with treatment most of the time. His weight is decreasing steadily. He is following a generally healthy diet. When asked about meal planning, he reported none. He has not had a previous visit with a dietitian. He participates in exercise daily. (He presents today with no meter or logs to review.  He does monitor glucose at home but just forgot his meter today.  His POCT A1c today is 6.7%, unchanged from last visit.  He has tolerated the Mounjaro  well.  He continues to work out at least 3 days per week.  He notes he is looking into bariatric weight loss surgery as an option.) An ACE inhibitor/angiotensin II receptor blocker is being taken. He does not see a podiatrist.Eye exam is current.    Review of systems  Constitutional: +decreasing body weight,  current Body mass index is 54.49 kg/m. , no fatigue, no subjective hyperthermia, no subjective  hypothermia Eyes: no blurry vision, no xerophthalmia ENT: no sore throat, no nodules palpated in throat, no dysphagia/odynophagia, no hoarseness Cardiovascular: no chest pain, no shortness of breath, no palpitations, no leg swelling Respiratory: no cough, no shortness of breath Gastrointestinal: no nausea/vomiting/diarrhea Musculoskeletal: no muscle/joint aches Skin: no rashes, no hyperemia Neurological: no tremors, no numbness, no tingling, no dizziness Psychiatric: no depression, no anxiety  Objective:  BP 110/80 (BP Location: Left Arm, Patient Position: Sitting, Cuff Size: Large)   Pulse 80   Ht 6' 0.5" (1.842 m)   Wt (!) 407 lb 6.4 oz (184.8 kg)   BMI 54.49 kg/m   Wt Readings from Last 3 Encounters:  06/30/23 (!) 407 lb 6.4 oz (184.8 kg)  02/25/23 (!) 417 lb 6.4 oz (189.3 kg)  11/09/22 (!) 406 lb 6 oz (184.3 kg)     BP Readings from Last 3 Encounters:  06/30/23 110/80  02/25/23 139/76  11/09/22 124/76      Physical Exam- Limited  Constitutional:  Body mass index is 54.49 kg/m. , not in acute distress, normal state of mind Eyes:  EOMI, no exophthalmos Musculoskeletal: no gross deformities, strength intact in all four extremities, no gross restriction of joint movements Skin:  no rashes, no hyperemia Neurological: no tremor with outstretched hands   Diabetic Foot Exam - Simple   Simple Foot Form Diabetic Foot exam was performed with the following findings: Yes 06/30/2023  8:18 AM  Visual Inspection No deformities, no ulcerations, no other skin breakdown bilaterally: Yes Sensation Testing Intact to touch and monofilament testing bilaterally: Yes Pulse Check Posterior Tibialis and Dorsalis pulse intact bilaterally: Yes Comments     CMP ( most recent) CMP     Component Value Date/Time   NA 137 10/27/2022 0733   NA 141 04/23/2020 0813   K 4.2 10/27/2022 0733   CL 101 10/27/2022 0733   CO2 29 10/27/2022 0733   GLUCOSE 120 (H) 10/27/2022 0733   BUN 12  10/27/2022 0733   BUN 9 04/23/2020 0813   CREATININE 0.75 10/27/2022 0733   CALCIUM  9.1 10/27/2022 0733   PROT 7.1 10/27/2022 0733   PROT 7.1 04/23/2020 0813   ALBUMIN 4.1 10/27/2022 0733   ALBUMIN 4.7 04/23/2020 0813   AST 25 10/27/2022 0733   ALT 49 10/27/2022 0733   ALKPHOS 73 10/27/2022 0733   BILITOT 0.4 10/27/2022 0733   BILITOT 0.5 04/23/2020 0813   GFRNONAA 133 06/20/2019 1056   GFRAA 154 06/20/2019 1056     Diabetic Labs (most recent): Lab Results  Component Value Date   HGBA1C 6.7 (A) 06/30/2023   HGBA1C 6.7 (A) 02/25/2023   HGBA1C 6.2 (A) 10/21/2022   MICROALBUR 1.2 11/09/2022   MICROALBUR 2.4 10/23/2021   MICROALBUR 11.6 01/23/2020     Lipid Panel ( most recent) Lipid Panel     Component Value Date/Time   CHOL 123 10/27/2022 0733   CHOL 220 (H) 04/23/2020 0813   TRIG 88.0 10/27/2022 0733   HDL 31.60 (L) 10/27/2022 0733   HDL 41 04/23/2020 0813   CHOLHDL 4 10/27/2022 0733   VLDL 17.6 10/27/2022 0733   LDLCALC 73 10/27/2022 0733   LDLCALC 151 (H) 04/23/2020 0813   LDLDIRECT 78.0 10/23/2021 0918   LABVLDL 28 04/23/2020 0813      Lab Results  Component Value Date   TSH 1.54 10/27/2022   TSH 2.14 12/10/2020   TSH 1.420 06/20/2019   TSH 2.08 03/01/2018   TSH 1.600 03/03/2017   FREET4 0.82 03/01/2018           Assessment & Plan:   1) Type 2 diabetes mellitus with hyperglycemia, without long-term current use of insulin (HCC)  He presents today with no meter or logs to review.  He does monitor glucose at home but just forgot his meter today.  His POCT A1c today is 6.7%, unchanged from last visit.  He has tolerated the Mounjaro   well.  He continues to work out at least 3 days per week.  He notes he is looking into bariatric weight loss surgery as an option.  - Rickey Payne has currently uncontrolled symptomatic type 2 DM since 33 years of age.   -Recent labs reviewed.  - I had a long discussion with him about the progressive nature of diabetes  and the pathology behind its complications. -his diabetes is not currently complicated but he remains at a high risk for more acute and chronic complications which include CAD, CVA, CKD, retinopathy, and neuropathy. These are all discussed in detail with him.  The following Lifestyle Medicine recommendations according to American College of Lifestyle Medicine [facility]) were discussed and offered to patient and he agrees to start the journey:  A. Whole Foods, Plant-based plate comprising of fruits and vegetables, plant-based proteins, whole-grain carbohydrates was discussed in detail with the patient.   A list for source of those nutrients were also provided to the patient.  Patient will use only water or unsweetened tea for hydration. B.  The need to stay away from risky substances including alcohol, smoking; obtaining 7 to 9 hours of restorative sleep, at least 150 minutes of moderate intensity exercise weekly, the importance of healthy social connections,  and stress reduction techniques were discussed. C.  A full color page of  Calorie density of various food groups per pound showing examples of each food groups was provided to the patient.  - Nutritional counseling repeated at each appointment due to patients tendency to fall back in to old habits.  - The patient admits there is a room for improvement in their diet and drink choices. -  Suggestion is made for the patient to avoid simple carbohydrates from their diet including Cakes, Sweet Desserts / Pastries, Ice Cream, Soda (diet and regular), Sweet Tea, Candies, Chips, Cookies, Sweet Pastries, Store Bought Juices, Alcohol in Excess of 1-2 drinks a day, Artificial Sweeteners, Coffee Creamer, and "Sugar-free" Products. This will help patient to have stable blood glucose profile and potentially avoid unintended weight gain.   - I encouraged the patient to switch to unprocessed or minimally processed complex starch and increased protein intake (animal or  plant source), fruits, and vegetables.   - Patient is advised to stick to a routine mealtimes to eat 3 meals a day and avoid unnecessary snacks (to snack only to correct hypoglycemia).  I did enter referral to see Melva Stabile, RDE for diet education to help with weight loss last visit.  He sees her after his appt with me today.  - I have approached him with the following individualized plan to manage his diabetes and patient agrees:   -He is advised to continue his Mounjaro  15 mg SQ weekly and continue Metformin  500 mg ER daily with breakfast.  -he is encouraged to monitoring glucose once a week, before breakfast.  - Adjustment parameters are given to him for hypo and hyperglycemia in writing.  - Specific targets for  A1c; LDL, HDL, and Triglycerides were discussed with the patient.  2) Blood Pressure /Hypertension:  his blood pressure is controlled to target.   he is advised to continue his current medications including Losartan  100 mg p.o. daily with breakfast.  3) Lipids/Hyperlipidemia:    Review of his recent lipid panel from 10/23/21 showed controlled LDL at 78 and elevated triglycerides of 237 .  he is advised to continue Lipitor 20 mg daily at bedtime.  Side effects and precautions discussed with him.  Will recheck lipid panel prior to next visit.  4)  Weight/Diet:  his Body mass index is 54.49 kg/m.  -  clearly complicating his diabetes care.   he is a candidate for weight loss. I discussed with him the fact that loss of 5 - 10% of his  current body weight will have the most impact on his diabetes management.  Exercise, and detailed carbohydrates information provided  -  detailed on discharge instructions.  He has recently GAINED some weight, will benefit from continued weight loss attempts.  He notes he is considering sleeve gastrectomy.  Notes his mom had this done and his sister will be having it done in July with Dr. Jola Nash at [facility].  5) Chronic Care/Health Maintenance: -he is  on ACEI/ARB and Statin medications and is encouraged to initiate and continue to follow up with Ophthalmology, Dentist, Podiatrist at least yearly or according to recommendations, and advised to stay away from smoking. I have recommended yearly flu vaccine and pneumonia vaccine at least every 5 years; moderate intensity exercise for up to 150 minutes weekly; and sleep for at least 7 hours a day.  - he is advised to maintain close follow up with Valli Gaw, MD for primary care needs, as well as his other providers for optimal and coordinated care.     I spent  41  minutes in the care of the patient today including review of labs from CMP, Lipids, Thyroid Function, Hematology (current and previous including abstractions from other facilities); face-to-face time discussing  his blood glucose readings/logs, discussing hypoglycemia and hyperglycemia episodes and symptoms, medications doses, his options of short and long term treatment based on the latest standards of care / guidelines;  discussion about incorporating lifestyle medicine;  and documenting the encounter. Risk reduction counseling performed per USPSTF guidelines to reduce obesity and cardiovascular risk factors.     Please refer to Patient Instructions for Blood Glucose Monitoring and Insulin/Medications Dosing Guide"  in media tab for additional information. Please  also refer to " Patient Self Inventory" in the Media  tab for reviewed elements of pertinent patient history.  Rickey Payne participated in the discussions, expressed understanding, and voiced agreement with the above plans.  All questions were answered to his satisfaction. he is encouraged to contact clinic should he have any questions or concerns prior to his return visit.     Follow up plan: - Return in about 4 months (around 10/31/2023) for Diabetes F/U with A1c in office, Previsit labs.  Hulon Magic, [facility] [address] [place], [place] [address] Phone: [phone] Fax: [phone]  06/30/2023, 8:40 AM

## 2023-06-30 NOTE — Addendum Note (Signed)
 Addended by: Wanell Lorenzi J on: 06/30/2023 09:39 AM   Modules accepted: Orders

## 2023-07-05 ENCOUNTER — Other Ambulatory Visit: Payer: Self-pay | Admitting: Nurse Practitioner

## 2023-07-05 DIAGNOSIS — E1159 Type 2 diabetes mellitus with other circulatory complications: Secondary | ICD-10-CM

## 2023-07-05 MED ORDER — METFORMIN HCL ER 500 MG PO TB24: 500.0000 mg | ORAL_TABLET | Freq: Every day | ORAL | 3 refills | Status: DC

## 2023-07-06 ENCOUNTER — Telehealth: Payer: Self-pay | Admitting: *Deleted

## 2023-07-06 NOTE — Telephone Encounter (Signed)
 Good Morning, Satira Curet We received from Dr. Almon Jaegers office in [place] a note that the patient had never been seen or scheduled to be seen by Dr. Jola Nash.  Whitney ask that I reach out to you , as you may be able to look into this? We appreciate you and thank you for all your help.

## 2023-07-06 NOTE — Telephone Encounter (Signed)
 Just keeping you informed.

## 2023-07-06 NOTE — Telephone Encounter (Signed)
 I called the doctors office and spoke with Alise. She reviewed their records and she does not see a referral. She ask that it be resent to her attention,Alise at fax number [phone]. She will then get it to the person that it needs to go too.

## 2023-07-06 NOTE — Telephone Encounter (Signed)
 FYI

## 2023-07-25 ENCOUNTER — Other Ambulatory Visit: Payer: Self-pay | Admitting: Nurse Practitioner

## 2023-07-25 ENCOUNTER — Other Ambulatory Visit: Payer: Self-pay

## 2023-07-25 DIAGNOSIS — E1165 Type 2 diabetes mellitus with hyperglycemia: Secondary | ICD-10-CM

## 2023-07-25 MED ORDER — MOUNJARO 15 MG/0.5ML ~~LOC~~ SOAJ
15.0000 mg | SUBCUTANEOUS | 3 refills | Status: DC
  Filled 2023-07-25: qty 2, 28d supply, fill #0

## 2023-07-27 ENCOUNTER — Encounter: Attending: Nurse Practitioner | Admitting: Nutrition

## 2023-07-27 VITALS — Ht 73.0 in | Wt 399.0 lb

## 2023-07-27 DIAGNOSIS — E1169 Type 2 diabetes mellitus with other specified complication: Secondary | ICD-10-CM | POA: Diagnosis present

## 2023-07-27 DIAGNOSIS — E785 Hyperlipidemia, unspecified: Secondary | ICD-10-CM | POA: Insufficient documentation

## 2023-07-27 DIAGNOSIS — E118 Type 2 diabetes mellitus with unspecified complications: Secondary | ICD-10-CM | POA: Insufficient documentation

## 2023-07-27 DIAGNOSIS — Z6841 Body Mass Index (BMI) 40.0 and over, adult: Secondary | ICD-10-CM | POA: Diagnosis present

## 2023-07-27 DIAGNOSIS — I152 Hypertension secondary to endocrine disorders: Secondary | ICD-10-CM | POA: Diagnosis present

## 2023-07-27 DIAGNOSIS — E1159 Type 2 diabetes mellitus with other circulatory complications: Secondary | ICD-10-CM | POA: Insufficient documentation

## 2023-07-27 DIAGNOSIS — E66813 Obesity, class 3: Secondary | ICD-10-CM | POA: Insufficient documentation

## 2023-07-27 NOTE — Patient Instructions (Addendum)
 Goals  Work on being more consistent with meals Aim to have 2 vegetables, protein, 2-3 carb choices and fruit with each meal. Increase high fiber foods of Dave's killer bread, sweet potatoes, beans, Aim for 38 grams of fiber per day Lose 3-5 lbs per month

## 2023-07-27 NOTE — Progress Notes (Unsigned)
 Medical Nutrition Therapy  Appointment Start time:  1300  Appointment End time:  1330   Primary concerns today: Type 2 DM, Obesity  Referral diagnosis: E11.8, E66.01 Preferred learning style: NO Preference  Learning readiness: Ready    NUTRITION ASSESSMENT   8 lbs lost Changes made:    33 yr old bmale referred for Type 2 DM and Obesity Sees Hulon Magic, FNP at [facility]. Wants to get the gastric sleeve for help with weight loss. A1c 6.7%. on Mounjaro  15 mg a day. Metformin  500 mg once a day Eats 3 meals a day usually. Sometime skips meals. Has some junk food in diet. Working out in the mornings now. Wants to lose weight. Admits to some emotional eating he has had his whole life. 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  He is willing to work on changing his behaviors to make lifestyle changes to improve health and lose weight.  Clinical Medical Hx:  Past Medical History:  Diagnosis Date   Asthma    child   COVID-19    x2   Diabetes mellitus without complication (HCC)    Hyperlipidemia    Hypertension    Morbid obesity (HCC)    Sinus tachycardia     Medications:  Current Outpatient Medications on File Prior to Visit  Medication Sig Dispense Refill   atorvastatin  (LIPITOR) 20 MG tablet TAKE 1 TABLET(20 MG) BY MOUTH DAILY 90 tablet 3   Blood Glucose Monitoring Suppl (ONETOUCH VERIO FLEX SYSTEM) w/Device KIT Use to monitor glucose twice daily 1 kit 0   Cholecalciferol  1.25 MG (50000 UT) capsule Take 1 capsule (50,000 Units total) by mouth once a week. D3 d/c after 6 months 13 capsule 1   fluticasone (FLONASE) 50 MCG/ACT nasal spray Place 1 spray into both nostrils daily as needed for allergies or rhinitis.     glucose blood (ONETOUCH VERIO) test strip Use as instructed 100 each 12   hydrochlorothiazide  (HYDRODIURIL ) 25 MG tablet TAKE 1 TABLET DAILY 90 tablet 1   Lancets (ONETOUCH DELICA PLUS LANCET33G) MISC USE THREE TIMES DAILY AS DIRECTED 300 each 12    losartan  (COZAAR ) 100 MG tablet TAKE 1 TABLET(100 MG) BY MOUTH DAILY 90 tablet 3   metFORMIN  (GLUCOPHAGE -XR) 500 MG 24 hr tablet Take 1 tablet (500 mg total) by mouth daily with breakfast. 90 tablet 3   Multiple Vitamin (MULTIVITAMIN PO) Take by mouth.     ondansetron  (ZOFRAN ) 4 MG tablet Take 1 tablet (4 mg total) by mouth 2 (two) times daily as needed. 90 tablet 1   tirzepatide  (MOUNJARO ) 15 MG/0.5ML Pen Inject 15 mg into the skin once a week. 6 mL 3   No current facility-administered medications on file prior to visit.    Labs:  Lab Results  Component Value Date   HGBA1C 6.7 (A) 06/30/2023      Latest Ref Rng & Units 10/27/2022    7:33 AM 10/23/2021    9:18 AM 12/10/2020   11:07 AM  CMP  Glucose 70 - 99 mg/dL 161  096  045   BUN 6 - 23 mg/dL 12  13  12    Creatinine 0.40 - 1.50 mg/dL 4.09  8.11  9.14   Sodium 135 - 145 mEq/L 137  136  136   Potassium 3.5 - 5.1 mEq/L 4.2  4.7  4.1   Chloride 96 - 112 mEq/L 101  96  96   CO2 19 - 32 mEq/L 29  30  28  Calcium  8.4 - 10.5 mg/dL 9.1  9.4  9.5   Total Protein 6.0 - 8.3 g/dL 7.1  6.9  7.0   Total Bilirubin 0.2 - 1.2 mg/dL 0.4  0.7  0.7   Alkaline Phos 39 - 117 U/L 73  70  57   AST 0 - 37 U/L 25  22  30    ALT 0 - 53 U/L 49  36  47    Lipid Panel     Component Value Date/Time   CHOL 123 10/27/2022 0733   CHOL 220 (H) 04/23/2020 0813   TRIG 88.0 10/27/2022 0733   HDL 31.60 (L) 10/27/2022 0733   HDL 41 04/23/2020 0813   CHOLHDL 4 10/27/2022 0733   VLDL 17.6 10/27/2022 0733   LDLCALC 73 10/27/2022 0733   LDLCALC 151 (H) 04/23/2020 0813   LDLDIRECT 78.0 10/23/2021 0918   LABVLDL 28 04/23/2020 0813      Notable Signs/Symptoms: Tired often  Lifestyle & Dietary Hx Married and works FT 3 small boys  Estimated daily fluid intake: 60 oz Supplements:  Sleep: varies Stress / self-care:  Current average weekly physical activity: working out now.  24-Hr Dietary Recall Trying to eat better meals   Estimated Energy  Needs Calories: 2000-2200 Carbohydrate: 225g Protein: 150g Fat: 56g   NUTRITION DIAGNOSIS  NI-1.7 Predicted excessive energy intake As related to high calorie diet.  As evidenced by A1C 6.7% BMI 53.[name]   NUTRITION INTERVENTION  Nutrition education (E-1) on the following topics:  Nutrition and Diabetes education provided on My Plate, CHO counting, meal planning, portion sizes, timing of meals, avoiding snacks between meals unless having a low blood sugar, target ranges for A1C and blood sugars, signs/symptoms and treatment of hyper/hypoglycemia, monitoring blood sugars, taking medications as prescribed, benefits of exercising 30 minutes per day and prevention of complications of DM.  Lifestyle Medicine  - Whole Food, Plant Predominant Nutrition is highly recommended: Eat Plenty of vegetables, Mushrooms, fruits, Legumes, Whole Grains, Nuts, seeds in lieu of processed meats, processed snacks/pastries red meat, poultry, eggs.    -It is better to avoid simple carbohydrates including: Cakes, Sweet Desserts, Ice Cream, Soda (diet and regular), Sweet Tea, Candies, Chips, Cookies, Store Bought Juices, Alcohol in Excess of  1-2 drinks a day, Lemonade,  Artificial Sweeteners, Doughnuts, Coffee Creamers, Sugar-free Products, etc, etc.  This is not a complete list.....  Exercise: If you are able: 30 -60 minutes a day ,4 days a week, or 150 minutes a week.  The longer the better.  Combine stretch, strength, and aerobic activities.  If you were told in the past that you have high risk for cardiovascular diseases, you may seek evaluation by your heart doctor prior to initiating moderate to intense exercise programs.   Handouts Provided Include  Lifestyle Medicine Know your numbers   Learning Style & Readiness for Change Teaching method utilized: Visual & Auditory  Demonstrated degree of understanding via: Teach Back  Barriers to learning/adherence to lifestyle change: none  Goals Established by  Pt Goals Eat 3 meal per day at times discussed - much improved. Work on meal planning and meal prepping.-improve Keep food journal- done Drink a gallon of water per day Focus on eating fruits, vegetables and whole grains-- foods from garden Lose 1-2 lbs per week Keep working out 5 days per week.   MONITORING & EVALUATION Dietary intake, weekly physical activity, and weight and blood sugars in 1 month.  Next Steps  Patient is to work on meal planning .

## 2023-08-03 ENCOUNTER — Encounter: Payer: Self-pay | Admitting: Nutrition

## 2023-10-06 ENCOUNTER — Other Ambulatory Visit: Payer: Self-pay

## 2023-10-14 ENCOUNTER — Encounter

## 2023-10-14 DIAGNOSIS — E1159 Type 2 diabetes mellitus with other circulatory complications: Secondary | ICD-10-CM

## 2023-10-14 DIAGNOSIS — E1165 Type 2 diabetes mellitus with hyperglycemia: Secondary | ICD-10-CM

## 2023-10-14 DIAGNOSIS — E782 Mixed hyperlipidemia: Secondary | ICD-10-CM

## 2023-10-14 DIAGNOSIS — Z7985 Long-term (current) use of injectable non-insulin antidiabetic drugs: Secondary | ICD-10-CM

## 2023-10-14 DIAGNOSIS — Z7984 Long term (current) use of oral hypoglycemic drugs: Secondary | ICD-10-CM

## 2023-10-14 DIAGNOSIS — E1169 Type 2 diabetes mellitus with other specified complication: Secondary | ICD-10-CM

## 2023-10-14 DIAGNOSIS — E785 Hyperlipidemia, unspecified: Secondary | ICD-10-CM

## 2023-10-17 ENCOUNTER — Other Ambulatory Visit: Payer: Self-pay

## 2023-10-18 ENCOUNTER — Other Ambulatory Visit: Payer: Self-pay

## 2023-10-18 DIAGNOSIS — E1159 Type 2 diabetes mellitus with other circulatory complications: Secondary | ICD-10-CM

## 2023-10-18 MED ORDER — HYDROCHLOROTHIAZIDE 25 MG PO TABS: 25.0000 mg | ORAL_TABLET | Freq: Every day | ORAL | 0 refills | Status: DC

## 2023-11-03 ENCOUNTER — Encounter: Admitting: Nutrition

## 2023-11-03 ENCOUNTER — Ambulatory Visit: Admitting: Nurse Practitioner

## 2023-11-08 ENCOUNTER — Ambulatory Visit (INDEPENDENT_AMBULATORY_CARE_PROVIDER_SITE_OTHER)

## 2023-11-08 VITALS — BP 120/70 | HR 83 | Temp 98.2°F | Ht 74.0 in | Wt >= 6400 oz

## 2023-11-08 DIAGNOSIS — Z7984 Long term (current) use of oral hypoglycemic drugs: Secondary | ICD-10-CM

## 2023-11-08 DIAGNOSIS — G4733 Obstructive sleep apnea (adult) (pediatric): Secondary | ICD-10-CM

## 2023-11-08 DIAGNOSIS — J302 Other seasonal allergic rhinitis: Secondary | ICD-10-CM

## 2023-11-08 DIAGNOSIS — E1169 Type 2 diabetes mellitus with other specified complication: Secondary | ICD-10-CM | POA: Diagnosis not present

## 2023-11-08 DIAGNOSIS — E66813 Obesity, class 3: Secondary | ICD-10-CM

## 2023-11-08 DIAGNOSIS — I152 Hypertension secondary to endocrine disorders: Secondary | ICD-10-CM

## 2023-11-08 DIAGNOSIS — E782 Mixed hyperlipidemia: Secondary | ICD-10-CM

## 2023-11-08 DIAGNOSIS — E1159 Type 2 diabetes mellitus with other circulatory complications: Secondary | ICD-10-CM | POA: Diagnosis not present

## 2023-11-08 DIAGNOSIS — Z7985 Long-term (current) use of injectable non-insulin antidiabetic drugs: Secondary | ICD-10-CM

## 2023-11-08 DIAGNOSIS — E785 Hyperlipidemia, unspecified: Secondary | ICD-10-CM

## 2023-11-08 DIAGNOSIS — Z6841 Body Mass Index (BMI) 40.0 and over, adult: Secondary | ICD-10-CM

## 2023-11-08 MED ORDER — ATORVASTATIN CALCIUM 20 MG PO TABS: ORAL_TABLET | ORAL | 3 refills | Status: AC

## 2023-11-08 MED ORDER — LOSARTAN POTASSIUM 100 MG PO TABS: ORAL_TABLET | ORAL | 3 refills | Status: AC

## 2023-11-08 MED ORDER — HYDROCHLOROTHIAZIDE 25 MG PO TABS: 25.0000 mg | ORAL_TABLET | Freq: Every day | ORAL | 3 refills | Status: AC

## 2023-11-08 NOTE — Assessment & Plan Note (Signed)
 Chronic, Last A1c 6.7% on 06/30/2023. Currently managed by endocrinologist.  On Mounjaro  15mg  and Metformin  500mg  daily, tolorating both medications well, continue follow up with endocrinologist.  He has pended lab from endo including lipid panel, CMP, TSH and f/u appointment with Benton Rio, NP on 01/04/2024. He plans on repeating these labs one week before his endo appointment.  F/U in 2 months for annual physical.

## 2023-11-08 NOTE — Assessment & Plan Note (Signed)
 Chronic, reviewed lipid panel from 10/27/22 which showed LDL.  Continue Atorvastatin  20 mg, tolorating well continue. Goal LDL is <70. Schedule fasting labs as ordered by endocrinologist.

## 2023-11-08 NOTE — Assessment & Plan Note (Signed)
 Chronic, BP on arrival above goal. Repeat BP 120/70. Discussed patient's goal BP is <130/80 mmHg.  Continue hydrochlorothiazide  25 mg and losartan  100 mg daily. Refill sent.  DASH diet recommended for cardiovascular risk reduction.

## 2023-11-08 NOTE — Progress Notes (Signed)
 Established Patient Office Visit   Subjective  Patient ID: Rickey Payne, male    DOB: 15-Sep-1990  Age: 33 y.o. MRN: [id number]  Chief Complaint  Patient presents with   Establish Care    He  has a past medical history of Asthma, COVID-19, Diabetes mellitus without complication (HCC), Hyperlipidemia, Hypertension, Morbid obesity (HCC), Sinus tachycardia, and Vitamin D  deficiency (05/20/2017).  HPI Discussed the use of AI scribe software for clinical note transcription with the patient, who gave verbal consent to proceed.  History of Present Illness Rickey Payne is a 33 year old male with type 2 diabetes and hypertension who presents for a follow-up visit.  He has type 2 diabetes, diagnosed in 2018, managed with Mounjaro  weekly and metformin  500 mg once daily. He experiences occasional nausea, for which he uses Zofran  as needed. He monitors his blood glucose at home. He is under the care of an endocrinologist with an upcoming appointment on November 26th, and labs scheduled a week prior.  He has hypertension, managed with hydrochlorothiazide  25 mg and losartan  100 mg daily, both taken with breakfast. He monitors his blood pressure at home. No chest pain, palpitations, or new swelling in his legs.  He has a history of high cholesterol, managed with a cholesterol-lowering medication, 20 mg daily. He also takes a daily multivitamin and previously took vitamin D  weekly due to working night shifts, but has since stopped as his levels normalized.  He experiences seasonal allergies from mid-March to June, using Flonase and Claritin , both over the counter, during this period.  He has a history of sleep apnea, diagnosed around 2019-2020, but has not been prescribed a CPAP machine. He reports snoring only when drinking alcohol, which is infrequent. He sleeps elevated with multiple pillows and feels refreshed upon waking. He transitioned from night to day shifts about two to three years ago,  improving his sleep pattern.  He is actively working on weight loss. He exercises regularly, doing cardio every morning, and works part-time at a [employer], which keeps him active. He needs to eat more regularly to manage his diabetes better, as he currently eats only once a day.  ROS As per HPI    Objective:     BP 120/70 (BP Location: Right Arm, Cuff Size: Large)   Pulse 83   Temp 98.2 F (36.8 C) (Oral)   Ht 6' 2 (1.88 m)   Wt (!) 401 lb 3.2 oz (182 kg)   SpO2 97%   BMI 51.51 kg/m      11/08/2023    2:14 PM 11/09/2022   11:29 AM 04/28/2022   10:59 AM  Depression screen PHQ 2/9  Decreased Interest 0 0 0  Down, Depressed, Hopeless 0 0 0  PHQ - 2 Score 0 0 0  Altered sleeping 0 0   Tired, decreased energy 0 0   Change in appetite 0 2   Feeling bad or failure about yourself  0 0   Trouble concentrating 0 0   Moving slowly or fidgety/restless 0 0   Suicidal thoughts 0 0   PHQ-9 Score 0 2   Difficult doing work/chores Not difficult at all Not difficult at all       11/08/2023    2:14 PM 11/09/2022   11:30 AM 04/28/2022   10:59 AM 04/23/2020    7:34 AM  GAD 7 : Generalized Anxiety Score  Nervous, Anxious, on Edge 0 0 0 0  Control/stop worrying 1 0 0 0  Worry too much - different things 1 0 0 0  Trouble relaxing 0 0 0 0  Restless 0 0 0 0  Easily annoyed or irritable 0 0 0 0  Afraid - awful might happen 0 0 0 0  Total GAD 7 Score 2 0 0 0  Anxiety Difficulty Not difficult at all Not difficult at all Not difficult at all Not difficult at all      11/08/2023    2:14 PM 11/09/2022   11:29 AM 04/28/2022   10:59 AM  Depression screen PHQ 2/9  Decreased Interest 0 0 0  Down, Depressed, Hopeless 0 0 0  PHQ - 2 Score 0 0 0  Altered sleeping 0 0   Tired, decreased energy 0 0   Change in appetite 0 2   Feeling bad or failure about yourself  0 0   Trouble concentrating 0 0   Moving slowly or fidgety/restless 0 0   Suicidal thoughts 0 0   PHQ-9 Score 0 2    Difficult doing work/chores Not difficult at all Not difficult at all       11/08/2023    2:14 PM 11/09/2022   11:30 AM 04/28/2022   10:59 AM 04/23/2020    7:34 AM  GAD 7 : Generalized Anxiety Score  Nervous, Anxious, on Edge 0 0 0 0  Control/stop worrying 1 0 0 0  Worry too much - different things 1 0 0 0  Trouble relaxing 0 0 0 0  Restless 0 0 0 0  Easily annoyed or irritable 0 0 0 0  Afraid - awful might happen 0 0 0 0  Total GAD 7 Score 2 0 0 0  Anxiety Difficulty Not difficult at all Not difficult at all Not difficult at all Not difficult at all   SDOH Screenings   Depression (PHQ2-9): Low Risk  (11/08/2023)  Financial Resource Strain: Medium Risk (10/15/2020)  Tobacco Use: Low Risk  (11/08/2023)     Physical Exam Constitutional:      Appearance: He is obese.  HENT:     Head: Normocephalic and atraumatic.     Left Ear: Tympanic membrane normal.     Mouth/Throat:     Mouth: Mucous membranes are moist.     Pharynx: Oropharynx is clear.     Tonsils: No tonsillar exudate or tonsillar abscesses. 3+ on the right. 3+ on the left.  Cardiovascular:     Rate and Rhythm: Normal rate.  Pulmonary:     Effort: Pulmonary effort is normal.     Breath sounds: Normal breath sounds.  Abdominal:     General: Abdomen is protuberant. Bowel sounds are normal.     Palpations: Abdomen is soft.  Musculoskeletal:     Cervical back: No rigidity.     Right lower leg: No edema.     Left lower leg: No edema.  Lymphadenopathy:     Cervical: No cervical adenopathy.  Neurological:     Mental Status: He is alert and oriented to person, place, and time.  Psychiatric:        Mood and Affect: Mood normal.        No results found for any visits on 11/08/23.  The ASCVD Risk score (Arnett DK, et al., 2019) failed to calculate for the following reasons:   The 2019 ASCVD risk score is only valid for ages 29 to 51     Assessment & Plan:   Class 3 severe obesity due to excess calories with  serious comorbidity and body mass index (BMI) of 50.0 to 59.9 in adult Assessment & Plan: Reviewed weight trend with the patient. Weight reduced from 407 lbs to 401 lbs compared to his last endocrine visit in May 2025.  Engaged in exercise and dietary modifications.  Surgical options not pursued due to cost, was previously seen at [facility] weight loss clinic.     Hypertension associated with diabetes (HCC) Assessment & Plan: Chronic, BP on arrival above goal. Repeat BP 120/70. Discussed patient's goal BP is <130/80 mmHg.  Continue hydrochlorothiazide  25 mg and losartan  100 mg daily. Refill sent.  DASH diet recommended for cardiovascular risk reduction.   Orders: -     hydroCHLOROthiazide ; Take 1 tablet (25 mg total) by mouth daily.  Dispense: 30 tablet; Refill: 3 -     Losartan  Potassium; TAKE 1 TABLET(100 MG) BY MOUTH DAILY  Dispense: 90 tablet; Refill: 3  Type 2 diabetes mellitus with other specified complication, without long-term current use of insulin (HCC) Assessment & Plan: Chronic, Last A1c 6.7% on 06/30/2023. Currently managed by endocrinologist.  On Mounjaro  15mg  and Metformin  500mg  daily, tolorating both medications well, continue follow up with endocrinologist.  He has pended lab from endo including lipid panel, CMP, TSH and f/u appointment with Benton Rio, NP on 01/04/2024. He plans on repeating these labs one week before his endo appointment.  F/U in 2 months for annual physical.    Hyperlipidemia associated with type 2 diabetes mellitus (HCC) Assessment & Plan: Chronic, reviewed lipid panel from 10/27/22 which showed LDL.  Continue Atorvastatin  20 mg, tolorating well continue. Goal LDL is <70. Schedule fasting labs as ordered by endocrinologist.    OSA (obstructive sleep apnea) Assessment & Plan: Untreated obstructive sleep apnea with snoring when he drinks alcohol.   B/L tonsillar hypertrophy noted.  Advised on cardiovascular risks and encouraged specialist  evaluation. He kindly declined referral to sleep specialist today.  Provide information on sleep apnea. Will continue to discuss this in the future.     Seasonal allergies Assessment & Plan: Stable with prn nasal Flonase and Claritin  which he gets OTC, continue.    Other orders -     Atorvastatin  Calcium ; TAKE 1 TABLET(20 MG) BY MOUTH DAILY  Dispense: 90 tablet; Refill: 3    Return in about 8 weeks (around 01/03/2024) for Annual physical with Dr. Abbey.   Luke Abbey, MD

## 2023-11-08 NOTE — Patient Instructions (Signed)
 Let me know if you are interested in reevaluation into sleep apnea. Let me know and I will refer you to sleep specialist at [facility] pulmonology clinic.   Your blood pressure goal is less than 130/80 mmHg. I am attaching information on DASH diet to help with blood pressure. Please continue taking your current medications as it is.

## 2023-11-08 NOTE — Assessment & Plan Note (Signed)
 Untreated obstructive sleep apnea with snoring when he drinks alcohol.   B/L tonsillar hypertrophy noted.  Advised on cardiovascular risks and encouraged specialist evaluation. He kindly declined referral to sleep specialist today.  Provide information on sleep apnea. Will continue to discuss this in the future.

## 2023-11-08 NOTE — Assessment & Plan Note (Signed)
 Reviewed weight trend with the patient. Weight reduced from 407 lbs to 401 lbs compared to his last endocrine visit in May 2025.  Engaged in exercise and dietary modifications.  Surgical options not pursued due to cost, was previously seen at [facility] weight loss clinic.

## 2023-11-08 NOTE — Assessment & Plan Note (Signed)
 Stable with prn nasal Flonase and Claritin  which he gets OTC, continue.

## 2023-11-11 ENCOUNTER — Encounter: Payer: Managed Care, Other (non HMO) | Admitting: Family Medicine

## 2023-11-16 ENCOUNTER — Encounter: Admitting: Nurse Practitioner

## 2023-11-17 ENCOUNTER — Other Ambulatory Visit: Payer: Self-pay | Admitting: Internal Medicine

## 2023-11-17 DIAGNOSIS — E1159 Type 2 diabetes mellitus with other circulatory complications: Secondary | ICD-10-CM

## 2023-12-19 ENCOUNTER — Telehealth: Payer: Self-pay

## 2023-12-19 NOTE — Telephone Encounter (Signed)
 This patient has lab ordered from endocrinology Merri Rio, NP) as a result I am not ordering any labs. The lab request should go to his endocrinologist.   Thank you, Luke Shade, MD

## 2023-12-19 NOTE — Telephone Encounter (Signed)
 Lab order needed

## 2023-12-26 ENCOUNTER — Encounter: Payer: Self-pay | Admitting: Nurse Practitioner

## 2023-12-28 ENCOUNTER — Other Ambulatory Visit

## 2023-12-28 ENCOUNTER — Other Ambulatory Visit: Payer: Self-pay | Admitting: Nurse Practitioner

## 2023-12-28 DIAGNOSIS — E559 Vitamin D deficiency, unspecified: Secondary | ICD-10-CM

## 2023-12-28 DIAGNOSIS — E1165 Type 2 diabetes mellitus with hyperglycemia: Secondary | ICD-10-CM

## 2023-12-28 NOTE — Telephone Encounter (Signed)
 Do you know what he means by this?  Do we just need to fax an order?

## 2023-12-28 NOTE — Telephone Encounter (Signed)
 I changed it to future but I really dont understand why they would need it as that.  I saw a message that his PCP sent to him saying he can just go to a [facility] location.

## 2023-12-29 ENCOUNTER — Other Ambulatory Visit

## 2023-12-29 DIAGNOSIS — E559 Vitamin D deficiency, unspecified: Secondary | ICD-10-CM

## 2023-12-29 DIAGNOSIS — E1165 Type 2 diabetes mellitus with hyperglycemia: Secondary | ICD-10-CM

## 2023-12-30 LAB — COMPREHENSIVE METABOLIC PANEL WITH GFR
ALT: 50 IU/L — ABNORMAL HIGH (ref 0–44)
AST: 37 IU/L (ref 0–40)
Albumin: 4.5 g/dL (ref 4.1–5.1)
Alkaline Phosphatase: 70 IU/L (ref 47–123)
BUN/Creatinine Ratio: 17 (ref 9–20)
BUN: 12 mg/dL (ref 6–20)
Bilirubin Total: 0.4 mg/dL (ref 0.0–1.2)
CO2: 26 mmol/L (ref 20–29)
Calcium: 9.5 mg/dL (ref 8.7–10.2)
Chloride: 101 mmol/L (ref 96–106)
Creatinine, Ser: 0.7 mg/dL — ABNORMAL LOW (ref 0.76–1.27)
Globulin, Total: 2.5 g/dL (ref 1.5–4.5)
Glucose: 99 mg/dL (ref 70–99)
Potassium: 4.5 mmol/L (ref 3.5–5.2)
Sodium: 141 mmol/L (ref 134–144)
Total Protein: 7 g/dL (ref 6.0–8.5)
eGFR: 125 mL/min/1.73 (ref >59)

## 2023-12-30 LAB — LIPID PANEL
Chol/HDL Ratio: 4.2 ratio (ref 0.0–5.0)
Cholesterol, Total: 163 mg/dL (ref 100–199)
HDL: 39 mg/dL — ABNORMAL LOW (ref >39)
LDL Chol Calc (NIH): 106 mg/dL — ABNORMAL HIGH (ref 0–99)
Triglycerides: 97 mg/dL (ref 0–149)
VLDL Cholesterol Cal: 18 mg/dL (ref 5–40)

## 2023-12-30 LAB — TSH: TSH: 1.39 u[IU]/mL (ref 0.450–4.500)

## 2023-12-30 LAB — VITAMIN D 25 HYDROXY (VIT D DEFICIENCY, FRACTURES): Vit D, 25-Hydroxy: 21.7 ng/mL — ABNORMAL LOW (ref 30.0–100.0)

## 2023-12-30 LAB — T4, FREE: Free T4: 0.89 ng/dL (ref 0.82–1.77)

## 2024-01-04 ENCOUNTER — Ambulatory Visit: Admitting: Nutrition

## 2024-01-04 ENCOUNTER — Ambulatory Visit (INDEPENDENT_AMBULATORY_CARE_PROVIDER_SITE_OTHER): Admitting: Nurse Practitioner

## 2024-01-04 ENCOUNTER — Encounter: Payer: Self-pay | Admitting: Nurse Practitioner

## 2024-01-04 VITALS — BP 114/76 | HR 61 | Ht 74.0 in | Wt >= 6400 oz

## 2024-01-04 DIAGNOSIS — E1159 Type 2 diabetes mellitus with other circulatory complications: Secondary | ICD-10-CM | POA: Diagnosis not present

## 2024-01-04 DIAGNOSIS — E785 Hyperlipidemia, unspecified: Secondary | ICD-10-CM

## 2024-01-04 DIAGNOSIS — E1165 Type 2 diabetes mellitus with hyperglycemia: Secondary | ICD-10-CM | POA: Diagnosis not present

## 2024-01-04 DIAGNOSIS — E559 Vitamin D deficiency, unspecified: Secondary | ICD-10-CM

## 2024-01-04 DIAGNOSIS — I10 Essential (primary) hypertension: Secondary | ICD-10-CM

## 2024-01-04 DIAGNOSIS — Z7985 Long-term (current) use of injectable non-insulin antidiabetic drugs: Secondary | ICD-10-CM

## 2024-01-04 DIAGNOSIS — Z7984 Long term (current) use of oral hypoglycemic drugs: Secondary | ICD-10-CM

## 2024-01-04 DIAGNOSIS — I152 Hypertension secondary to endocrine disorders: Secondary | ICD-10-CM

## 2024-01-04 LAB — POCT GLYCOSYLATED HEMOGLOBIN (HGB A1C): Hemoglobin A1C: 7 % — AB (ref 4.0–5.6)

## 2024-01-04 LAB — POCT UA - MICROALBUMIN

## 2024-01-04 MED ORDER — TIRZEPATIDE 15 MG/0.5ML ~~LOC~~ SOAJ: 15.0000 mg | SUBCUTANEOUS | 3 refills | Status: AC

## 2024-01-04 MED ORDER — VITAMIN D (ERGOCALCIFEROL) 1.25 MG (50000 UNIT) PO CAPS: 50000.0000 [IU] | ORAL_CAPSULE | ORAL | 1 refills | Status: AC

## 2024-01-04 MED ORDER — METFORMIN HCL ER 500 MG PO TB24: 500.0000 mg | ORAL_TABLET | Freq: Every day | ORAL | 3 refills | Status: AC

## 2024-01-04 NOTE — Progress Notes (Signed)
 Endocrinology Follow Up Note       01/04/2024, 9:06 AM   Subjective:    Patient ID: Rickey Payne, male    DOB: 1990/09/15.  Rickey Payne is being seen in follow up after being seen in consultation for management of currently uncontrolled symptomatic diabetes requested by  Bair, Kalpana, MD.   Past Medical History:  Diagnosis Date   Asthma    child   COVID-19    x2   Diabetes mellitus without complication (HCC)    Hyperlipidemia    Hypertension    Morbid obesity (HCC)    Sinus tachycardia    Vitamin D  deficiency 05/20/2017    Past Surgical History:  Procedure Laterality Date   right wrist fracture      Social History   Socioeconomic History   Marital status: Married    Spouse name: Not on file   Number of children: Not on file   Years of education: Not on file   Highest education level: Not on file  Occupational History   Not on file  Tobacco Use   Smoking status: Never   Smokeless tobacco: Never  Substance and Sexual Activity   Alcohol use: Yes    Alcohol/week: 1.0 standard drink of alcohol    Types: 1 Cans of beer per week    Comment: occasional   Drug use: No   Sexual activity: Yes  Other Topics Concern   Not on file  Social History Narrative   Diploma, deputy detention officer    Married    2 kids 7 y.o boy, 6 month boy, wife pregnant as of 06/20/19    -as of 01/23/20 pt has 3 boys       Drinks occasionally    Never smoker, no chew    Owns guns, wears seat belt, safe in relationship       Social Drivers of Health   Financial Resource Strain: Medium Risk (10/15/2020)   Overall Financial Resource Strain (CARDIA)    Difficulty of Paying Living Expenses: Somewhat hard  Food Insecurity: Not on file  Transportation Needs: Not on file  Physical Activity: Not on file  Stress: Not on file  Social Connections: Not on file    Family History  Problem Relation Age of Onset    Hypertension Mother    Arthritis Mother    Asthma Mother    Depression Mother    Hyperlipidemia Mother    Diabetes Mother    Hypertension Father    Depression Father    Hyperlipidemia Father    Intellectual disability Father    Stroke Father    Hepatitis C Father    Kidney failure Father        s/p transplant    Heart disease Maternal Grandfather    Diabetes Paternal Grandmother    Drug abuse Paternal Grandmother    Diabetes Maternal Grandmother     Outpatient Encounter Medications as of 01/04/2024  Medication Sig   atorvastatin  (LIPITOR) 20 MG tablet TAKE 1 TABLET(20 MG) BY MOUTH DAILY   Blood Glucose Monitoring Suppl (ONETOUCH VERIO FLEX SYSTEM) w/Device KIT Use to monitor glucose twice daily   fluticasone (FLONASE) 50 MCG/ACT nasal spray  Place 1 spray into both nostrils daily as needed for allergies or rhinitis.   glucose blood (ONETOUCH VERIO) test strip Use as instructed   hydrochlorothiazide  (HYDRODIURIL ) 25 MG tablet Take 1 tablet (25 mg total) by mouth daily.   Lancets (ONETOUCH DELICA PLUS LANCET33G) MISC USE THREE TIMES DAILY AS DIRECTED   losartan  (COZAAR ) 100 MG tablet TAKE 1 TABLET(100 MG) BY MOUTH DAILY   Multiple Vitamin (MULTIVITAMIN PO) Take by mouth.   ondansetron  (ZOFRAN ) 4 MG tablet Take 1 tablet (4 mg total) by mouth 2 (two) times daily as needed.   Vitamin D , Ergocalciferol , (DRISDOL ) 1.25 MG (50000 UNIT) CAPS capsule Take 1 capsule (50,000 Units total) by mouth every 7 (seven) days.   [DISCONTINUED] metFORMIN  (GLUCOPHAGE -XR) 500 MG 24 hr tablet Take 1 tablet (500 mg total) by mouth daily with breakfast.   [DISCONTINUED] tirzepatide  (MOUNJARO ) 15 MG/0.5ML Pen Inject 15 mg into the skin once a week.   metFORMIN  (GLUCOPHAGE -XR) 500 MG 24 hr tablet Take 1 tablet (500 mg total) by mouth daily with breakfast.   tirzepatide  (MOUNJARO ) 15 MG/0.5ML Pen Inject 15 mg into the skin once a week.   No facility-administered encounter medications on file as of 01/04/2024.     ALLERGIES: No Known Allergies  VACCINATION STATUS: Immunization History  Administered Date(s) Administered   DTP 11/21/1992   HIB, Unspecified 11/21/1992   Hep B, Unspecified 11/01/2001, 12/22/2001, 06/01/2002   Hepatitis A, Adult 12/29/2017   Hepb-cpg 04/05/2018, 05/10/2018   Influenza-Unspecified 06/08/2020   MMR 11/21/1992, 04/19/2018   Moderna SARS-COV2 Booster Vaccination 06/08/2020   Moderna Sars-Covid-2 Vaccination 07/08/2019, 07/22/2019   OPV 11/21/1992   Tdap 01/23/2020    Diabetes He presents for his follow-up diabetic visit. He has type 2 diabetes mellitus. Onset time: Diagnosed at approx age of 33. His disease course has been stable. There are no hypoglycemic associated symptoms. Pertinent negatives for diabetes include no weight loss. There are no hypoglycemic complications. There are no diabetic complications. Risk factors for coronary artery disease include diabetes mellitus, obesity, male sex, hypertension, family history and dyslipidemia. Current diabetic treatment includes diet and oral agent (monotherapy) (and Mounjaro ). He is compliant with treatment most of the time. His weight is fluctuating minimally. He is following a generally healthy diet. When asked about meal planning, he reported none. He has not had a previous visit with a dietitian. He participates in exercise daily. (He presents today with no meter or logs to review.  He does not monitor glucose routinely due to safe regimen.  His POCT A1c today is 7%, increasing from last visit of 6.7%.  He has tolerated the Mounjaro  well.  He continues to work out at least 3 days per week.  He did look in to bariatric weight loss surgery but it was too costly.) An ACE inhibitor/angiotensin II receptor blocker is being taken. He does not see a podiatrist.Eye exam is current.    Review of systems  Constitutional: +increasing body weight,  current Body mass index is 52.64 kg/m. , no fatigue, no subjective hyperthermia,  no subjective hypothermia Eyes: no blurry vision, no xerophthalmia ENT: no sore throat, no nodules palpated in throat, no dysphagia/odynophagia, no hoarseness Cardiovascular: no chest pain, no shortness of breath, no palpitations, no leg swelling Respiratory: no cough, no shortness of breath Gastrointestinal: no nausea/vomiting/diarrhea Musculoskeletal: no muscle/joint aches Skin: no rashes, no hyperemia Neurological: no tremors, no numbness, no tingling, no dizziness Psychiatric: no depression, no anxiety  Objective:     BP 114/76 (BP Location:  Left Arm, Patient Position: Sitting, Cuff Size: Large)   Pulse 61   Ht 6' 2 (1.88 m)   Wt (!) 410 lb (186 kg)   BMI 52.64 kg/m   Wt Readings from Last 3 Encounters:  01/04/24 (!) 410 lb (186 kg)  11/08/23 (!) 401 lb 3.2 oz (182 kg)  07/27/23 (!) 399 lb (181 kg)     BP Readings from Last 3 Encounters:  01/04/24 114/76  11/08/23 120/70  06/30/23 110/80       Physical Exam- Limited  Constitutional:  Body mass index is 52.64 kg/m. , not in acute distress, normal state of mind Eyes:  EOMI, no exophthalmos Musculoskeletal: no gross deformities, strength intact in all four extremities, no gross restriction of joint movements Skin:  no rashes, no hyperemia Neurological: no tremor with outstretched hands   Diabetic Foot Exam - Simple   No data filed     CMP ( most recent) CMP     Component Value Date/Time   NA 141 12/29/2023 0000   K 4.5 12/29/2023 0000   CL 101 12/29/2023 0000   CO2 26 12/29/2023 0000   GLUCOSE 99 12/29/2023 0000   GLUCOSE 120 (H) 10/27/2022 0733   BUN 12 12/29/2023 0000   CREATININE 0.70 (L) 12/29/2023 0000   CALCIUM  9.5 12/29/2023 0000   PROT 7.0 12/29/2023 0000   ALBUMIN 4.5 12/29/2023 0000   AST 37 12/29/2023 0000   ALT 50 (H) 12/29/2023 0000   ALKPHOS 70 12/29/2023 0000   BILITOT 0.4 12/29/2023 0000   GFRNONAA 133 06/20/2019 1056   GFRAA 154 06/20/2019 1056     Diabetic Labs (most  recent): Lab Results  Component Value Date   HGBA1C 7.0 (A) 01/04/2024   HGBA1C 6.7 (A) 06/30/2023   HGBA1C 6.7 (A) 02/25/2023   MICROALBUR 30mg /L 01/04/2024   MICROALBUR 2.4 10/23/2021   MICROALBUR 11.6 01/23/2020     Lipid Panel ( most recent) Lipid Panel     Component Value Date/Time   CHOL 163 12/29/2023 0000   TRIG 97 12/29/2023 0000   HDL 39 (L) 12/29/2023 0000   CHOLHDL 4.2 12/29/2023 0000   CHOLHDL 4 10/27/2022 0733   VLDL 17.6 10/27/2022 0733   LDLCALC 106 (H) 12/29/2023 0000   LDLDIRECT 78.0 10/23/2021 0918   LABVLDL 18 12/29/2023 0000      Lab Results  Component Value Date   TSH 1.390 12/29/2023   TSH 1.54 10/27/2022   TSH 2.14 12/10/2020   TSH 1.420 06/20/2019   TSH 2.08 03/01/2018   TSH 1.600 03/03/2017   FREET4 0.89 12/29/2023   FREET4 0.82 03/01/2018           Assessment & Plan:   1) Type 2 diabetes mellitus with hyperglycemia, without long-term current use of insulin (HCC)  He presents today with no meter or logs to review.  He does not monitor glucose routinely due to safe regimen.  His POCT A1c today is 7%, increasing from last visit of 6.7%.  He has tolerated the Mounjaro  well.  He continues to work out at least 3 days per week.  He did look in to bariatric weight loss surgery but it was too costly.  GLENWOOD Rickey LILLETTE Georgina has currently uncontrolled symptomatic type 2 DM since 33 years of age.   -Recent labs reviewed.  POCT UM today was normal.  Recent LFTs were slightly elevated, will repeat prior to next visit.  - I had a long discussion with him about the progressive nature of  diabetes and the pathology behind its complications. -his diabetes is not currently complicated but he remains at a high risk for more acute and chronic complications which include CAD, CVA, CKD, retinopathy, and neuropathy. These are all discussed in detail with him.  The following Lifestyle Medicine recommendations according to American College of Lifestyle Medicine  [facility]) were discussed and offered to patient and he agrees to start the journey:  A. Whole Foods, Plant-based plate comprising of fruits and vegetables, plant-based proteins, whole-grain carbohydrates was discussed in detail with the patient.   A list for source of those nutrients were also provided to the patient.  Patient will use only water or unsweetened tea for hydration. B.  The need to stay away from risky substances including alcohol, smoking; obtaining 7 to 9 hours of restorative sleep, at least 150 minutes of moderate intensity exercise weekly, the importance of healthy social connections,  and stress reduction techniques were discussed. C.  A full color page of  Calorie density of various food groups per pound showing examples of each food groups was provided to the patient.  - Nutritional counseling repeated/built upon at each appointment.  - The patient admits there is a room for improvement in their diet and drink choices. -  Suggestion is made for the patient to avoid simple carbohydrates from their diet including Cakes, Sweet Desserts / Pastries, Ice Cream, Soda (diet and regular), Sweet Tea, Candies, Chips, Cookies, Sweet Pastries, Store Bought Juices, Alcohol in Excess of 1-2 drinks a day, Artificial Sweeteners, Coffee Creamer, and Sugar-free Products. This will help patient to have stable blood glucose profile and potentially avoid unintended weight gain.   - I encouraged the patient to switch to unprocessed or minimally processed complex starch and increased protein intake (animal or plant source), fruits, and vegetables.   - Patient is advised to stick to a routine mealtimes to eat 3 meals a day and avoid unnecessary snacks (to snack only to correct hypoglycemia).  - I have approached him with the following individualized plan to manage his diabetes and patient agrees:   -He is advised to continue his Mounjaro  15 mg SQ weekly and continue Metformin  500 mg ER daily with  breakfast.  -he is encouraged to monitoring glucose once a week, before breakfast.  - Adjustment parameters are given to him for hypo and hyperglycemia in writing.  - Specific targets for  A1c; LDL, HDL, and Triglycerides were discussed with the patient.  2) Blood Pressure /Hypertension:  his blood pressure is controlled to target.   he is advised to continue his current medications including Losartan  100 mg p.o. daily with breakfast.  3) Lipids/Hyperlipidemia:    Review of his recent lipid panel from 12/29/23 showed uncontrolled LDL at 106.  he is advised to continue Lipitor 20 mg daily at bedtime.  Side effects and precautions discussed with him.    4)  Weight/Diet:  his Body mass index is 52.64 kg/m.  -  clearly complicating his diabetes care.   he is a candidate for weight loss. I discussed with him the fact that loss of 5 - 10% of his  current body weight will have the most impact on his diabetes management.  Exercise, and detailed carbohydrates information provided  -  detailed on discharge instructions.  He has recently GAINED some weight, will benefit from continued weight loss attempts.  He notes he is considering sleeve gastrectomy.  Notes his mom had this done and his sister will be having it done in  July with Dr. Jerilee at [facility].  5) Chronic Care/Health Maintenance: -he is on ACEI/ARB and Statin medications and is encouraged to initiate and continue to follow up with Ophthalmology, Dentist, Podiatrist at least yearly or according to recommendations, and advised to stay away from smoking. I have recommended yearly flu vaccine and pneumonia vaccine at least every 5 years; moderate intensity exercise for up to 150 minutes weekly; and sleep for at least 7 hours a day.  6) Vitamin D  deficiency His recent vitamin D  level from 12/29/23 was low at 21.7.  He is not currently on any supplementation.  Will start him on Ergocalciferol  50000 units po weekly x 24 weeks.  Once he completes this, he  can switch to OTC Vitamin D  3 5000 units daily as maintenance dose.  - he is advised to maintain close follow up with Bair, Kalpana, MD for primary care needs, as well as his other providers for optimal and coordinated care.     I spent  44  minutes in the care of the patient today including review of labs from CMP, Lipids, Thyroid Function, Hematology (current and previous including abstractions from other facilities); face-to-face time discussing  his blood glucose readings/logs, discussing hypoglycemia and hyperglycemia episodes and symptoms, medications doses, his options of short and long term treatment based on the latest standards of care / guidelines;  discussion about incorporating lifestyle medicine;  and documenting the encounter. Risk reduction counseling performed per USPSTF guidelines to reduce obesity and cardiovascular risk factors.     Please refer to Patient Instructions for Blood Glucose Monitoring and Insulin/Medications Dosing Guide  in media tab for additional information. Please  also refer to  Patient Self Inventory in the Media  tab for reviewed elements of pertinent patient history.  Rickey Payne participated in the discussions, expressed understanding, and voiced agreement with the above plans.  All questions were answered to his satisfaction. he is encouraged to contact clinic should he have any questions or concerns prior to his return visit.     Follow up plan: - Return in about 4 months (around 05/03/2024) for Bring meter and logs, Diabetes F/U with A1c in office, Previsit labs.  Benton Rio, [facility] [address] [place], [place] [address] Phone: [phone] Fax: [phone]  01/04/2024, 9:06 AM

## 2024-01-11 ENCOUNTER — Encounter: Attending: Nurse Practitioner | Admitting: Nutrition

## 2024-01-11 ENCOUNTER — Encounter

## 2024-02-28 ENCOUNTER — Other Ambulatory Visit: Payer: Self-pay

## 2024-02-28 ENCOUNTER — Emergency Department
Admission: EM | Admit: 2024-02-28 | Discharge: 2024-02-28 | Disposition: A | Discharge status: Home / Self Care | Attending: Emergency Medicine | Admitting: Emergency Medicine

## 2024-02-28 DIAGNOSIS — I10 Essential (primary) hypertension: Secondary | ICD-10-CM | POA: Insufficient documentation

## 2024-02-28 DIAGNOSIS — E119 Type 2 diabetes mellitus without complications: Secondary | ICD-10-CM | POA: Insufficient documentation

## 2024-02-28 DIAGNOSIS — J020 Streptococcal pharyngitis: Secondary | ICD-10-CM | POA: Diagnosis not present

## 2024-02-28 DIAGNOSIS — J45909 Unspecified asthma, uncomplicated: Secondary | ICD-10-CM | POA: Insufficient documentation

## 2024-02-28 DIAGNOSIS — R509 Fever, unspecified: Secondary | ICD-10-CM | POA: Diagnosis present

## 2024-02-28 LAB — GROUP A STREP BY PCR: Group A Strep by PCR: DETECTED — AB

## 2024-02-28 LAB — RESP PANEL BY RT-PCR (RSV, FLU A&B, COVID)  RVPGX2
Influenza A by PCR: NEGATIVE
Influenza B by PCR: NEGATIVE
Resp Syncytial Virus by PCR: NEGATIVE
SARS Coronavirus 2 by RT PCR: NEGATIVE

## 2024-02-28 MED ORDER — KETOROLAC TROMETHAMINE 15 MG/ML IJ SOLN
15.0000 mg | Freq: Once | INTRAMUSCULAR | Status: AC
  Administered 2024-02-28: 15 mg via INTRAMUSCULAR
  Filled 2024-02-28: qty 1

## 2024-02-28 MED ORDER — AMOXICILLIN 500 MG PO TABS: 500.0000 mg | ORAL_TABLET | Freq: Two times a day (BID) | ORAL | 0 refills | Status: AC

## 2024-02-28 MED ORDER — DEXAMETHASONE SOD PHOSPHATE PF 10 MG/ML IJ SOLN
10.0000 mg | Freq: Once | INTRAMUSCULAR | Status: AC
  Administered 2024-02-28: 10 mg via INTRAMUSCULAR
  Filled 2024-02-28: qty 1

## 2024-02-28 NOTE — ED Triage Notes (Signed)
 Pt to ED for sore throat, fever, bodyaches started Saturday. +sick contacts

## 2024-02-28 NOTE — ED Provider Notes (Signed)
 "  [facility] Provider Note    Event Date/Time   First MD Initiated Contact with Patient 02/28/24 [phone]     (approximate)   History   Sore Throat   HPI  Rickey Payne is a 34 y.o. male with PMH of diabetes, OSA, hypertension, obesity, asthma presents for evaluation of sore throat, fever and bodyaches that began on Saturday.  Patient does have known sick contacts.  He tried taking Tylenol for treatment of pain.  Reports pain with swallowing.      Physical Exam   Triage Vital Signs: ED Triage Vitals  Encounter Vitals Group     BP 02/28/24 0726 137/79     Girls Systolic BP Percentile --      Girls Diastolic BP Percentile --      Boys Systolic BP Percentile --      Boys Diastolic BP Percentile --      Pulse Rate 02/28/24 0726 (!) 103     Resp 02/28/24 0726 20     Temp 02/28/24 0726 98.6 F (37 C)     Temp src --      SpO2 02/28/24 0726 96 %     Weight 02/28/24 0724 (!) 410 lb (186 kg)     Height 02/28/24 0724 6' 2 (1.88 m)     Head Circumference --      Peak Flow --      Pain Score 02/28/24 0724 9     Pain Loc --      Pain Education --      Exclude from Growth Chart --     Most recent vital signs: Vitals:   02/28/24 0726  BP: 137/79  Pulse: (!) 103  Resp: 20  Temp: 98.6 F (37 C)  SpO2: 96%   General: Awake, no distress.  CV:  Good peripheral perfusion.  RRR. Resp:  Normal effort.  CTAB Abd:  No distention.  Other:  Oral mucous membranes are moist, pharynx is erythematous, bilateral tonsils are enlarged with white exudate, no trismus   ED Results / Procedures / Treatments   Labs (all labs ordered are listed, but only abnormal results are displayed) Labs Reviewed  GROUP A STREP BY PCR - Abnormal; Notable for the following components:      Result Value   Group A Strep by PCR DETECTED (*)    All other components within normal limits  RESP PANEL BY RT-PCR (RSV, FLU A&B, COVID)  RVPGX2     PROCEDURES:  Critical Care  performed: No  Procedures   MEDICATIONS ORDERED IN ED: Medications  ketorolac  (TORADOL ) 15 MG/ML injection 15 mg (has no administration in time range)  dexamethasone  (DECADRON ) injection 10 mg (has no administration in time range)     IMPRESSION / MDM / ASSESSMENT AND PLAN / ED COURSE  I reviewed the triage vital signs and the nursing notes.                             34 year old male presents for evaluation of sore throat.  Patient was tachycardic on initial presentation which I suspect is due to pain.  Vital signs stable otherwise.  Differential diagnosis includes, but is not limited to, flu, COVID, RSV, strep pharyngitis, other viral URI.  Patient's presentation is most consistent with acute complicated illness / injury requiring diagnostic workup.  Respiratory panel is negative.  Strep test is positive.  Tonsils enlarged on exam but no trismus.  Do not suspect tonsillar abscess or retropharyngeal abscess.  Will treat patient with antibiotics.  Patient was very uncomfortable while in the ED so he was given Toradol  and dexamethasone .  Recommended treatment for pain at home with Tylenol and ibuprofen .  Reviewed return precautions.  Patient was given a note.  He voiced understanding, all questions were answered and he was stable at discharge.      FINAL CLINICAL IMPRESSION(S) / ED DIAGNOSES   Final diagnoses:  Strep throat     Rx / DC Orders   ED Discharge Orders          Ordered    amoxicillin  (AMOXIL ) 500 MG tablet  2 times daily        02/28/24 0854             Note:  This document was prepared using Dragon voice recognition software and may include unintentional dictation errors.   Cleaster Tinnie LABOR, PA-C 02/28/24 9144    Floy Roberts, MD 02/28/24 [phone]  "

## 2024-02-28 NOTE — Discharge Instructions (Addendum)
 You tested positive for strep which is a bacterial infection of the throat.  This is treated with antibiotics.  Please take all the medication even if you begin to feel better.  You can take 650 mg of Tylenol and 600 mg of ibuprofen  every 6 hours as needed for pain. You can use ice, heat, muscle creams and other topical pain relievers as well.

## 2024-03-11 ENCOUNTER — Encounter: Payer: Self-pay | Admitting: Hospitalist

## 2024-03-11 ENCOUNTER — Emergency Department

## 2024-03-11 ENCOUNTER — Observation Stay
Admission: EM | Admit: 2024-03-11 | Source: Home / Self Care | Attending: Internal Medicine | Admitting: Internal Medicine

## 2024-03-11 ENCOUNTER — Observation Stay

## 2024-03-11 ENCOUNTER — Other Ambulatory Visit: Payer: Self-pay

## 2024-03-11 DIAGNOSIS — I10 Essential (primary) hypertension: Secondary | ICD-10-CM | POA: Insufficient documentation

## 2024-03-11 DIAGNOSIS — I959 Hypotension, unspecified: Secondary | ICD-10-CM

## 2024-03-11 DIAGNOSIS — E1165 Type 2 diabetes mellitus with hyperglycemia: Secondary | ICD-10-CM

## 2024-03-11 DIAGNOSIS — G4733 Obstructive sleep apnea (adult) (pediatric): Secondary | ICD-10-CM | POA: Diagnosis present

## 2024-03-11 DIAGNOSIS — R748 Abnormal levels of other serum enzymes: Secondary | ICD-10-CM | POA: Diagnosis present

## 2024-03-11 DIAGNOSIS — N179 Acute kidney failure, unspecified: Secondary | ICD-10-CM

## 2024-03-11 DIAGNOSIS — R509 Fever, unspecified: Secondary | ICD-10-CM

## 2024-03-11 DIAGNOSIS — M6282 Rhabdomyolysis: Secondary | ICD-10-CM | POA: Diagnosis not present

## 2024-03-11 DIAGNOSIS — E119 Type 2 diabetes mellitus without complications: Secondary | ICD-10-CM

## 2024-03-11 LAB — CBC WITH DIFFERENTIAL/PLATELET
Abs Immature Granulocytes: 0.06 10*3/uL (ref 0.00–0.07)
Basophils Absolute: 0.1 10*3/uL (ref 0.0–0.1)
Basophils Relative: 1 %
Eosinophils Absolute: 0.2 10*3/uL (ref 0.0–0.5)
Eosinophils Relative: 1 %
HCT: 41.8 % (ref 39.0–52.0)
Hemoglobin: 14 g/dL (ref 13.0–17.0)
Immature Granulocytes: 1 %
Lymphocytes Relative: 59 %
Lymphs Abs: 6.5 10*3/uL — ABNORMAL HIGH (ref 0.7–4.0)
MCH: 28.3 pg (ref 26.0–34.0)
MCHC: 33.5 g/dL (ref 30.0–36.0)
MCV: 84.4 fL (ref 80.0–100.0)
Monocytes Absolute: 0.6 10*3/uL (ref 0.1–1.0)
Monocytes Relative: 5 %
Neutro Abs: 3.6 10*3/uL (ref 1.7–7.7)
Neutrophils Relative %: 33 %
Platelets: 317 10*3/uL (ref 150–400)
RBC: 4.95 MIL/uL (ref 4.22–5.81)
RDW: 13.6 % (ref 11.5–15.5)
Smear Review: NORMAL
WBC: 11.1 10*3/uL — ABNORMAL HIGH (ref 4.0–10.5)
nRBC: 0 % (ref 0.0–0.2)

## 2024-03-11 LAB — CK
Total CK: 2812 U/L — ABNORMAL HIGH (ref 49–397)
Total CK: 2252 U/L — ABNORMAL HIGH (ref 49–397)

## 2024-03-11 LAB — COMPREHENSIVE METABOLIC PANEL WITH GFR
ALT: 188 U/L — ABNORMAL HIGH (ref 0–44)
ALT: 153 U/L — ABNORMAL HIGH (ref 0–44)
AST: 185 U/L — ABNORMAL HIGH (ref 15–41)
AST: 148 U/L — ABNORMAL HIGH (ref 15–41)
Albumin: 3.2 g/dL — ABNORMAL LOW (ref 3.5–5.0)
Albumin: 2.7 g/dL — ABNORMAL LOW (ref 3.5–5.0)
Alkaline Phosphatase: 91 U/L (ref 38–126)
Alkaline Phosphatase: 75 U/L (ref 38–126)
Anion gap: 12 (ref 5–15)
Anion gap: 10 (ref 5–15)
BUN: 21 mg/dL — ABNORMAL HIGH (ref 6–20)
BUN: 20 mg/dL (ref 6–20)
CO2: 27 mmol/L (ref 22–32)
CO2: 26 mmol/L (ref 22–32)
Calcium: 8.2 mg/dL — ABNORMAL LOW (ref 8.9–10.3)
Calcium: 7.4 mg/dL — ABNORMAL LOW (ref 8.9–10.3)
Chloride: 92 mmol/L — ABNORMAL LOW (ref 98–111)
Chloride: 97 mmol/L — ABNORMAL LOW (ref 98–111)
Creatinine, Ser: 1.69 mg/dL — ABNORMAL HIGH (ref 0.61–1.24)
Creatinine, Ser: 1.55 mg/dL — ABNORMAL HIGH (ref 0.61–1.24)
GFR, Estimated: 54 mL/min — ABNORMAL LOW
GFR, Estimated: 60 mL/min — ABNORMAL LOW
Glucose, Bld: 124 mg/dL — ABNORMAL HIGH (ref 70–99)
Glucose, Bld: 101 mg/dL — ABNORMAL HIGH (ref 70–99)
Potassium: 3.8 mmol/L (ref 3.5–5.1)
Potassium: 3.6 mmol/L (ref 3.5–5.1)
Sodium: 130 mmol/L — ABNORMAL LOW (ref 135–145)
Sodium: 132 mmol/L — ABNORMAL LOW (ref 135–145)
Total Bilirubin: 0.7 mg/dL (ref 0.0–1.2)
Total Bilirubin: 0.6 mg/dL (ref 0.0–1.2)
Total Protein: 6.9 g/dL (ref 6.5–8.1)
Total Protein: 5.8 g/dL — ABNORMAL LOW (ref 6.5–8.1)

## 2024-03-11 LAB — URINALYSIS, ROUTINE W REFLEX MICROSCOPIC
Bacteria, UA: NONE SEEN
Bilirubin Urine: NEGATIVE
Glucose, UA: NEGATIVE mg/dL
Ketones, ur: NEGATIVE mg/dL
Leukocytes,Ua: NEGATIVE
Nitrite: NEGATIVE
Protein, ur: 100 mg/dL — AB
Specific Gravity, Urine: 1.012 (ref 1.005–1.030)
pH: 5 (ref 5.0–8.0)

## 2024-03-11 LAB — CBC
HCT: 36.8 % — ABNORMAL LOW (ref 39.0–52.0)
Hemoglobin: 12.4 g/dL — ABNORMAL LOW (ref 13.0–17.0)
MCH: 28.3 pg (ref 26.0–34.0)
MCHC: 33.7 g/dL (ref 30.0–36.0)
MCV: 84 fL (ref 80.0–100.0)
Platelets: 313 10*3/uL (ref 150–400)
RBC: 4.38 MIL/uL (ref 4.22–5.81)
RDW: 13.5 % (ref 11.5–15.5)
WBC: 8.6 10*3/uL (ref 4.0–10.5)
nRBC: 0 % (ref 0.0–0.2)

## 2024-03-11 LAB — RESP PANEL BY RT-PCR (RSV, FLU A&B, COVID)  RVPGX2
Influenza A by PCR: NEGATIVE
Influenza B by PCR: NEGATIVE
Resp Syncytial Virus by PCR: NEGATIVE
SARS Coronavirus 2 by RT PCR: NEGATIVE

## 2024-03-11 LAB — GLUCOSE, CAPILLARY: Glucose-Capillary: 118 mg/dL — ABNORMAL HIGH (ref 70–99)

## 2024-03-11 LAB — CBG MONITORING, ED: Glucose-Capillary: 157 mg/dL — ABNORMAL HIGH (ref 70–99)

## 2024-03-11 LAB — CREATININE, SERUM
Creatinine, Ser: 1.71 mg/dL — ABNORMAL HIGH (ref 0.61–1.24)
GFR, Estimated: 53 mL/min — ABNORMAL LOW

## 2024-03-11 MED ORDER — HEPARIN SODIUM (PORCINE) 5000 UNIT/ML IJ SOLN
5000.0000 [IU] | Freq: Three times a day (TID) | INTRAMUSCULAR | Status: AC
  Administered 2024-03-11 – 2024-03-16 (×16): 5000 [IU] via SUBCUTANEOUS
  Filled 2024-03-11 (×16): qty 1

## 2024-03-11 MED ORDER — ACETAMINOPHEN 500 MG PO TABS
1000.0000 mg | ORAL_TABLET | Freq: Once | ORAL | Status: AC
  Administered 2024-03-11: 1000 mg via ORAL
  Filled 2024-03-11: qty 2

## 2024-03-11 MED ORDER — HYDRALAZINE HCL 20 MG/ML IJ SOLN: 10.0000 mg | Freq: Four times a day (QID) | INTRAMUSCULAR | Status: AC | PRN

## 2024-03-11 MED ORDER — ONDANSETRON HCL 4 MG/2ML IJ SOLN
4.0000 mg | Freq: Four times a day (QID) | INTRAMUSCULAR | Status: AC | PRN
  Administered 2024-03-13 – 2024-03-15 (×5): 4 mg via INTRAVENOUS
  Filled 2024-03-11 (×5): qty 2

## 2024-03-11 MED ORDER — ONDANSETRON HCL 4 MG/2ML IJ SOLN
4.0000 mg | Freq: Once | INTRAMUSCULAR | Status: AC
  Administered 2024-03-11: 4 mg via INTRAVENOUS
  Filled 2024-03-11: qty 2

## 2024-03-11 MED ORDER — INSULIN ASPART 100 UNIT/ML IJ SOLN: 0.0000 [IU] | Freq: Every day | INTRAMUSCULAR | Status: AC

## 2024-03-11 MED ORDER — ACETAMINOPHEN 325 MG PO TABS
650.0000 mg | ORAL_TABLET | Freq: Four times a day (QID) | ORAL | Status: AC | PRN
  Administered 2024-03-11 – 2024-03-16 (×9): 650 mg via ORAL
  Filled 2024-03-11 (×9): qty 2

## 2024-03-11 MED ORDER — INSULIN ASPART 100 UNIT/ML IJ SOLN
0.0000 [IU] | Freq: Three times a day (TID) | INTRAMUSCULAR | Status: AC
  Administered 2024-03-12: 3 [IU] via SUBCUTANEOUS
  Administered 2024-03-12 – 2024-03-13 (×2): 2 [IU] via SUBCUTANEOUS
  Administered 2024-03-13 – 2024-03-14 (×2): 1 [IU] via SUBCUTANEOUS
  Administered 2024-03-14 (×2): 2 [IU] via SUBCUTANEOUS
  Administered 2024-03-15: 1 [IU] via SUBCUTANEOUS
  Administered 2024-03-15: 2 [IU] via SUBCUTANEOUS
  Administered 2024-03-16: 1 [IU] via SUBCUTANEOUS
  Administered 2024-03-16: 3 [IU] via SUBCUTANEOUS
  Administered 2024-03-16: 2 [IU] via SUBCUTANEOUS
  Filled 2024-03-11: qty 3
  Filled 2024-03-11: qty 2
  Filled 2024-03-11: qty 1
  Filled 2024-03-11 (×4): qty 2
  Filled 2024-03-11 (×3): qty 1
  Filled 2024-03-11 (×2): qty 2

## 2024-03-11 MED ORDER — PANTOPRAZOLE SODIUM 40 MG PO TBEC
40.0000 mg | DELAYED_RELEASE_TABLET | Freq: Two times a day (BID) | ORAL | Status: AC
  Administered 2024-03-11 – 2024-03-16 (×11): 40 mg via ORAL
  Filled 2024-03-11 (×11): qty 1

## 2024-03-11 MED ORDER — SODIUM CHLORIDE 0.9 % IV BOLUS
1000.0000 mL | Freq: Once | INTRAVENOUS | Status: AC
  Administered 2024-03-11: 1000 mL via INTRAVENOUS

## 2024-03-11 MED ORDER — ONDANSETRON HCL 4 MG PO TABS: 4.0000 mg | ORAL_TABLET | Freq: Four times a day (QID) | ORAL | Status: AC | PRN

## 2024-03-11 MED ORDER — ACETAMINOPHEN 650 MG RE SUPP: 650.0000 mg | Freq: Four times a day (QID) | RECTAL | Status: AC | PRN

## 2024-03-11 MED ORDER — ENSURE PLUS HIGH PROTEIN PO LIQD
237.0000 mL | Freq: Two times a day (BID) | ORAL | Status: DC
  Administered 2024-03-12 – 2024-03-16 (×8): 237 mL via ORAL

## 2024-03-11 MED ORDER — SODIUM CHLORIDE 0.9 % IV SOLN: INTRAVENOUS | Status: DC

## 2024-03-11 MED ORDER — ORAL CARE MOUTH RINSE: 15.0000 mL | OROMUCOSAL | Status: AC | PRN

## 2024-03-11 MED ORDER — POLYETHYLENE GLYCOL 3350 17 G PO PACK
17.0000 g | PACK | Freq: Every day | ORAL | Status: DC | PRN
  Administered 2024-03-12: 17 g via ORAL
  Filled 2024-03-11: qty 1

## 2024-03-11 MED ORDER — OXYCODONE HCL 5 MG PO TABS
5.0000 mg | ORAL_TABLET | ORAL | Status: AC | PRN
  Administered 2024-03-12 – 2024-03-15 (×4): 5 mg via ORAL
  Filled 2024-03-11 (×4): qty 1

## 2024-03-11 MED ORDER — GUAIFENESIN 100 MG/5ML PO LIQD
10.0000 mL | ORAL | Status: AC | PRN
  Administered 2024-03-11 – 2024-03-16 (×7): 10 mL via ORAL
  Filled 2024-03-11 (×7): qty 10

## 2024-03-11 MED ORDER — MORPHINE SULFATE (PF) 2 MG/ML IV SOLN
2.0000 mg | INTRAVENOUS | Status: AC | PRN
  Administered 2024-03-12 (×2): 2 mg via INTRAVENOUS
  Filled 2024-03-11 (×2): qty 1

## 2024-03-11 NOTE — ED Provider Notes (Signed)
 "  [facility] Provider Note    Event Date/Time   First MD Initiated Contact with Patient 03/11/24 [phone]     (approximate)   History   Generalized Body Aches   HPI  Rickey Payne is a 34 y.o. male who was treated for strep throat on 1/20 who now comes in with generalized bodyaches, headache intermittently, sore throat, cough, chills at night and diffuse muscle aches. Patient reports that he feels like his sore throat has improved but he still continues to have the other symptoms.  He reports this been ongoing for the past 10 days.  He not taking fever reducers prior to coming in.  He does report a cough that has been the worst part of his symptoms.  He denies any current abdominal pain, shortness of breath no new swelling in his legs.  Patient does take medications for hypertension and is a diabetic.  Physical Exam   Triage Vital Signs: ED Triage Vitals  Encounter Vitals Group     BP 03/11/24 0716 131/83     Girls Systolic BP Percentile --      Girls Diastolic BP Percentile --      Boys Systolic BP Percentile --      Boys Diastolic BP Percentile --      Pulse Rate 03/11/24 0716 97     Resp 03/11/24 0716 16     Temp 03/11/24 0716 98.6 F (37 C)     Temp Source 03/11/24 0716 Oral     SpO2 03/11/24 0716 95 %     Weight 03/11/24 0717 (!) 409 lb 13.4 oz (185.9 kg)     Height --      Head Circumference --      Peak Flow --      Pain Score 03/11/24 0716 10     Pain Loc --      Pain Education --      Exclude from Growth Chart --     Most recent vital signs: Vitals:   03/11/24 0716  BP: 131/83  Pulse: 97  Resp: 16  Temp: 98.6 F (37 C)  SpO2: 95%     General: Awake, no distress.  CV:  Good peripheral perfusion.  Resp:  Normal effort.  No wheezing Abd:  No distention.  Soft and nontender Other:  No calf tenderness.  No swelling noted Patient is well-appearing.  Able to range his neck.  Oropharynx is normal.  Uvula midline.  ED Results /  Procedures / Treatments   Labs (all labs ordered are listed, but only abnormal results are displayed) Labs Reviewed  RESP PANEL BY RT-PCR (RSV, FLU A&B, COVID)  RVPGX2  CBC WITH DIFFERENTIAL/PLATELET  COMPREHENSIVE METABOLIC PANEL WITH GFR  CK  URINALYSIS, ROUTINE W REFLEX MICROSCOPIC      RADIOLOGY I have reviewed the xray personally and interpreted no evidence of any pneumonia   PROCEDURES:  Critical Care performed: No  Procedures   MEDICATIONS ORDERED IN ED: Medications - No data to display   IMPRESSION / MDM / ASSESSMENT AND PLAN / ED COURSE  I reviewed the triage vital signs and the nursing notes.   Patient's presentation is most consistent with acute presentation with potential threat to life or bodily function.    Differential includes rhabdo, electro abnormality, pneumonia.  Patient is otherwise well-appearing with normal vital signs.  Low suspicion for retropharyngeal abscess, no evidence of peritonsillar abscess based on examination.  CK is elevated at 2000.  CBC shows  slightly elevated white count with lymphocytes suspect more likely viral illness.  His CMP does not show any evidence of DKA but he does have slight elevation of LFTs and slight elevation of kidney function.  His COVID and flu are negative.  Patient's symptoms are consistent with postviral mild rhabdomyolysis.  We discussed admission versus discharge.  Patient is going to have blood work rechecked after 2 L of fluids and see if it is improving he would prefer to be discharged home with increased oral hydration and avoiding any heavy exercising and following up with his primary care doctor in 2 days.  12:55 PM patient still having bodyaches although CK level is decreased only slightly will discuss with hospital team for admission given he still feeling unwell with elevated CK, AKI, LFT elevation.    FINAL CLINICAL IMPRESSION(S) / ED DIAGNOSES   Final diagnoses:  Non-traumatic rhabdomyolysis  AKI  (acute kidney injury)     Rx / DC Orders   ED Discharge Orders     None        Note:  This document was prepared using Dragon voice recognition software and may include unintentional dictation errors.   Ernest Ronal BRAVO, MD 03/11/24 1317  "

## 2024-03-11 NOTE — ED Triage Notes (Signed)
 Seen and treated for strep throat on 1/20. Completed course of ABX but states has never really felt better.  No c/o sore throat but c/o generalized body aches, neck pain, headache. C/o chills at night but no known fever.

## 2024-03-12 DIAGNOSIS — R509 Fever, unspecified: Secondary | ICD-10-CM

## 2024-03-12 DIAGNOSIS — E1165 Type 2 diabetes mellitus with hyperglycemia: Secondary | ICD-10-CM

## 2024-03-12 DIAGNOSIS — I959 Hypotension, unspecified: Secondary | ICD-10-CM

## 2024-03-12 LAB — HIV ANTIBODY (ROUTINE TESTING W REFLEX): HIV Screen 4th Generation wRfx: NONREACTIVE

## 2024-03-12 LAB — GLUCOSE, CAPILLARY
Glucose-Capillary: 121 mg/dL — ABNORMAL HIGH (ref 70–99)
Glucose-Capillary: 218 mg/dL — ABNORMAL HIGH (ref 70–99)
Glucose-Capillary: 167 mg/dL — ABNORMAL HIGH (ref 70–99)
Glucose-Capillary: 124 mg/dL — ABNORMAL HIGH (ref 70–99)

## 2024-03-12 LAB — HEPATITIS PANEL, ACUTE
HCV Ab: NONREACTIVE
Hep A IgM: NONREACTIVE
Hep B C IgM: NONREACTIVE
Hepatitis B Surface Ag: NONREACTIVE

## 2024-03-12 LAB — CBC
HCT: 37.2 % — ABNORMAL LOW (ref 39.0–52.0)
Hemoglobin: 12.2 g/dL — ABNORMAL LOW (ref 13.0–17.0)
MCH: 27.9 pg (ref 26.0–34.0)
MCHC: 32.8 g/dL (ref 30.0–36.0)
MCV: 85.1 fL (ref 80.0–100.0)
Platelets: 323 10*3/uL (ref 150–400)
RBC: 4.37 MIL/uL (ref 4.22–5.81)
RDW: 13.6 % (ref 11.5–15.5)
WBC: 9.9 10*3/uL (ref 4.0–10.5)
nRBC: 0 % (ref 0.0–0.2)

## 2024-03-12 LAB — HEMOGLOBIN A1C
Hgb A1c MFr Bld: 7.2 % — ABNORMAL HIGH (ref 4.8–5.6)
Mean Plasma Glucose: 159.94 mg/dL

## 2024-03-12 LAB — COMPREHENSIVE METABOLIC PANEL WITH GFR
ALT: 152 U/L — ABNORMAL HIGH (ref 0–44)
AST: 134 U/L — ABNORMAL HIGH (ref 15–41)
Albumin: 2.7 g/dL — ABNORMAL LOW (ref 3.5–5.0)
Alkaline Phosphatase: 77 U/L (ref 38–126)
Anion gap: 9 (ref 5–15)
BUN: 22 mg/dL — ABNORMAL HIGH (ref 6–20)
CO2: 26 mmol/L (ref 22–32)
Calcium: 7.6 mg/dL — ABNORMAL LOW (ref 8.9–10.3)
Chloride: 96 mmol/L — ABNORMAL LOW (ref 98–111)
Creatinine, Ser: 1.51 mg/dL — ABNORMAL HIGH (ref 0.61–1.24)
GFR, Estimated: >60 mL/min
Glucose, Bld: 129 mg/dL — ABNORMAL HIGH (ref 70–99)
Potassium: 3.9 mmol/L (ref 3.5–5.1)
Sodium: 131 mmol/L — ABNORMAL LOW (ref 135–145)
Total Bilirubin: 0.6 mg/dL (ref 0.0–1.2)
Total Protein: 5.8 g/dL — ABNORMAL LOW (ref 6.5–8.1)

## 2024-03-12 LAB — EXPECTORATED SPUTUM ASSESSMENT W GRAM STAIN, RFLX TO RESP C

## 2024-03-12 LAB — CK: Total CK: 1759 U/L — ABNORMAL HIGH (ref 49–397)

## 2024-03-12 MED ORDER — SODIUM CHLORIDE 0.9 % IV SOLN: INTRAVENOUS | Status: DC

## 2024-03-12 MED ORDER — SODIUM CHLORIDE 0.9 % IV SOLN
2.0000 g | INTRAVENOUS | Status: AC
  Administered 2024-03-12 – 2024-03-16 (×5): 2 g via INTRAVENOUS
  Filled 2024-03-12 (×6): qty 20

## 2024-03-12 MED ORDER — LORATADINE 10 MG PO TABS: 10.0000 mg | ORAL_TABLET | Freq: Every day | ORAL | Status: AC | PRN

## 2024-03-12 MED ORDER — DOXYCYCLINE HYCLATE 100 MG PO TABS
100.0000 mg | ORAL_TABLET | Freq: Two times a day (BID) | ORAL | Status: AC
  Administered 2024-03-12 – 2024-03-16 (×9): 100 mg via ORAL
  Filled 2024-03-12 (×9): qty 1

## 2024-03-12 NOTE — Discharge Instructions (Signed)
 Food Resources  Agency Name: [facility] Address: [address], [place], [place] [address] Phone: [phone] Website: [email or web address] Service(s) Offered: Housing services, self-sufficiency, congregate meal program, weatherization program, Event organiser program, emergency food assistance,  housing counseling, home ownership program, wheels - to work program.  [employer] free for 60 and older at various locations from [employer], Monday-Friday:  [employer], [address] [place], [phone] -[facility], [address], [place] [phone]  -[facility], [address] [place] [phone]  -[address], [address], [place], [phone]  Agency Name: [employer] Address: [phone] [address], [address], [place], [place] [address] Phone: [phone] Website: [email or web address] Service(s) Offered: Home delivered hot, frozen, and emergency  meals. Grocery assistance program which matches  volunteers one-on-one with seniors unable to grocery shop  for themselves. Must be 60 years and older; less than 20  hours of in-home aide service, limited or no driving ability;  live alone or with someone with a disability; live in  [place].  Agency Name: [employer] [facility]) Address: [address], [place], [place] [address] Phone: [phone] Service(s) Offered: Food is served to shut-ins, homeless, elderly, and low income people in the community every Saturday (11:30 am-12:30 pm) and Sunday (12:30 pm-1:30pm). Volunteers also offer help and encouragement in seeking employment,  and spiritual guidance.  Agency Name: [employer] Address: [address] [place], [place] [address] Phone: [phone] Service(s) Offered: Child support services; child welfare services; food stamps; Medicaid; work first family assistance; and aid  with fuel,  rent, food and medicine.  Agency Name: [employer] Address: [address], [place], [place] Phone: [phone] Website: [email or web address] Services Offered: Monday 10:00am-12:00, 8:00pm-9:00pm, and Friday 10:00am-12:00.  Agency Name: [employer] [facility] Address: [address] [place], [place] [address] Phone: [phone] Website: [email or web address] Service(s) Offered: Serves weekday meals, open from 11:30 am- 1:00 pm., and 6:30-7:30pm, Monday-Wednesday-Friday distributes food 3:30-6pm, Monday-Wednesday-Friday.  Agency Name: [facility] Address: [address], [place], [place] Phone: [phone] Website: [email or web address] Services Offered: Distributes food the 4th Saturday of the month, starting at 8:00 am  Agency Name: [facility] Address: [phone] [place], [place] [address] Phone: [phone] Website: [email or web address] Service(s) Offered: Bread of life, weekly food pantry. Open Wednesdays from 10:00am-noon.  Agency Name: [facility] [employer] Address: [address] [place], [place], [place] Phone: [phone] Services Offered: Distributes food 9am-1pm, Monday-Thursday. Call for details.  Agency Name: [facility] Address: [address] [place], [place] [address] Phone: [phone] Website: [email or web address] Service(s) Offered: Games developer. Call for assistance.  Agency Name: [facility] Address: [address], [place], [place] [address] Phone: [phone] Service Offered: Emergency Food Pantry. Call for appointment.  Agency Name: [facility] Address: [address], [place], [place] [address] Phone: [phone] Website: [email or web address] Services Offered: Games developer. Call for details  Agency Name: [facility] Address: [address] [place], [place] Phone:  [phone] Website: [email or web address] Service(s) Offered: Emergency Food Pantry. Call for details.  Agency Name: [employer] representative Address: [address], [place], [place] [address] Phone: [phone] or [phone] Website: [email or web address] Service(s) Offered: Distribute food 9am-11:30 am, Tuesday-Friday, and 1-3:30pm, Monday-Friday. Food pantry Monday-Friday 1pm-3pm, fresh items, Mon.-Wed.-Fri.  Agency Name: [facility] Address: [address] [place], [place] [address] Phone: [phone] Website: [email or web address] Services Offered: Distribute food Tues and Sats from 9:00am-noon.  Closed 1st Saturday of each month. Call for details  Agency Name: [employer] Address: Fayrene Boatman [facility] [address], [place] [address] Phone: [phone]  Services Offered: Delivers meals every Thursday

## 2024-03-12 NOTE — Assessment & Plan Note (Addendum)
 Recently had a course of antibiotic with strep throat.  Will send off Epstein-Barr profile.  Repeat chest x-ray this a.m. still pending read.  Blood cultures drawn yesterday showed no growth so far.  Sputum culture pending.  Viral respiratory panel pending.

## 2024-03-12 NOTE — Assessment & Plan Note (Signed)
 Creatinine 1.69 on presentation baseline is 0.7.  Today's creatinine 1.51.  Hold medications that can worsen kidney function including hydrochlorothiazide , losartan , metformin .

## 2024-03-12 NOTE — TOC CM/SW Note (Signed)
 Transition of Care [facility]) - Inpatient Brief Assessment   Patient Details  Name: LANI MENDIOLA MRN: [id number] Date of Birth: [birth year]  Transition of Care [facility]) CM/SW Contact:    Daved JONETTA Hamilton, RN Phone Number: 03/12/2024, 2:29 PM   Clinical Narrative:   Transition of Care (TOC) Screening Note   Patient Details  Name: OLGA SEYLER Date of Birth: 1990-11-17   Transition of Care [facility]) CM/SW Contact:    Daved JONETTA Hamilton, RN Phone Number: 03/12/2024, 2:29 PM    Transition of Care Department [facility]) has reviewed patient and no TOC needs have been identified at this time. If new patient transition needs arise, please place a TOC consult.    Transition of Care Asessment: Insurance and Status: Insurance coverage has been reviewed Patient has primary care physician: Yes   Prior level of function:: Independent Prior/Current Home Services: No current home services Social Drivers of Health Review: SDOH reviewed no interventions necessary Readmission risk has been reviewed: Yes Transition of care needs: no transition of care needs at this time

## 2024-03-12 NOTE — Assessment & Plan Note (Signed)
 -  CPAP at night

## 2024-03-12 NOTE — Plan of Care (Signed)

## 2024-03-12 NOTE — Assessment & Plan Note (Signed)
 Hold antihypertensive medications and continue IV fluid.

## 2024-03-12 NOTE — Assessment & Plan Note (Signed)
 Acute hepatitis profile negative.  Liver ultrasound showing hepatic steatosis.

## 2024-03-13 ENCOUNTER — Inpatient Hospital Stay

## 2024-03-13 ENCOUNTER — Encounter

## 2024-03-13 LAB — BASIC METABOLIC PANEL WITH GFR
Anion gap: 8 (ref 5–15)
BUN: 21 mg/dL — ABNORMAL HIGH (ref 6–20)
CO2: 26 mmol/L (ref 22–32)
Calcium: 7.5 mg/dL — ABNORMAL LOW (ref 8.9–10.3)
Chloride: 98 mmol/L (ref 98–111)
Creatinine, Ser: 1.48 mg/dL — ABNORMAL HIGH (ref 0.61–1.24)
GFR, Estimated: >60 mL/min
Glucose, Bld: 124 mg/dL — ABNORMAL HIGH (ref 70–99)
Potassium: 3.8 mmol/L (ref 3.5–5.1)
Sodium: 132 mmol/L — ABNORMAL LOW (ref 135–145)

## 2024-03-13 LAB — GLUCOSE, CAPILLARY
Glucose-Capillary: 105 mg/dL — ABNORMAL HIGH (ref 70–99)
Glucose-Capillary: 144 mg/dL — ABNORMAL HIGH (ref 70–99)
Glucose-Capillary: 185 mg/dL — ABNORMAL HIGH (ref 70–99)
Glucose-Capillary: 168 mg/dL — ABNORMAL HIGH (ref 70–99)

## 2024-03-13 LAB — RESPIRATORY PANEL BY PCR

## 2024-03-13 LAB — CK: Total CK: 1297 U/L — ABNORMAL HIGH (ref 49–397)

## 2024-03-13 MED ORDER — SODIUM CHLORIDE 0.9 % IV SOLN: INTRAVENOUS | Status: DC

## 2024-03-13 MED ORDER — POLYETHYLENE GLYCOL 3350 17 G PO PACK
17.0000 g | PACK | Freq: Two times a day (BID) | ORAL | Status: AC
  Administered 2024-03-13 – 2024-03-16 (×7): 17 g via ORAL
  Filled 2024-03-13 (×7): qty 1

## 2024-03-13 NOTE — Plan of Care (Signed)

## 2024-03-13 NOTE — Plan of Care (Signed)
  Problem: Metabolic: Goal: Ability to maintain appropriate glucose levels will improve Outcome: Progressing   Problem: Nutritional: Goal: Maintenance of adequate nutrition will improve Outcome: Progressing   Problem: Skin Integrity: Goal: Risk for impaired skin integrity will decrease Outcome: Progressing   Problem: Clinical Measurements: Goal: Ability to maintain clinical measurements within normal limits will improve Outcome: Progressing

## 2024-03-14 LAB — BASIC METABOLIC PANEL WITH GFR
Anion gap: 11 (ref 5–15)
BUN: 27 mg/dL — ABNORMAL HIGH (ref 6–20)
CO2: 25 mmol/L (ref 22–32)
Calcium: 7.7 mg/dL — ABNORMAL LOW (ref 8.9–10.3)
Chloride: 96 mmol/L — ABNORMAL LOW (ref 98–111)
Creatinine, Ser: 2.71 mg/dL — ABNORMAL HIGH (ref 0.61–1.24)
GFR, Estimated: 31 mL/min — ABNORMAL LOW
Glucose, Bld: 148 mg/dL — ABNORMAL HIGH (ref 70–99)
Potassium: 4.5 mmol/L (ref 3.5–5.1)
Sodium: 132 mmol/L — ABNORMAL LOW (ref 135–145)

## 2024-03-14 LAB — CK: Total CK: 1211 U/L — ABNORMAL HIGH (ref 49–397)

## 2024-03-14 LAB — CBC
HCT: 41.1 % (ref 39.0–52.0)
Hemoglobin: 13.4 g/dL (ref 13.0–17.0)
MCH: 28 pg (ref 26.0–34.0)
MCHC: 32.6 g/dL (ref 30.0–36.0)
MCV: 86 fL (ref 80.0–100.0)
Platelets: 380 10*3/uL (ref 150–400)
RBC: 4.78 MIL/uL (ref 4.22–5.81)
RDW: 13.8 % (ref 11.5–15.5)
WBC: 14 10*3/uL — ABNORMAL HIGH (ref 4.0–10.5)
nRBC: 0 % (ref 0.0–0.2)

## 2024-03-14 LAB — EPSTEIN-BARR VIRUS (EBV) ANTIBODY PROFILE
EBV NA IgG: 78.7 U/mL — ABNORMAL HIGH (ref 0.0–17.9)
EBV VCA IgG: 119 U/mL — ABNORMAL HIGH (ref 0.0–17.9)
EBV VCA IgM: <36 U/mL (ref 0.0–35.9)

## 2024-03-14 LAB — GLUCOSE, CAPILLARY
Glucose-Capillary: 149 mg/dL — ABNORMAL HIGH (ref 70–99)
Glucose-Capillary: 168 mg/dL — ABNORMAL HIGH (ref 70–99)
Glucose-Capillary: 180 mg/dL — ABNORMAL HIGH (ref 70–99)
Glucose-Capillary: 139 mg/dL — ABNORMAL HIGH (ref 70–99)

## 2024-03-14 MED ORDER — LACTULOSE 10 GM/15ML PO SOLN
30.0000 g | Freq: Once | ORAL | Status: AC
  Administered 2024-03-14: 30 g via ORAL
  Filled 2024-03-14: qty 60

## 2024-03-14 NOTE — TOC Progression Note (Signed)
 Transition of Care [facility]) - Progression Note    Patient Details  Name: Rickey Payne MRN: [id number] Date of Birth: 14-Aug-1990  Transition of Care [facility]) CM/SW Contact  Dalia GORMAN Fuse, RN Phone Number: 03/14/2024, 9:23 AM  Clinical Narrative:     Creatnine 2.71 trending up and CK trending down, continues on IVF, epstein bar panel negative, c-xray wnl.   No TOC needs at this time.                     Expected Discharge Plan and Services                                               Social Drivers of Health (SDOH) Interventions SDOH Screenings   Food Insecurity: Food Insecurity Present (03/11/2024)  Housing: Low Risk (03/11/2024)  Transportation Needs: No Transportation Needs (03/11/2024)  Utilities: Patient Declined (03/11/2024)  Depression (PHQ2-9): Low Risk (11/08/2023)  Social Connections: Unknown (03/11/2024)  Tobacco Use: Low Risk (03/11/2024)    Readmission Risk Interventions     No data to display

## 2024-03-14 NOTE — Progress Notes (Signed)
 " PROGRESS NOTE    Rickey Payne   FMW:[id number] DOB: 1990-12-15  DOA: 03/11/2024 Date of Service: 03/14/24 which is hospital day 2  PCP: [name], MD    ED Course:   34 y.o. male with medical history significant for morbid obesity, asthma, diabetes, HTN, HLD who came into ED complaining of aches and pains for the last 10 days.    HPI: Patient had streptococcal throat infection and was treated with antibiotic on 02/28/2024.  Patient completed the antibiotic course but is still having generalized aches and pains and not feeling well.  Patient reports that he feels like his sore throat has improved but he still has some cough which has been getting worse.  He denies any nausea, vomiting, abdominal pain, shortness of breath, leg swelling.  He denies any change of medications.  He uses atorvastatin   for the last 3 years.  Denies any excessive workout or trauma.  Hospital course / significant events: 01/02 Upon arrival to the ED, patient is found to be elevated CPK at 2812 and AKI at 1.69.  Patient was given IV fluid 2 L.  Patient CPK went down to 2252 and AKI improved with creatinine of 1.55.  COVID flu and RSV negative.  Due to patient not feeling well and numbers are not going down significantly, ED provider called the hospitalist team for evaluation for admission for IV fluid hydration 02/02.  Acute hepatitis profile negative. Spiked a fever in the afternoon and blood cultures obtained and started on empiric Rocephin  and doxycycline . CXR no concerns.  2/3.  CK down to 1297 and creatinine down to 1.48.  Continue IV fluid. Resp panel 20 PCR neg.  02/04: CK to 1211, Cr up to 2.71     Consultants:  none  Procedures/Surgeries: none      ASSESSMENT & PLAN:   Rhabdomyolysis IV fluid hydration.   CK on admission 2812 down to 1211 today (about same as yesterday) Continue IV fluids today.  Hold Lipitor.   AKI (acute kidney injury) Creatinine 1.69 on presentation 02/01 baseline  is 0.7.   02/03 yesterday Cr 1.48 --> 02/04 today up to 2.71 Hold nephrotoxic meds incl hydrochlorothiazide , losartan , metformin .   Hypotension - resolved  Held antihypertensive medications and continue IV fluid.   Hyponatremia  Likely d/t AKI / dehydration TSH WNL recently On hydrochlorothiazide  at home  monitor BMP  If persisting tomorrow will broaden workup  Low grade fever Recently had a course of antibiotic with strep throat. No obvious infectious cause  Repeat chest x-ray yesterday no concerns Blood cultures initial and repeat, no concerns  EBV c/w past infection but IgM neg for new infection Sputum culture pending Suspect he does have some viral illness potential false negative testing      Uncontrolled type 2 diabetes mellitus with hyperglycemia, without long-term current use of insulin  Holding metformin  with impaired kidney function.   Holding Mounjaro  while in the hospital.   On sliding scale insulin .   Elevated liver enzymes Hepatic steatosis  Acute hepatitis profile negative.  Liver ultrasound showing hepatic steatosis. Monitor labs   OSA (obstructive sleep apnea) CPAP at night          Super morbid obesity based on BMI: Body mass index is 54.09 kg/m.SABRA Significantly low or high BMI is associated with higher medical risk.  Underweight - under 18  overweight - 25 to 29 obese - 30 or more Class 1 obesity: BMI of 30.0 to 34 Class 2 obesity: BMI of 35.0  to 39 Class 3 obesity: BMI of 40.0 to 49 Super Morbid Obesity: BMI 50-59 Super-super Morbid Obesity: BMI 60+ Healthy nutrition and physical activity advised as adjunct to other disease management and risk reduction treatments    DVT prophylaxis: heparin  IV fluids: 100 cc/h continuous IV fluids  Nutrition: carb modified Central lines / other devices: none  Code Status: FULL CODE ACP documentation reviewed:  none on file in [facility]  TOC needs: none Medical barriers to dispo at this time: renal  fxn. Expected readiness for discharge per TOC at this time:                Subjective / Brief ROS:  Patient reports fatigue, sore throat better, still feverish / chills Denies CP/SOB.  Pain controlled.  Denies new weakness.  Tolerating diet.  Reports no concerns w/ urination/defecation.   Family Communication: call to pt's wife she goes by Nat, spoke w/ her 03/14/24 5:47 PM     Objective Findings:  Vitals:   03/13/24 1955 03/14/24 0431 03/14/24 0811 03/14/24 1619  BP: 130/70 123/82 125/78 (!) 164/104  Pulse: 97 (!) 103 (!) 101 93  Resp: 17 18 18 20   Temp: 98.8 F (37.1 C) 98.6 F (37 C) 99.1 F (37.3 C) 97.8 F (36.6 C)  TempSrc:    Oral  SpO2: 97% 97% 98% 99%  Weight:      Height:        Intake/Output Summary (Last 24 hours) at 03/14/2024 1746 Last data filed at 03/14/2024 1300 Gross per 24 hour  Intake 1286.79 ml  Output --  Net 1286.79 ml   Filed Weights   03/11/24 0717 03/11/24 1815  Weight: (!) 185.9 kg (!) 191.1 kg    Examination:  Physical Exam       Scheduled Medications:   doxycycline   100 mg Oral Q12H   feeding supplement  237 mL Oral BID BM   heparin   5,000 Units Subcutaneous Q8H   insulin  aspart  0-5 Units Subcutaneous QHS   insulin  aspart  0-9 Units Subcutaneous TID WC   pantoprazole   40 mg Oral BID AC   polyethylene glycol  17 g Oral BID    Continuous Infusions:  sodium chloride  100 mL/hr at 03/14/24 1025   cefTRIAXone  (ROCEPHIN )  IV 2 g (03/14/24 1739)    PRN Medications:  acetaminophen  **OR** acetaminophen , guaiFENesin , hydrALAZINE , loratadine , morphine  injection, ondansetron  **OR** ondansetron  (ZOFRAN ) IV, mouth rinse, oxyCODONE   Antimicrobials from admission:  Anti-infectives (From admission, onward)    Start     Dose/Rate Route Frequency Ordered Stop   03/12/24 2200  doxycycline  (VIBRA -TABS) tablet 100 mg        100 mg Oral Every 12 hours 03/12/24 1637     03/12/24 1800  cefTRIAXone  (ROCEPHIN ) 2 g in sodium  chloride 0.9 % 100 mL IVPB        2 g 200 mL/hr over 30 Minutes Intravenous Every 24 hours 03/12/24 1636             Data Reviewed:  I have personally reviewed the following...  CBC: Recent Labs  Lab 03/11/24 0850 03/11/24 1833 03/12/24 0509 03/14/24 0505  WBC 11.1* 8.6 9.9 14.0*  NEUTROABS 3.6  --   --   --   HGB 14.0 12.4* 12.2* 13.4  HCT 41.8 36.8* 37.2* 41.1  MCV 84.4 84.0 85.1 86.0  PLT 317 313 323 380   Basic Metabolic Panel: Recent Labs  Lab 03/11/24 0850 03/11/24 1131 03/11/24 1833 03/12/24 0509 03/13/24 0529 03/14/24  0505  NA 130* 132*  --  131* 132* 132*  K 3.8 3.6  --  3.9 3.8 4.5  CL 92* 97*  --  96* 98 96*  CO2 27 26  --  26 26 25   GLUCOSE 124* 101*  --  129* 124* 148*  BUN 21* 20  --  22* 21* 27*  CREATININE 1.69* 1.55* 1.71* 1.51* 1.48* 2.71*  CALCIUM  8.2* 7.4*  --  7.6* 7.5* 7.7*   GFR: Estimated Creatinine Clearance: 68.3 mL/min (A) (by C-G formula based on SCr of 2.71 mg/dL (H)). Liver Function Tests: Recent Labs  Lab 03/11/24 0850 03/11/24 1131 03/12/24 0509  AST 185* 148* 134*  ALT 188* 153* 152*  ALKPHOS 91 75 77  BILITOT 0.7 0.6 0.6  PROT 6.9 5.8* 5.8*  ALBUMIN 3.2* 2.7* 2.7*   No results for input(s): LIPASE, AMYLASE in the last 168 hours. No results for input(s): AMMONIA in the last 168 hours. Coagulation Profile: No results for input(s): INR, PROTIME in the last 168 hours. Cardiac Enzymes: Recent Labs  Lab 03/11/24 0850 03/11/24 1131 03/12/24 0509 03/13/24 0529 03/14/24 0505  CKTOTAL 2,812* 2,252* 1,759* 1,297* 1,211*   BNP (last 3 results) No results for input(s): PROBNP in the last 8760 hours. HbA1C: No results for input(s): HGBA1C in the last 72 hours.  CBG: Recent Labs  Lab 03/13/24 1703 03/13/24 1955 03/14/24 0812 03/14/24 1204 03/14/24 1614  GLUCAP 185* 168* 149* 168* 180*   Lipid Profile: No results for input(s): CHOL, HDL, LDLCALC, TRIG, CHOLHDL, LDLDIRECT in the last  72 hours. Thyroid Function Tests: No results for input(s): TSH, T4TOTAL, FREET4, T3FREE, THYROIDAB in the last 72 hours. Anemia Panel: No results for input(s): VITAMINB12, FOLATE, FERRITIN, TIBC, IRON, RETICCTPCT in the last 72 hours. Most Recent Urinalysis On File:     Component Value Date/Time   COLORURINE YELLOW (A) 03/11/2024 0850   APPEARANCEUR HAZY (A) 03/11/2024 0850   APPEARANCEUR Clear 06/20/2019 1056   LABSPEC 1.012 03/11/2024 0850   PHURINE 5.0 03/11/2024 0850   GLUCOSEU NEGATIVE 03/11/2024 0850   GLUCOSEU NEGATIVE 05/20/2017 1126   HGBUR MODERATE (A) 03/11/2024 0850   BILIRUBINUR NEGATIVE 03/11/2024 0850   BILIRUBINUR Negative 06/20/2019 1056   KETONESUR NEGATIVE 03/11/2024 0850   PROTEINUR 100 (A) 03/11/2024 0850   UROBILINOGEN 0.2 05/20/2017 1126   NITRITE NEGATIVE 03/11/2024 0850   LEUKOCYTESUR NEGATIVE 03/11/2024 0850   Sepsis Labs: @LABRCNTIP (procalcitonin:4,lacticidven:4) Microbiology: Recent Results (from the past 240 hours)  Resp panel by RT-PCR (RSV, Flu A&B, Covid) Anterior Nasal Swab     Status: None   Collection Time: 03/11/24  7:37 AM   Specimen: Anterior Nasal Swab  Result Value Ref Range Status   SARS Coronavirus 2 by RT PCR NEGATIVE NEGATIVE Final    Comment: (NOTE) SARS-CoV-2 target nucleic acids are NOT DETECTED.  The SARS-CoV-2 RNA is generally detectable in upper respiratory specimens during the acute phase of infection. The lowest concentration of SARS-CoV-2 viral copies this assay can detect is 138 copies/mL. A negative result does not preclude SARS-Cov-2 infection and should not be used as the sole basis for treatment or other patient management decisions. A negative result may occur with  improper specimen collection/handling, submission of specimen other than nasopharyngeal swab, presence of viral mutation(s) within the areas targeted by this assay, and inadequate number of viral copies(<138 copies/mL). A negative  result must be combined with clinical observations, patient history, and epidemiological information. The expected result is Negative.  Fact Sheet for Patients:  bloggercourse.com  Fact Sheet for Healthcare Providers:  seriousbroker.it  This test is no t yet approved or cleared by the [place]  FDA and  has been authorized for detection and/or diagnosis of SARS-CoV-2 by FDA under an Emergency Use Authorization (EUA). This EUA will remain  in effect (meaning this test can be used) for the duration of the COVID-19 declaration under Section 564(b)(1) of the Act, 21 U.S.C.section 360bbb-3(b)(1), unless the authorization is terminated  or revoked sooner.       Influenza A by PCR NEGATIVE NEGATIVE Final   Influenza B by PCR NEGATIVE NEGATIVE Final    Comment: (NOTE) The Xpert Xpress SARS-CoV-2/FLU/RSV plus assay is intended as an aid in the diagnosis of influenza from Nasopharyngeal swab specimens and should not be used as a sole basis for treatment. Nasal washings and aspirates are unacceptable for Xpert Xpress SARS-CoV-2/FLU/RSV testing.  Fact Sheet for Patients: bloggercourse.com  Fact Sheet for Healthcare Providers: seriousbroker.it  This test is not yet approved or cleared by the [place]  FDA and has been authorized for detection and/or diagnosis of SARS-CoV-2 by FDA under an Emergency Use Authorization (EUA). This EUA will remain in effect (meaning this test can be used) for the duration of the COVID-19 declaration under Section 564(b)(1) of the Act, 21 U.S.C. section 360bbb-3(b)(1), unless the authorization is terminated or revoked.     Resp Syncytial Virus by PCR NEGATIVE NEGATIVE Final    Comment: (NOTE) Fact Sheet for Patients: bloggercourse.com  Fact Sheet for Healthcare Providers: seriousbroker.it  This  test is not yet approved or cleared by the [place]  FDA and has been authorized for detection and/or diagnosis of SARS-CoV-2 by FDA under an Emergency Use Authorization (EUA). This EUA will remain in effect (meaning this test can be used) for the duration of the COVID-19 declaration under Section 564(b)(1) of the Act, 21 U.S.C. section 360bbb-3(b)(1), unless the authorization is terminated or revoked.  Performed at [facility], [address] [place], [place] [address]   Expectorated Sputum Assessment w Gram Stain, Rflx to Resp Cult     Status: None   Collection Time: 03/12/24  4:00 PM   Specimen: Salivary Gland; Sputum  Result Value Ref Range Status   Specimen Description SALIVA  Final   Special Requests EXSPU  Final   Sputum evaluation   Final    THIS SPECIMEN IS ACCEPTABLE FOR SPUTUM CULTURE Performed at [facility], [address], [place], [place] [address]    Report Status 03/12/2024 FINAL  Final  Culture, Respiratory w Gram Stain     Status: None (Preliminary result)   Collection Time: 03/12/24  4:00 PM   Specimen: Salivary Gland  Result Value Ref Range Status   Specimen Description   Final    SALIVA Performed at [facility], [address], [place], [place] [address]    Special Requests   Final    EXSPU Reflexed from [phone] Performed at [facility], [address] [place], [place] [address]    Gram Stain   Final    FEW WBC PRESENT, PREDOMINANTLY PMN FEW GRAM POSITIVE COCCI    Culture   Final    CULTURE REINCUBATED FOR BETTER GROWTH Performed at [facility], [address] [place], [place] [address]    Report Status PENDING  Incomplete  CULTURE, BLOOD (ROUTINE X 2) w Reflex to ID Panel     Status: None (Preliminary result)   Collection Time: 03/12/24  5:40 PM  Specimen: BLOOD  Result Value Ref Range Status   Specimen Description BLOOD BLOOD LEFT ARM  Final   Special Requests   Final    BOTTLES DRAWN  AEROBIC AND ANAEROBIC Blood Culture adequate volume   Culture   Final    NO GROWTH 2 DAYS Performed at [facility], [address], [place], [place] [address]    Report Status PENDING  Incomplete  CULTURE, BLOOD (ROUTINE X 2) w Reflex to ID Panel     Status: None (Preliminary result)   Collection Time: 03/12/24  5:40 PM   Specimen: BLOOD  Result Value Ref Range Status   Specimen Description BLOOD BLOOD LEFT HAND  Final   Special Requests   Final    BOTTLES DRAWN AEROBIC AND ANAEROBIC Blood Culture adequate volume   Culture   Final    NO GROWTH 2 DAYS Performed at [facility], [address] [place], [place] [address]    Report Status PENDING  Incomplete  Respiratory (~20 pathogens) panel by PCR     Status: None   Collection Time: 03/13/24  6:55 AM   Specimen: Nasopharyngeal Swab; Respiratory  Result Value Ref Range Status   Adenovirus NOT DETECTED NOT DETECTED Final   Coronavirus 229E NOT DETECTED NOT DETECTED Final    Comment: (NOTE) The Coronavirus on the Respiratory Panel, DOES NOT test for the novel  Coronavirus (2019 nCoV)    Coronavirus HKU1 NOT DETECTED NOT DETECTED Final   Coronavirus NL63 NOT DETECTED NOT DETECTED Final   Coronavirus OC43 NOT DETECTED NOT DETECTED Final   Metapneumovirus NOT DETECTED NOT DETECTED Final   Rhinovirus / Enterovirus NOT DETECTED NOT DETECTED Final   Influenza A NOT DETECTED NOT DETECTED Final   Influenza B NOT DETECTED NOT DETECTED Final   Parainfluenza Virus 1 NOT DETECTED NOT DETECTED Final   Parainfluenza Virus 2 NOT DETECTED NOT DETECTED Final   Parainfluenza Virus 3 NOT DETECTED NOT DETECTED Final   Parainfluenza Virus 4 NOT DETECTED NOT DETECTED Final   Respiratory Syncytial Virus NOT DETECTED NOT DETECTED Final   Bordetella pertussis NOT DETECTED NOT DETECTED Final   Bordetella Parapertussis NOT DETECTED NOT DETECTED Final   Chlamydophila pneumoniae NOT DETECTED NOT DETECTED Final   Mycoplasma pneumoniae  NOT DETECTED NOT DETECTED Final    Comment: Performed at [facility], [address], [place], [place] [address]      Radiology Studies last 3 days: [facility] Chest Port 1 View Result Date: 03/13/2024 CLINICAL DATA:  [id number] Fever [id number]. EXAM: PORTABLE CHEST 1 VIEW COMPARISON:  03/11/2024. FINDINGS: Low lung volume. Bilateral lung fields are clear. Bilateral costophrenic angles are clear. Stable cardio-mediastinal silhouette. No acute osseous abnormalities. The soft tissues are within normal limits. IMPRESSION: No active disease. Electronically Signed   By: Ree Molt M.D.   On: 03/13/2024 10:49   US  Abdomen Limited RUQ (LIVER/GB) Result Date: 03/11/2024 CLINICAL DATA:  [id number] Cirrhosis (HCC) [id number] EXAM: ULTRASOUND ABDOMEN LIMITED RIGHT UPPER QUADRANT COMPARISON:  None Available. FINDINGS: Gallbladder: No gallstones or wall thickening visualized. No sonographic Murphy sign noted by sonographer. Common bile duct: Diameter: Visualized portion measures 6 mm, within normal limits. Liver: No focal lesion identified. Heterogeneous and increased in parenchymal echogenicity with poor acoustic penetration. Portal vein is grossly patent on color Doppler imaging with normal direction of blood flow towards the liver. Other: None. IMPRESSION: Heterogeneous and increased echogenicity of the liver parenchyma as can be seen in hepatic steatosis and/or underlying hepatocellular disease. No focal lesion  identified. Electronically Signed   By: Corean Salter M.D.   On: 03/11/2024 15:32   DG Chest 2 View Result Date: 03/11/2024 CLINICAL DATA:  cough EXAM: CHEST - 2 VIEW COMPARISON:  None Available. FINDINGS: The cardiomediastinal silhouette is the upper limits of normal in contour. No pleural effusion. No pneumothorax. No acute pleuroparenchymal abnormality. Visualized abdomen is unremarkable. No acute osseous abnormality noted. IMPRESSION: No acute cardiopulmonary abnormality. Electronically Signed   By: Corean Salter M.D.   On: 03/11/2024 08:52          Senay Sistrunk, DO [facility] 03/14/2024, 5:46 PM    Dictation software may have been used to generate the above note. Typos may occur and escape review in typed/dictated notes. Please contact Dr Marsa directly for clarity if needed.  Staff may message me via secure chat in Epic  but this may not receive an immediate response,  please page me for urgent matters!  If 7PM-7AM, please contact night coverage [email or web address]       "

## 2024-03-14 NOTE — Plan of Care (Signed)

## 2024-03-15 ENCOUNTER — Inpatient Hospital Stay

## 2024-03-15 LAB — CULTURE, RESPIRATORY W GRAM STAIN: Culture: NORMAL

## 2024-03-15 LAB — CBC
HCT: 38.2 % — ABNORMAL LOW (ref 39.0–52.0)
Hemoglobin: 12.4 g/dL — ABNORMAL LOW (ref 13.0–17.0)
MCH: 27.7 pg (ref 26.0–34.0)
MCHC: 32.5 g/dL (ref 30.0–36.0)
MCV: 85.3 fL (ref 80.0–100.0)
Platelets: 420 10*3/uL — ABNORMAL HIGH (ref 150–400)
RBC: 4.48 MIL/uL (ref 4.22–5.81)
RDW: 14 % (ref 11.5–15.5)
WBC: 9.8 10*3/uL (ref 4.0–10.5)
nRBC: 0 % (ref 0.0–0.2)

## 2024-03-15 LAB — URINALYSIS, COMPLETE (UACMP) WITH MICROSCOPIC
Bilirubin Urine: NEGATIVE
Glucose, UA: NEGATIVE mg/dL
Ketones, ur: NEGATIVE mg/dL
Leukocytes,Ua: NEGATIVE
Nitrite: NEGATIVE
Protein, ur: 100 mg/dL — AB
Specific Gravity, Urine: 1.01 (ref 1.005–1.030)
pH: 5 (ref 5.0–8.0)

## 2024-03-15 LAB — GLUCOSE, CAPILLARY
Glucose-Capillary: 120 mg/dL — ABNORMAL HIGH (ref 70–99)
Glucose-Capillary: 143 mg/dL — ABNORMAL HIGH (ref 70–99)
Glucose-Capillary: 143 mg/dL — ABNORMAL HIGH (ref 70–99)
Glucose-Capillary: 170 mg/dL — ABNORMAL HIGH (ref 70–99)
Glucose-Capillary: 201 mg/dL — ABNORMAL HIGH (ref 70–99)

## 2024-03-15 LAB — PROTEIN / CREATININE RATIO, URINE
Creatinine, Urine: 122 mg/dL
Protein Creatinine Ratio: 1.6 mg/mg — ABNORMAL HIGH
Total Protein, Urine: 199 mg/dL

## 2024-03-15 LAB — BASIC METABOLIC PANEL WITH GFR
Anion gap: 11 (ref 5–15)
BUN: 48 mg/dL — ABNORMAL HIGH (ref 6–20)
CO2: 22 mmol/L (ref 22–32)
Calcium: 7.6 mg/dL — ABNORMAL LOW (ref 8.9–10.3)
Chloride: 97 mmol/L — ABNORMAL LOW (ref 98–111)
Creatinine, Ser: 5.17 mg/dL — ABNORMAL HIGH (ref 0.61–1.24)
GFR, Estimated: 14 mL/min — ABNORMAL LOW
Glucose, Bld: 202 mg/dL — ABNORMAL HIGH (ref 70–99)
Potassium: 4.4 mmol/L (ref 3.5–5.1)
Sodium: 130 mmol/L — ABNORMAL LOW (ref 135–145)

## 2024-03-15 LAB — CREATININE, URINE, RANDOM: Creatinine, Urine: 123 mg/dL

## 2024-03-15 LAB — CK: Total CK: 1321 U/L — ABNORMAL HIGH (ref 49–397)

## 2024-03-15 LAB — SODIUM, URINE, RANDOM: Sodium, Ur: <30 mmol/L

## 2024-03-15 NOTE — Progress Notes (Signed)
 " PROGRESS NOTE    Rickey Payne   FMW:[id number] DOB: [birth year]  DOA: 03/11/2024 Date of Service: 03/15/24 which is hospital day 3  PCP: [name], MD    34 y.o. male with medical history significant for morbid obesity, asthma, diabetes, HTN, HLD who came into ED complaining of aches and pains for the last 10 days.    HPI: Patient had streptococcal throat infection and was treated with antibiotic on 02/28/2024.  Patient completed the antibiotic course but is still having generalized aches and pains and not feeling well.  Patient reports that he feels like his sore throat has improved but he still has some cough which has been getting worse.  He denies any nausea, vomiting, abdominal pain, shortness of breath, leg swelling.  He denies any change of medications.  He uses atorvastatin   for the last 3 years.  Denies any excessive workout or trauma.  Hospital course / significant events: 01/02 Upon arrival to the ED, patient is found to be elevated CPK at 2812 and AKI at 1.69.  Patient was given IV fluid 2 L.  Patient CPK went down to 2252 and AKI improved with creatinine of 1.55.  COVID flu and RSV negative.  Due to patient not feeling well and numbers are not going down significantly, ED provider called the hospitalist team for evaluation for admission for IV fluid hydration 02/02.  Acute hepatitis profile negative. Spiked a fever in the afternoon and blood cultures obtained and started on empiric Rocephin  and doxycycline . CXR no concerns.  2/3.  CK down to 1297 and creatinine down to 1.48.  Continue IV fluid. Resp panel 20 PCR neg.  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   CK on admission 2812 down to 1211 yesterday --> stable today around 1300 Continue IV fluids today.  Hold Lipitor.   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   02/03 yesterday Cr 1.48 --> 02/04 today up to 2.71 nephro consult renal US  urine lytes for FeNa urine protein/Cr UA w/ micro also ASO, andi-DNAse B, C3, C4 Hold nephrotoxic meds incl hydrochlorothiazide , losartan , metformin .   Hypotension - resolved  Held antihypertensive medications and continue IV fluid.   Hyponatremia  Likely d/t AKI / dehydration TSH WNL recently On hydrochlorothiazide  at home  monitor BMP  If persisting tomorrow will broaden workup  Low grade fever - afebrile x24h No obvious infectious cause  Repeat chest x-ray yesterday no concerns Blood cultures initial and repeat, no concerns  EBV c/w past infection but IgM neg for new infection Sputum culture pending Suspect he does have some viral illness potential false negative testing      Uncontrolled type 2 diabetes mellitus with hyperglycemia, without long-term current use of insulin  Holding metformin  with impaired kidney function.   Holding Mounjaro  while in the hospital.   On sliding scale insulin .   Elevated liver enzymes Hepatic steatosis  Acute hepatitis profile negative.  Liver ultrasound showing hepatic steatosis. Monitor labs   OSA (obstructive sleep apnea) CPAP at night          Super morbid obesity based  on BMI: Body mass index is 54.09 kg/m.SABRA Significantly low or high BMI is associated with higher medical risk.  Underweight - under 18  overweight - 25 to 29 obese - 30 or more Class 1 obesity: BMI of 30.0 to 34 Class 2 obesity: BMI of 35.0 to 39 Class 3 obesity: BMI of 40.0 to 49 Super Morbid Obesity: BMI 50-59 Super-super Morbid Obesity: BMI 60+ Healthy nutrition and physical activity advised as adjunct to other disease management  and risk reduction treatments    DVT prophylaxis: heparin  IV fluids: 150 cc/h continuous IV fluids - adjsut per nephrology Nutrition: carb modified Central lines / other devices: none  Code Status: FULL CODE ACP documentation reviewed:  none on file in [facility]  TOC needs: none Medical barriers to dispo at this time: renal fxn. Expected readiness for discharge per TOC at this time:                Subjective / Brief ROS:  Patient reports fatigue, muscle ache Denies CP/SOB.  Pain controlled.  Denies new weakness.  Tolerating diet.  Reports no concerns w/ urination/defecation.   Family Communication: mom at bedside on rounds, pt's wife on speakerphone on rounds     Objective Findings:  Vitals:   03/14/24 1619 03/14/24 1834 03/15/24 0344 03/15/24 0802  BP: (!) 164/104 129/87 127/79 138/84  Pulse: 93 92 97 (!) 101  Resp: 20  18 20   Temp: 97.8 F (36.6 C)  98.8 F (37.1 C) 99.8 F (37.7 C)  TempSrc: Oral  Oral   SpO2: 99%  95% 97%  Weight:      Height:        Intake/Output Summary (Last 24 hours) at 03/15/2024 1603 Last data filed at 03/15/2024 1023 Gross per 24 hour  Intake 120 ml  Output --  Net 120 ml   Filed Weights   03/11/24 0717 03/11/24 1815  Weight: (!) 185.9 kg (!) 191.1 kg    Examination:  Physical Exam Constitutional:      General: He is not in acute distress.    Appearance: He is not ill-appearing.  Cardiovascular:     Rate and Rhythm: Normal rate.  Pulmonary:     Effort: Pulmonary effort is normal.  Abdominal:     Palpations: Abdomen is soft.  Neurological:     General: No focal deficit present.     Mental Status: He is alert and oriented to person, place, and time.  Psychiatric:        Mood and Affect: Mood normal.        Behavior: Behavior normal.          Scheduled Medications:   doxycycline   100 mg Oral Q12H   feeding supplement  237 mL Oral BID BM   heparin   5,000 Units Subcutaneous Q8H   insulin  aspart  0-5  Units Subcutaneous QHS   insulin  aspart  0-9 Units Subcutaneous TID WC   pantoprazole   40 mg Oral BID AC   polyethylene glycol  17 g Oral BID    Continuous Infusions:  sodium chloride  150 mL/hr at 03/15/24 1227   cefTRIAXone  (ROCEPHIN )  IV 2 g (03/14/24 1739)    PRN Medications:  acetaminophen  **OR** acetaminophen , guaiFENesin , hydrALAZINE , loratadine , morphine  injection, ondansetron  **OR** ondansetron  (ZOFRAN ) IV, mouth rinse, oxyCODONE   Antimicrobials from admission:  Anti-infectives (From admission, onward)    Start     Dose/Rate Route Frequency Ordered Stop   03/12/24 2200  doxycycline  (VIBRA -TABS) tablet 100 mg  100 mg Oral Every 12 hours 03/12/24 1637     03/12/24 1800  cefTRIAXone  (ROCEPHIN ) 2 g in sodium chloride  0.9 % 100 mL IVPB        2 g 200 mL/hr over 30 Minutes Intravenous Every 24 hours 03/12/24 1636             Data Reviewed:  I have personally reviewed the following...  CBC: Recent Labs  Lab 03/11/24 0850 03/11/24 1833 03/12/24 0509 03/14/24 0505 03/15/24 0456  WBC 11.1* 8.6 9.9 14.0* 9.8  NEUTROABS 3.6  --   --   --   --   HGB 14.0 12.4* 12.2* 13.4 12.4*  HCT 41.8 36.8* 37.2* 41.1 38.2*  MCV 84.4 84.0 85.1 86.0 85.3  PLT 317 313 323 380 420*   Basic Metabolic Panel: Recent Labs  Lab 03/11/24 1131 03/11/24 1833 03/12/24 0509 03/13/24 0529 03/14/24 0505 03/15/24 1041  NA 132*  --  131* 132* 132* 130*  K 3.6  --  3.9 3.8 4.5 4.4  CL 97*  --  96* 98 96* 97*  CO2 26  --  26 26 25 22   GLUCOSE 101*  --  129* 124* 148* 202*  BUN 20  --  22* 21* 27* 48*  CREATININE 1.55* 1.71* 1.51* 1.48* 2.71* 5.17*  CALCIUM  7.4*  --  7.6* 7.5* 7.7* 7.6*   GFR: Estimated Creatinine Clearance: 35.8 mL/min (A) (by C-G formula based on SCr of 5.17 mg/dL (H)). Liver Function Tests: Recent Labs  Lab 03/11/24 0850 03/11/24 1131 03/12/24 0509  AST 185* 148* 134*  ALT 188* 153* 152*  ALKPHOS 91 75 77  BILITOT 0.7 0.6 0.6  PROT 6.9 5.8* 5.8*   ALBUMIN 3.2* 2.7* 2.7*   No results for input(s): LIPASE, AMYLASE in the last 168 hours. No results for input(s): AMMONIA in the last 168 hours. Coagulation Profile: No results for input(s): INR, PROTIME in the last 168 hours. Cardiac Enzymes: Recent Labs  Lab 03/11/24 1131 03/12/24 0509 03/13/24 0529 03/14/24 0505 03/15/24 1041  CKTOTAL 2,252* 1,759* 1,297* 1,211* 1,321*   BNP (last 3 results) No results for input(s): PROBNP in the last 8760 hours. HbA1C: No results for input(s): HGBA1C in the last 72 hours.  CBG: Recent Labs  Lab 03/14/24 1204 03/14/24 1614 03/14/24 2032 03/15/24 0803 03/15/24 1208  GLUCAP 168* 180* 139* 120* 170*   Lipid Profile: No results for input(s): CHOL, HDL, LDLCALC, TRIG, CHOLHDL, LDLDIRECT in the last 72 hours. Thyroid Function Tests: No results for input(s): TSH, T4TOTAL, FREET4, T3FREE, THYROIDAB in the last 72 hours. Anemia Panel: No results for input(s): VITAMINB12, FOLATE, FERRITIN, TIBC, IRON, RETICCTPCT in the last 72 hours. Most Recent Urinalysis On File:     Component Value Date/Time   COLORURINE YELLOW (A) 03/11/2024 0850   APPEARANCEUR HAZY (A) 03/11/2024 0850   APPEARANCEUR Clear 06/20/2019 1056   LABSPEC 1.012 03/11/2024 0850   PHURINE 5.0 03/11/2024 0850   GLUCOSEU NEGATIVE 03/11/2024 0850   GLUCOSEU NEGATIVE 05/20/2017 1126   HGBUR MODERATE (A) 03/11/2024 0850   BILIRUBINUR NEGATIVE 03/11/2024 0850   BILIRUBINUR Negative 06/20/2019 1056   KETONESUR NEGATIVE 03/11/2024 0850   PROTEINUR 100 (A) 03/11/2024 0850   UROBILINOGEN 0.2 05/20/2017 1126   NITRITE NEGATIVE 03/11/2024 0850   LEUKOCYTESUR NEGATIVE 03/11/2024 0850   Sepsis Labs: @LABRCNTIP (procalcitonin:4,lacticidven:4) Microbiology: Recent Results (from the past 240 hours)  Resp panel by RT-PCR (RSV, Flu A&B, Covid) Anterior Nasal Swab     Status: None   Collection Time: 03/11/24  7:37 AM   Specimen: Anterior  Nasal Swab  Result Value Ref Range Status   SARS Coronavirus 2 by RT PCR NEGATIVE NEGATIVE Final    Comment: (NOTE) SARS-CoV-2 target nucleic acids are NOT DETECTED.  The SARS-CoV-2 RNA is generally detectable in upper respiratory specimens during the acute phase of infection. The lowest concentration of SARS-CoV-2 viral copies this assay can detect is 138 copies/mL. A negative result does not preclude SARS-Cov-2 infection and should not be used as the sole basis for treatment or other patient management decisions. A negative result may occur with  improper specimen collection/handling, submission of specimen other than nasopharyngeal swab, presence of viral mutation(s) within the areas targeted by this assay, and inadequate number of viral copies(<138 copies/mL). A negative result must be combined with clinical observations, patient history, and epidemiological information. The expected result is Negative.  Fact Sheet for Patients:  bloggercourse.com  Fact Sheet for Healthcare Providers:  seriousbroker.it  This test is no t yet approved or cleared by the [place]  FDA and  has been authorized for detection and/or diagnosis of SARS-CoV-2 by FDA under an Emergency Use Authorization (EUA). This EUA will remain  in effect (meaning this test can be used) for the duration of the COVID-19 declaration under Section 564(b)(1) of the Act, 21 U.S.C.section 360bbb-3(b)(1), unless the authorization is terminated  or revoked sooner.       Influenza A by PCR NEGATIVE NEGATIVE Final   Influenza B by PCR NEGATIVE NEGATIVE Final    Comment: (NOTE) The Xpert Xpress SARS-CoV-2/FLU/RSV plus assay is intended as an aid in the diagnosis of influenza from Nasopharyngeal swab specimens and should not be used as a sole basis for treatment. Nasal washings and aspirates are unacceptable for Xpert Xpress SARS-CoV-2/FLU/RSV testing.  Fact Sheet for  Patients: bloggercourse.com  Fact Sheet for Healthcare Providers: seriousbroker.it  This test is not yet approved or cleared by the [place]  FDA and has been authorized for detection and/or diagnosis of SARS-CoV-2 by FDA under an Emergency Use Authorization (EUA). This EUA will remain in effect (meaning this test can be used) for the duration of the COVID-19 declaration under Section 564(b)(1) of the Act, 21 U.S.C. section 360bbb-3(b)(1), unless the authorization is terminated or revoked.     Resp Syncytial Virus by PCR NEGATIVE NEGATIVE Final    Comment: (NOTE) Fact Sheet for Patients: bloggercourse.com  Fact Sheet for Healthcare Providers: seriousbroker.it  This test is not yet approved or cleared by the [place]  FDA and has been authorized for detection and/or diagnosis of SARS-CoV-2 by FDA under an Emergency Use Authorization (EUA). This EUA will remain in effect (meaning this test can be used) for the duration of the COVID-19 declaration under Section 564(b)(1) of the Act, 21 U.S.C. section 360bbb-3(b)(1), unless the authorization is terminated or revoked.  Performed at [facility], [address] [place], [place] [address]   Expectorated Sputum Assessment w Gram Stain, Rflx to Resp Cult     Status: None   Collection Time: 03/12/24  4:00 PM   Specimen: Salivary Gland; Sputum  Result Value Ref Range Status   Specimen Description SALIVA  Final   Special Requests EXSPU  Final   Sputum evaluation   Final    THIS SPECIMEN IS ACCEPTABLE FOR SPUTUM CULTURE Performed at [facility], [address], [place], [place] [address]    Report Status 03/12/2024 FINAL  Final  Culture, Respiratory w Gram Stain     Status: None   Collection  Time: 03/12/24  4:00 PM   Specimen: Salivary Gland  Result Value Ref Range Status   Specimen Description   Final     SALIVA Performed at [facility], [address] [place], [place] [address]    Special Requests   Final    EXSPU Reflexed from [id number] Performed at [facility], [address] [place], [place] [address]    Gram Stain   Final    FEW WBC PRESENT, PREDOMINANTLY PMN FEW GRAM POSITIVE COCCI    Culture   Final    MODERATE Normal respiratory flora-no Staph aureus or Pseudomonas seen Performed at [facility], [address], [place], [place] [address]    Report Status 03/15/2024 FINAL  Final  CULTURE, BLOOD (ROUTINE X 2) w Reflex to ID Panel     Status: None (Preliminary result)   Collection Time: 03/12/24  5:40 PM   Specimen: BLOOD  Result Value Ref Range Status   Specimen Description BLOOD BLOOD LEFT ARM  Final   Special Requests   Final    BOTTLES DRAWN AEROBIC AND ANAEROBIC Blood Culture adequate volume   Culture   Final    NO GROWTH 3 DAYS Performed at [facility], [address] [place], [place] [address]    Report Status PENDING  Incomplete  CULTURE, BLOOD (ROUTINE X 2) w Reflex to ID Panel     Status: None (Preliminary result)   Collection Time: 03/12/24  5:40 PM   Specimen: BLOOD  Result Value Ref Range Status   Specimen Description BLOOD BLOOD LEFT HAND  Final   Special Requests   Final    BOTTLES DRAWN AEROBIC AND ANAEROBIC Blood Culture adequate volume   Culture   Final    NO GROWTH 3 DAYS Performed at [facility], [address] [place], [place] [address]    Report Status PENDING  Incomplete  Respiratory (~20 pathogens) panel by PCR     Status: None   Collection Time: 03/13/24  6:55 AM   Specimen: Nasopharyngeal Swab; Respiratory  Result Value Ref Range Status   Adenovirus NOT DETECTED NOT DETECTED Final   Coronavirus 229E NOT DETECTED NOT DETECTED Final    Comment: (NOTE) The Coronavirus on the Respiratory Panel, DOES NOT test for the novel  Coronavirus (2019 nCoV)    Coronavirus HKU1 NOT DETECTED NOT  DETECTED Final   Coronavirus NL63 NOT DETECTED NOT DETECTED Final   Coronavirus OC43 NOT DETECTED NOT DETECTED Final   Metapneumovirus NOT DETECTED NOT DETECTED Final   Rhinovirus / Enterovirus NOT DETECTED NOT DETECTED Final   Influenza A NOT DETECTED NOT DETECTED Final   Influenza B NOT DETECTED NOT DETECTED Final   Parainfluenza Virus 1 NOT DETECTED NOT DETECTED Final   Parainfluenza Virus 2 NOT DETECTED NOT DETECTED Final   Parainfluenza Virus 3 NOT DETECTED NOT DETECTED Final   Parainfluenza Virus 4 NOT DETECTED NOT DETECTED Final   Respiratory Syncytial Virus NOT DETECTED NOT DETECTED Final   Bordetella pertussis NOT DETECTED NOT DETECTED Final   Bordetella Parapertussis NOT DETECTED NOT DETECTED Final   Chlamydophila pneumoniae NOT DETECTED NOT DETECTED Final   Mycoplasma pneumoniae NOT DETECTED NOT DETECTED Final    Comment: Performed at [facility], [address] [place], [place] [address]      Radiology Studies last 3 days: US  RENAL Result Date: 03/15/2024 CLINICAL DATA:  Acute kidney injury EXAM: RENAL / URINARY TRACT ULTRASOUND COMPLETE COMPARISON:  None available FINDINGS: Right Kidney:  Renal measurements: 15.4 x 9.2 x 10.0 cm = volume: 71 mL. Increased echogenicity the renal cortex without significant thinning. No hydronephrosis. No mass. Left Kidney: Renal measurements: 15.4 x 9.5 x 8.0 cm = volume: 610 mL. Increased echogenicity the renal cortex without significant thinning. Bladder: Appears normal for degree of bladder distention. Other: None. IMPRESSION: 1. Increased echogenicity the renal cortex without significant thinning consistent with acute kidney injury. 2. No hydronephrosis. Electronically Signed   By: Aliene Lloyd M.D.   On: 03/15/2024 15:45   DG Chest Port 1 View Result Date: 03/13/2024 CLINICAL DATA:  [id number] Fever [id number]. EXAM: PORTABLE CHEST 1 VIEW COMPARISON:  03/11/2024. FINDINGS: Low lung volume. Bilateral lung fields are clear. Bilateral costophrenic  angles are clear. Stable cardio-mediastinal silhouette. No acute osseous abnormalities. The soft tissues are within normal limits. IMPRESSION: No active disease. Electronically Signed   By: Ree Molt M.D.   On: 03/13/2024 10:49          Elman Dettman, DO [facility] 03/15/2024, 4:03 PM    Dictation software may have been used to generate the above note. Typos may occur and escape review in typed/dictated notes. Please contact Dr Marsa directly for clarity if needed.  Staff may message me via secure chat in Epic  but this may not receive an immediate response,  please page me for urgent matters!  If 7PM-7AM, please contact night coverage [email or web address]       "

## 2024-03-15 NOTE — Plan of Care (Signed)

## 2024-03-15 NOTE — TOC Progression Note (Signed)
 Transition of Care [facility]) - Progression Note    Patient Details  Name: Rickey Payne MRN: [id number] Date of Birth: 1990/02/28  [facility] CM/SW Contact  Dalia GORMAN Fuse, RN Phone Number: 03/15/2024, 9:36 AM  Clinical Narrative:    Patient with antibodies for ebstein barr. BMP and CK pending. Remains on IVF @ 150 ml/hr.  TOC will continue to follow.                     Expected Discharge Plan and Services                                               Social Drivers of Health (SDOH) Interventions SDOH Screenings   Food Insecurity: Food Insecurity Present (03/11/2024)  Housing: Low Risk (03/11/2024)  Transportation Needs: No Transportation Needs (03/11/2024)  Utilities: Patient Declined (03/11/2024)  Depression (PHQ2-9): Low Risk (11/08/2023)  Social Connections: Unknown (03/11/2024)  Tobacco Use: Low Risk (03/11/2024)    Readmission Risk Interventions     No data to display

## 2024-03-16 LAB — BASIC METABOLIC PANEL WITH GFR
Anion gap: 11 (ref 5–15)
BUN: 57 mg/dL — ABNORMAL HIGH (ref 6–20)
CO2: 23 mmol/L (ref 22–32)
Calcium: 7.6 mg/dL — ABNORMAL LOW (ref 8.9–10.3)
Chloride: 98 mmol/L (ref 98–111)
Creatinine, Ser: 6.52 mg/dL — ABNORMAL HIGH (ref 0.61–1.24)
GFR, Estimated: 11 mL/min — ABNORMAL LOW
Glucose, Bld: 114 mg/dL — ABNORMAL HIGH (ref 70–99)
Potassium: 4.4 mmol/L (ref 3.5–5.1)
Sodium: 131 mmol/L — ABNORMAL LOW (ref 135–145)

## 2024-03-16 LAB — HEPATIC FUNCTION PANEL
ALT: 165 U/L — ABNORMAL HIGH (ref 0–44)
AST: 156 U/L — ABNORMAL HIGH (ref 15–41)
Albumin: 2.2 g/dL — ABNORMAL LOW (ref 3.5–5.0)
Alkaline Phosphatase: 98 U/L (ref 38–126)
Bilirubin, Direct: 0.2 mg/dL (ref 0.0–0.2)
Indirect Bilirubin: 0.2 mg/dL — ABNORMAL LOW (ref 0.3–0.9)
Total Bilirubin: 0.3 mg/dL (ref 0.0–1.2)
Total Protein: 5.6 g/dL — ABNORMAL LOW (ref 6.5–8.1)

## 2024-03-16 LAB — CULTURE, BLOOD (ROUTINE X 2)
Culture: NO GROWTH
Culture: NO GROWTH
Special Requests: ADEQUATE
Special Requests: ADEQUATE

## 2024-03-16 LAB — GLUCOSE, CAPILLARY
Glucose-Capillary: 116 mg/dL — ABNORMAL HIGH (ref 70–99)
Glucose-Capillary: 123 mg/dL — ABNORMAL HIGH (ref 70–99)
Glucose-Capillary: 216 mg/dL — ABNORMAL HIGH (ref 70–99)
Glucose-Capillary: 181 mg/dL — ABNORMAL HIGH (ref 70–99)
Glucose-Capillary: 171 mg/dL — ABNORMAL HIGH (ref 70–99)

## 2024-03-16 LAB — CK: Total CK: 1189 U/L — ABNORMAL HIGH (ref 49–397)

## 2024-03-16 MED ORDER — NEPRO/CARBSTEADY PO LIQD
237.0000 mL | Freq: Two times a day (BID) | ORAL | Status: AC
  Administered 2024-03-16 (×2): 237 mL via ORAL

## 2024-03-16 MED ORDER — SODIUM CHLORIDE 0.9 % IV SOLN: INTRAVENOUS | Status: AC

## 2024-03-16 NOTE — Plan of Care (Signed)
" °  Problem: Coping: Goal: Ability to adjust to condition or change in health will improve Outcome: Progressing   Problem: Health Behavior/Discharge Planning: Goal: Ability to manage health-related needs will improve Outcome: Progressing   Problem: Skin Integrity: Goal: Risk for impaired skin integrity will decrease Outcome: Progressing   Problem: Health Behavior/Discharge Planning: Goal: Ability to manage health-related needs will improve Outcome: Progressing   "

## 2024-03-16 NOTE — Progress Notes (Signed)
 " PROGRESS NOTE    Rickey Payne   FMW:[id number] DOB: 12/20/1990  DOA: 03/11/2024 Date of Service: 03/16/24 which is hospital day 4  PCP: [name], MD    34 y.o. male with medical history significant for morbid obesity, asthma, diabetes, HTN, HLD who came into ED complaining of aches and pains for the last 10 days.    HPI: Patient had streptococcal throat infection and was treated with antibiotic on 02/28/2024.  Patient completed the antibiotic course but is still having generalized aches and pains and not feeling well.  Patient reports that he feels like his sore throat has improved but he still has some cough which has been getting worse.  He denies any nausea, vomiting, abdominal pain, shortness of breath, leg swelling.  He denies any change of medications.  He uses atorvastatin   for the last 3 years.  Denies any excessive workout or trauma.  Hospital course / significant events: 01/02: Upon arrival to the ED, patient is found to be elevated CPK at 2812 and AKI at 1.69.  Patient was given IV fluid 2 L.  Patient CPK went down to 2252 and AKI improved with creatinine of 1.55.  COVID flu and RSV negative.  Due to patient not feeling well and numbers are not going down significantly, ED provider called the hospitalist team for evaluation for admission for IV fluid hydration 02/02.  Acute hepatitis profile negative. Spiked a fever in the afternoon and blood cultures obtained and started on empiric Rocephin  and doxycycline . CXR no concerns.  02/03.  CK down to 1297 and creatinine down to 1.48.  Continue IV fluid. Resp panel 20 PCR neg.  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  Creatinine 1.69 on presentation 02/01. Trend: 02/03 Cr 1.48 --> 02/04 up to 2.71 --> 02/05 Cr 5.17 (consult to nephro) --> 02/06 Cr 6.52 and GFR 11 Renal US  no obstruction nephro consult Awaiting pending serologies which include ANA, ANCA, complements, kappa light chains, GBM, ASO.  No biopsy or dialysis at this time  Continue IV fluids, will decrease rate to 100 mL/h. Patient encouraged to maintain oral intake.  Nepro supplementation twice daily. Hold nephrotoxic meds incl hydrochlorothiazide , losartan , metformin .  Rhabdomyolysis IV fluid hydration.   Continue IV fluids per nephrology.  Hold Lipitor.   Hypotension - resolved  Held antihypertensive medications and continue IV fluid.   Hyponatremia  Likely d/t AKI / dehydration TSH WNL recently On hydrochlorothiazide  at home  monitor BMP  Fluids as above   Low grade fever - afebrile x24h No obvious infectious cause  Blood cultures initial and repeat, no concerns  EBV c/w past infection but IgM neg for new infection Sputum culture pending Suspect given intermittent fever / body aches, he does have unspecified viral illness potential false negative testing      Uncontrolled type 2 diabetes mellitus with hyperglycemia, without long-term current use of insulin  Holding metformin   with impaired kidney function.   Holding Mounjaro  while in the hospital.   On sliding scale insulin .   Elevated liver enzymes Hepatic steatosis  Acute hepatitis profile negative.  Liver ultrasound showing hepatic steatosis. Monitor labs   OSA (obstructive sleep apnea) CPAP at night          Super morbid obesity based on BMI: Body mass index is 54.09 kg/m.SABRA Significantly low or high BMI is associated  with higher medical risk.  Underweight - under 18  overweight - 25 to 29 obese - 30 or more Class 1 obesity: BMI of 30.0 to 34 Class 2 obesity: BMI of 35.0 to 39 Class 3 obesity: BMI of 40.0 to 49 Super Morbid Obesity: BMI 50-59 Super-super Morbid Obesity: BMI 60+ Healthy nutrition and physical activity advised as adjunct to other disease management and risk reduction treatments    DVT prophylaxis: heparin  IV fluids: 150 cc/h continuous IV fluids - adjsut per nephrology Nutrition: carb modified Central lines / other devices: none  Code Status: FULL CODE ACP documentation reviewed:  none on file in [facility]  TOC needs: none Medical barriers to dispo at this time: renal fxn. Expected readiness for discharge per TOC at this time:                Subjective / Brief ROS:  Patient reports fatigue, muscle ache about same Not hungry  Denies CP/SOB.  Pain controlled.  Denies new weakness.    Family Communication: call to wife Rickey Payne  03/16/24 4:12 PM concerned about the fluid bulidup but no other questions     Objective Findings:  Vitals:   03/15/24 1631 03/15/24 2016 03/16/24 0420 03/16/24 0744  BP: 113/71 (!) 128/98 126/71 (!) 147/88  Pulse: (!) 102 (!) 103 98 (!) 107  Resp: 18  17 20   Temp: 100.2 F (37.9 C) 99.6 F (37.6 C) 98.9 F (37.2 C) 99.5 F (37.5 C)  TempSrc:  Oral Oral Oral  SpO2: 97% 95% 95% 96%  Weight:      Height:        Intake/Output Summary (Last 24 hours) at 03/16/2024 1611 Last data filed at 03/16/2024 1300 Gross per 24 hour  Intake 240 ml  Output --  Net 240 ml   Filed Weights   03/11/24 0717 03/11/24 1815  Weight: (!) 185.9 kg (!) 191.1 kg    Examination:  Physical Exam Constitutional:      General: He is not in acute distress.    Appearance: He is not ill-appearing.  Cardiovascular:     Rate and Rhythm: Normal rate.  Pulmonary:     Effort: Pulmonary effort is normal.  Abdominal:     Palpations: Abdomen is soft.   Neurological:     General: No focal deficit present.     Mental Status: He is alert and oriented to person, place, and time.  Psychiatric:        Mood and Affect: Mood normal.        Behavior: Behavior normal.          Scheduled Medications:   doxycycline   100 mg Oral Q12H   feeding supplement (NEPRO CARB STEADY)  237 mL Oral BID BM   heparin   5,000 Units Subcutaneous Q8H   insulin  aspart  0-5 Units Subcutaneous QHS   insulin  aspart  0-9 Units Subcutaneous TID WC   pantoprazole   40 mg Oral BID AC   polyethylene glycol  17 g Oral BID    Continuous Infusions:  sodium chloride   cefTRIAXone  (ROCEPHIN )  IV 2 g (03/15/24 1811)    PRN Medications:  acetaminophen  **OR** acetaminophen , guaiFENesin , hydrALAZINE , loratadine , morphine  injection, ondansetron  **OR** ondansetron  (ZOFRAN ) IV, mouth rinse, oxyCODONE   Antimicrobials from admission:  Anti-infectives (From admission, onward)    Start     Dose/Rate Route Frequency Ordered Stop   03/12/24 2200  doxycycline  (VIBRA -TABS) tablet 100 mg        100 mg Oral Every 12 hours 03/12/24 1637     03/12/24 1800  cefTRIAXone  (ROCEPHIN ) 2 g in sodium chloride  0.9 % 100 mL IVPB        2 g 200 mL/hr over 30 Minutes Intravenous Every 24 hours 03/12/24 1636             Data Reviewed:  I have personally reviewed the following...  CBC: Recent Labs  Lab 03/11/24 0850 03/11/24 1833 03/12/24 0509 03/14/24 0505 03/15/24 0456  WBC 11.1* 8.6 9.9 14.0* 9.8  NEUTROABS 3.6  --   --   --   --   HGB 14.0 12.4* 12.2* 13.4 12.4*  HCT 41.8 36.8* 37.2* 41.1 38.2*  MCV 84.4 84.0 85.1 86.0 85.3  PLT 317 313 323 380 420*   Basic Metabolic Panel: Recent Labs  Lab 03/12/24 0509 03/13/24 0529 03/14/24 0505 03/15/24 1041 03/16/24 0451  NA 131* 132* 132* 130* 131*  K 3.9 3.8 4.5 4.4 4.4  CL 96* 98 96* 97* 98  CO2 26 26 25 22 23   GLUCOSE 129* 124* 148* 202* 114*  BUN 22* 21* 27* 48* 57*  CREATININE 1.51* 1.48* 2.71* 5.17* 6.52*   CALCIUM  7.6* 7.5* 7.7* 7.6* 7.6*   GFR: Estimated Creatinine Clearance: 28.4 mL/min (A) (by C-G formula based on SCr of 6.52 mg/dL (H)). Liver Function Tests: Recent Labs  Lab 03/11/24 0850 03/11/24 1131 03/12/24 0509 03/16/24 0450  AST 185* 148* 134* 156*  ALT 188* 153* 152* 165*  ALKPHOS 91 75 77 98  BILITOT 0.7 0.6 0.6 0.3  PROT 6.9 5.8* 5.8* 5.6*  ALBUMIN 3.2* 2.7* 2.7* 2.2*   No results for input(s): LIPASE, AMYLASE in the last 168 hours. No results for input(s): AMMONIA in the last 168 hours. Coagulation Profile: No results for input(s): INR, PROTIME in the last 168 hours. Cardiac Enzymes: Recent Labs  Lab 03/12/24 0509 03/13/24 0529 03/14/24 0505 03/15/24 1041 03/16/24 0450  CKTOTAL 1,759* 1,297* 1,211* 1,321* 1,189*   BNP (last 3 results) No results for input(s): PROBNP in the last 8760 hours. HbA1C: No results for input(s): HGBA1C in the last 72 hours.  CBG: Recent Labs  Lab 03/15/24 2017 03/15/24 2353 03/16/24 0405 03/16/24 0742 03/16/24 1124  GLUCAP 201* 143* 116* 123* 216*   Lipid Profile: No results for input(s): CHOL, HDL, LDLCALC, TRIG, CHOLHDL, LDLDIRECT in the last 72 hours. Thyroid Function Tests: No results for input(s): TSH, T4TOTAL, FREET4, T3FREE, THYROIDAB in the last 72 hours. Anemia Panel: No results for input(s): VITAMINB12, FOLATE, FERRITIN, TIBC, IRON, RETICCTPCT in the last 72 hours. Most Recent Urinalysis On File:     Component Value Date/Time   COLORURINE YELLOW (A) 03/15/2024 1555   APPEARANCEUR CLOUDY (A) 03/15/2024 1555   APPEARANCEUR Clear 06/20/2019 1056   LABSPEC 1.010 03/15/2024 1555   PHURINE 5.0 03/15/2024 1555   GLUCOSEU NEGATIVE 03/15/2024 1555   GLUCOSEU NEGATIVE 05/20/2017 1126   HGBUR LARGE (A) 03/15/2024 1555   BILIRUBINUR NEGATIVE 03/15/2024 1555   BILIRUBINUR Negative 06/20/2019 1056   KETONESUR NEGATIVE 03/15/2024 1555   PROTEINUR 100 (A) 03/15/2024  1555  UROBILINOGEN 0.2 05/20/2017 1126   NITRITE NEGATIVE 03/15/2024 1555   LEUKOCYTESUR NEGATIVE 03/15/2024 1555   Sepsis Labs: @LABRCNTIP (procalcitonin:4,lacticidven:4) Microbiology: Recent Results (from the past 240 hours)  Resp panel by RT-PCR (RSV, Flu A&B, Covid) Anterior Nasal Swab     Status: None   Collection Time: 03/11/24  7:37 AM   Specimen: Anterior Nasal Swab  Result Value Ref Range Status   SARS Coronavirus 2 by RT PCR NEGATIVE NEGATIVE Final    Comment: (NOTE) SARS-CoV-2 target nucleic acids are NOT DETECTED.  The SARS-CoV-2 RNA is generally detectable in upper respiratory specimens during the acute phase of infection. The lowest concentration of SARS-CoV-2 viral copies this assay can detect is 138 copies/mL. A negative result does not preclude SARS-Cov-2 infection and should not be used as the sole basis for treatment or other patient management decisions. A negative result may occur with  improper specimen collection/handling, submission of specimen other than nasopharyngeal swab, presence of viral mutation(s) within the areas targeted by this assay, and inadequate number of viral copies(<138 copies/mL). A negative result must be combined with clinical observations, patient history, and epidemiological information. The expected result is Negative.  Fact Sheet for Patients:  bloggercourse.com  Fact Sheet for Healthcare Providers:  seriousbroker.it  This test is no t yet approved or cleared by the [place]  FDA and  has been authorized for detection and/or diagnosis of SARS-CoV-2 by FDA under an Emergency Use Authorization (EUA). This EUA will remain  in effect (meaning this test can be used) for the duration of the COVID-19 declaration under Section 564(b)(1) of the Act, 21 U.S.C.section 360bbb-3(b)(1), unless the authorization is terminated  or revoked sooner.       Influenza A by PCR NEGATIVE  NEGATIVE Final   Influenza B by PCR NEGATIVE NEGATIVE Final    Comment: (NOTE) The Xpert Xpress SARS-CoV-2/FLU/RSV plus assay is intended as an aid in the diagnosis of influenza from Nasopharyngeal swab specimens and should not be used as a sole basis for treatment. Nasal washings and aspirates are unacceptable for Xpert Xpress SARS-CoV-2/FLU/RSV testing.  Fact Sheet for Patients: bloggercourse.com  Fact Sheet for Healthcare Providers: seriousbroker.it  This test is not yet approved or cleared by the [place]  FDA and has been authorized for detection and/or diagnosis of SARS-CoV-2 by FDA under an Emergency Use Authorization (EUA). This EUA will remain in effect (meaning this test can be used) for the duration of the COVID-19 declaration under Section 564(b)(1) of the Act, 21 U.S.C. section 360bbb-3(b)(1), unless the authorization is terminated or revoked.     Resp Syncytial Virus by PCR NEGATIVE NEGATIVE Final    Comment: (NOTE) Fact Sheet for Patients: bloggercourse.com  Fact Sheet for Healthcare Providers: seriousbroker.it  This test is not yet approved or cleared by the [place]  FDA and has been authorized for detection and/or diagnosis of SARS-CoV-2 by FDA under an Emergency Use Authorization (EUA). This EUA will remain in effect (meaning this test can be used) for the duration of the COVID-19 declaration under Section 564(b)(1) of the Act, 21 U.S.C. section 360bbb-3(b)(1), unless the authorization is terminated or revoked.  Performed at [facility], [address] [place], [place] [address]   Expectorated Sputum Assessment w Gram Stain, Rflx to Resp Cult     Status: None   Collection Time: 03/12/24  4:00 PM   Specimen: Salivary Gland; Sputum  Result Value Ref Range Status   Specimen Description SALIVA  Final   Special Requests EXSPU  Final  Sputum evaluation   Final    THIS SPECIMEN IS ACCEPTABLE FOR SPUTUM CULTURE Performed at [facility], [address] [place], [place] [address]    Report Status 03/12/2024 FINAL  Final  Culture, Respiratory w Gram Stain     Status: None   Collection Time: 03/12/24  4:00 PM   Specimen: Salivary Gland  Result Value Ref Range Status   Specimen Description   Final    SALIVA Performed at [facility], [address], [place], [place] [address]    Special Requests   Final    EXSPU Reflexed from [id number] Performed at [facility], [address] [place], [place] [address]    Gram Stain   Final    FEW WBC PRESENT, PREDOMINANTLY PMN FEW GRAM POSITIVE COCCI    Culture   Final    MODERATE Normal respiratory flora-no Staph aureus or Pseudomonas seen Performed at [facility], [address], [place], [place] [address]    Report Status 03/15/2024 FINAL  Final  CULTURE, BLOOD (ROUTINE X 2) w Reflex to ID Panel     Status: None (Preliminary result)   Collection Time: 03/12/24  5:40 PM   Specimen: BLOOD  Result Value Ref Range Status   Specimen Description BLOOD BLOOD LEFT ARM  Final   Special Requests   Final    BOTTLES DRAWN AEROBIC AND ANAEROBIC Blood Culture adequate volume   Culture   Final    NO GROWTH 4 DAYS Performed at [facility], [address], [place], [place] [address]    Report Status PENDING  Incomplete  CULTURE, BLOOD (ROUTINE X 2) w Reflex to ID Panel     Status: None (Preliminary result)   Collection Time: 03/12/24  5:40 PM   Specimen: BLOOD  Result Value Ref Range Status   Specimen Description BLOOD BLOOD LEFT HAND  Final   Special Requests   Final    BOTTLES DRAWN AEROBIC AND ANAEROBIC Blood Culture adequate volume   Culture   Final    NO GROWTH 4 DAYS Performed at [facility], [address] [place], [place] [address]    Report Status PENDING  Incomplete  Respiratory (~20 pathogens) panel by PCR      Status: None   Collection Time: 03/13/24  6:55 AM   Specimen: Nasopharyngeal Swab; Respiratory  Result Value Ref Range Status   Adenovirus NOT DETECTED NOT DETECTED Final   Coronavirus 229E NOT DETECTED NOT DETECTED Final    Comment: (NOTE) The Coronavirus on the Respiratory Panel, DOES NOT test for the novel  Coronavirus (2019 nCoV)    Coronavirus HKU1 NOT DETECTED NOT DETECTED Final   Coronavirus NL63 NOT DETECTED NOT DETECTED Final   Coronavirus OC43 NOT DETECTED NOT DETECTED Final   Metapneumovirus NOT DETECTED NOT DETECTED Final   Rhinovirus / Enterovirus NOT DETECTED NOT DETECTED Final   Influenza A NOT DETECTED NOT DETECTED Final   Influenza B NOT DETECTED NOT DETECTED Final   Parainfluenza Virus 1 NOT DETECTED NOT DETECTED Final   Parainfluenza Virus 2 NOT DETECTED NOT DETECTED Final   Parainfluenza Virus 3 NOT DETECTED NOT DETECTED Final   Parainfluenza Virus 4 NOT DETECTED NOT DETECTED Final   Respiratory Syncytial Virus NOT DETECTED NOT DETECTED Final   Bordetella pertussis NOT DETECTED NOT DETECTED Final   Bordetella Parapertussis NOT DETECTED NOT DETECTED Final   Chlamydophila pneumoniae NOT DETECTED NOT DETECTED Final   Mycoplasma pneumoniae NOT DETECTED NOT DETECTED Final  Comment: Performed at [facility], [address] [place], [place] [address]      Radiology Studies last 3 days: US  RENAL Result Date: 03/15/2024 CLINICAL DATA:  Acute kidney injury EXAM: RENAL / URINARY TRACT ULTRASOUND COMPLETE COMPARISON:  None available FINDINGS: Right Kidney: Renal measurements: 15.4 x 9.2 x 10.0 cm = volume: 71 mL. Increased echogenicity the renal cortex without significant thinning. No hydronephrosis. No mass. Left Kidney: Renal measurements: 15.4 x 9.5 x 8.0 cm = volume: 610 mL. Increased echogenicity the renal cortex without significant thinning. Bladder: Appears normal for degree of bladder distention. Other: None. IMPRESSION: 1. Increased echogenicity the renal  cortex without significant thinning consistent with acute kidney injury. 2. No hydronephrosis. Electronically Signed   By: Aliene Lloyd M.D.   On: 03/15/2024 15:45   DG Chest Port 1 View Result Date: 03/13/2024 CLINICAL DATA:  [id number] Fever [id number]. EXAM: PORTABLE CHEST 1 VIEW COMPARISON:  03/11/2024. FINDINGS: Low lung volume. Bilateral lung fields are clear. Bilateral costophrenic angles are clear. Stable cardio-mediastinal silhouette. No acute osseous abnormalities. The soft tissues are within normal limits. IMPRESSION: No active disease. Electronically Signed   By: Ree Molt M.D.   On: 03/13/2024 10:49          Carliss Quast, DO [facility] 03/16/2024, 4:11 PM    Dictation software may have been used to generate the above note. Typos may occur and escape review in typed/dictated notes. Please contact Dr Marsa directly for clarity if needed.  Staff may message me via secure chat in Epic  but this may not receive an immediate response,  please page me for urgent matters!  If 7PM-7AM, please contact night coverage [email or web address]       "

## 2024-03-16 NOTE — Progress Notes (Incomplete)
 " PROGRESS NOTE    Rickey Payne   FMW:[id number] DOB: Sep 25, 1990  DOA: 03/11/2024 Date of Service: 03/16/24 which is hospital day 4  PCP: [name], MD    34 y.o. male with medical history significant for morbid obesity, asthma, diabetes, HTN, HLD who came into ED complaining of aches and pains for the last 10 days.    HPI: Patient had streptococcal throat infection and was treated with antibiotic on 02/28/2024.  Patient completed the antibiotic course but is still having generalized aches and pains and not feeling well.  Patient reports that he feels like his sore throat has improved but he still has some cough which has been getting worse.  He denies any nausea, vomiting, abdominal pain, shortness of breath, leg swelling.  He denies any change of medications.  He uses atorvastatin   for the last 3 years.  Denies any excessive workout or trauma.  Hospital course / significant events: 01/02: Upon arrival to the ED, patient is found to be elevated CPK at 2812 and AKI at 1.69.  Patient was given IV fluid 2 L.  Patient CPK went down to 2252 and AKI improved with creatinine of 1.55.  COVID flu and RSV negative.  Due to patient not feeling well and numbers are not going down significantly, ED provider called the hospitalist team for evaluation for admission for IV fluid hydration 02/02.  Acute hepatitis profile negative. Spiked a fever in the afternoon and blood cultures obtained and started on empiric Rocephin  and doxycycline . CXR no concerns.  02/03.  CK down to 1297 and creatinine down to 1.48.  Continue IV fluid. Resp panel 20 PCR neg.  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  Creatinine 1.69 on presentation 02/01. Trend: 02/03 Cr 1.48 --> 02/04 up to 2.71 --> 02/05 Cr 5.17 (consult to nephro) --> 02/06 Cr 6.52 and GFR 11 nephro consult renal US  urine lytes for FeNa urine protein/Cr UA w/ micro also ASO, andi-DNAse B, C3, C4 Hold nephrotoxic meds incl hydrochlorothiazide , losartan , metformin .  Rhabdomyolysis IV fluid hydration.   CK on admission 2812 --> *** today 03/16/24  Continue IV fluids per nephrology.  Hold Lipitor.   Hypotension - resolved  Held antihypertensive medications and continue IV fluid.   Hyponatremia  Likely d/t AKI / dehydration TSH WNL recently On hydrochlorothiazide  at home  monitor BMP  If persisting tomorrow will broaden workup  Low grade fever - afebrile x24h No obvious infectious cause  Repeat chest x-ray yesterday no concerns Blood cultures initial and repeat, no concerns  EBV c/w past infection but IgM neg for new infection Sputum culture pending Suspect given intermittent fever / body aches, he does have unspecified viral illness potential false negative testing      Uncontrolled type 2 diabetes mellitus with hyperglycemia, without long-term current use of insulin  Holding metformin  with impaired kidney function.   Holding Mounjaro  while in the hospital.   On sliding scale insulin .   Elevated liver enzymes Hepatic steatosis  Acute hepatitis profile negative.  Liver ultrasound showing hepatic steatosis. Monitor labs  OSA (obstructive sleep apnea) CPAP at night          Super morbid obesity based on BMI: Body mass index is 54.09 kg/m.SABRA Significantly low or high BMI is associated with higher medical risk.  Underweight - under 18  overweight - 25 to 29 obese - 30 or more Class 1 obesity: BMI of 30.0 to 34 Class 2 obesity: BMI of 35.0 to 39 Class 3 obesity: BMI of 40.0 to 49 Super Morbid Obesity: BMI  50-59 Super-super Morbid Obesity: BMI 60+ Healthy nutrition and physical activity advised as adjunct to other disease management and risk reduction treatments    DVT prophylaxis: heparin  IV fluids: 150 cc/h continuous IV fluids - adjsut per nephrology Nutrition: carb modified Central lines / other devices: none  Code Status: FULL CODE ACP documentation reviewed:  none on file in [facility]  TOC needs: none Medical barriers to dispo at this time: renal fxn. Expected readiness for discharge per TOC at this time:                Subjective / Brief ROS:  Patient reports fatigue, muscle ache Denies CP/SOB.  Pain controlled.  Denies new weakness.  Tolerating diet.  Reports no concerns w/ urination/defecation.   Family Communication: mom at bedside on rounds, pt's wife on speakerphone on rounds     Objective Findings:  Vitals:   03/15/24 1631 03/15/24 2016 03/16/24 0420 03/16/24 0744  BP: 113/71 (!) 128/98 126/71 (!) 147/88  Pulse: (!) 102 (!) 103 98 (!) 107  Resp: 18  17 20   Temp: 100.2 F (37.9 C) 99.6 F (37.6 C) 98.9 F (37.2 C) 99.5 F (37.5 C)  TempSrc:  Oral Oral Oral  SpO2: 97% 95% 95% 96%  Weight:      Height:        Intake/Output Summary (Last 24 hours) at 03/16/2024 0841 Last data filed at 03/15/2024 1917 Gross per 24 hour  Intake 240 ml  Output --  Net 240 ml   Filed Weights   03/11/24 0717 03/11/24 1815  Weight: (!) 185.9 kg (!) 191.1 kg    Examination:  Physical Exam Constitutional:      General: He is not in acute distress.    Appearance: He is not ill-appearing.  Cardiovascular:     Rate and Rhythm: Normal rate.  Pulmonary:     Effort: Pulmonary effort is normal.  Abdominal:     Palpations: Abdomen is soft.  Neurological:     General: No focal deficit present.     Mental Status: He is alert and oriented to person, place, and time.  Psychiatric:        Mood and Affect: Mood normal.        Behavior: Behavior normal.           Scheduled Medications:   doxycycline   100 mg Oral Q12H   feeding supplement  237 mL Oral BID BM   heparin   5,000 Units Subcutaneous Q8H   insulin  aspart  0-5 Units Subcutaneous QHS   insulin  aspart  0-9 Units Subcutaneous TID WC   pantoprazole   40 mg Oral BID AC   polyethylene glycol  17 g Oral BID    Continuous Infusions:  sodium chloride  150 mL/hr at 03/15/24 2136   cefTRIAXone  (ROCEPHIN )  IV 2 g (03/15/24 1811)    PRN Medications:  acetaminophen  **OR** acetaminophen , guaiFENesin , hydrALAZINE , loratadine , morphine  injection, ondansetron  **OR** ondansetron  (ZOFRAN ) IV, mouth rinse, oxyCODONE   Antimicrobials from admission:  Anti-infectives (From admission, onward)  Start     Dose/Rate Route Frequency Ordered Stop   03/12/24 2200  doxycycline  (VIBRA -TABS) tablet 100 mg        100 mg Oral Every 12 hours 03/12/24 1637     03/12/24 1800  cefTRIAXone  (ROCEPHIN ) 2 g in sodium chloride  0.9 % 100 mL IVPB        2 g 200 mL/hr over 30 Minutes Intravenous Every 24 hours 03/12/24 1636             Data Reviewed:  I have personally reviewed the following...  CBC: Recent Labs  Lab 03/11/24 0850 03/11/24 1833 03/12/24 0509 03/14/24 0505 03/15/24 0456  WBC 11.1* 8.6 9.9 14.0* 9.8  NEUTROABS 3.6  --   --   --   --   HGB 14.0 12.4* 12.2* 13.4 12.4*  HCT 41.8 36.8* 37.2* 41.1 38.2*  MCV 84.4 84.0 85.1 86.0 85.3  PLT 317 313 323 380 420*   Basic Metabolic Panel: Recent Labs  Lab 03/12/24 0509 03/13/24 0529 03/14/24 0505 03/15/24 1041 03/16/24 0451  NA 131* 132* 132* 130* 131*  K 3.9 3.8 4.5 4.4 4.4  CL 96* 98 96* 97* 98  CO2 26 26 25 22 23   GLUCOSE 129* 124* 148* 202* 114*  BUN 22* 21* 27* 48* 57*  CREATININE 1.51* 1.48* 2.71* 5.17* 6.52*  CALCIUM  7.6* 7.5* 7.7* 7.6* 7.6*   GFR: Estimated Creatinine Clearance: 28.4 mL/min (A) (by C-G formula based on SCr of 6.52 mg/dL (H)). Liver Function Tests: Recent Labs  Lab 03/11/24 0850 03/11/24 1131  03/12/24 0509  AST 185* 148* 134*  ALT 188* 153* 152*  ALKPHOS 91 75 77  BILITOT 0.7 0.6 0.6  PROT 6.9 5.8* 5.8*  ALBUMIN 3.2* 2.7* 2.7*   No results for input(s): LIPASE, AMYLASE in the last 168 hours. No results for input(s): AMMONIA in the last 168 hours. Coagulation Profile: No results for input(s): INR, PROTIME in the last 168 hours. Cardiac Enzymes: Recent Labs  Lab 03/11/24 1131 03/12/24 0509 03/13/24 0529 03/14/24 0505 03/15/24 1041  CKTOTAL 2,252* 1,759* 1,297* 1,211* 1,321*   BNP (last 3 results) No results for input(s): PROBNP in the last 8760 hours. HbA1C: No results for input(s): HGBA1C in the last 72 hours.  CBG: Recent Labs  Lab 03/15/24 1633 03/15/24 2017 03/15/24 2353 03/16/24 0405 03/16/24 0742  GLUCAP 143* 201* 143* 116* 123*   Lipid Profile: No results for input(s): CHOL, HDL, LDLCALC, TRIG, CHOLHDL, LDLDIRECT in the last 72 hours. Thyroid Function Tests: No results for input(s): TSH, T4TOTAL, FREET4, T3FREE, THYROIDAB in the last 72 hours. Anemia Panel: No results for input(s): VITAMINB12, FOLATE, FERRITIN, TIBC, IRON, RETICCTPCT in the last 72 hours. Most Recent Urinalysis On File:     Component Value Date/Time   COLORURINE YELLOW (A) 03/15/2024 1555   APPEARANCEUR CLOUDY (A) 03/15/2024 1555   APPEARANCEUR Clear 06/20/2019 1056   LABSPEC 1.010 03/15/2024 1555   PHURINE 5.0 03/15/2024 1555   GLUCOSEU NEGATIVE 03/15/2024 1555   GLUCOSEU NEGATIVE 05/20/2017 1126   HGBUR LARGE (A) 03/15/2024 1555   BILIRUBINUR NEGATIVE 03/15/2024 1555   BILIRUBINUR Negative 06/20/2019 1056   KETONESUR NEGATIVE 03/15/2024 1555   PROTEINUR 100 (A) 03/15/2024 1555   UROBILINOGEN 0.2 05/20/2017 1126   NITRITE NEGATIVE 03/15/2024 1555   LEUKOCYTESUR NEGATIVE 03/15/2024 1555   Sepsis Labs: @LABRCNTIP (procalcitonin:4,lacticidven:4) Microbiology: Recent Results (from the past 240 hours)  Resp panel by RT-PCR  (RSV, Flu A&B, Covid) Anterior Nasal Swab     Status: None  Collection Time: 03/11/24  7:37 AM   Specimen: Anterior Nasal Swab  Result Value Ref Range Status   SARS Coronavirus 2 by RT PCR NEGATIVE NEGATIVE Final    Comment: (NOTE) SARS-CoV-2 target nucleic acids are NOT DETECTED.  The SARS-CoV-2 RNA is generally detectable in upper respiratory specimens during the acute phase of infection. The lowest concentration of SARS-CoV-2 viral copies this assay can detect is 138 copies/mL. A negative result does not preclude SARS-Cov-2 infection and should not be used as the sole basis for treatment or other patient management decisions. A negative result may occur with  improper specimen collection/handling, submission of specimen other than nasopharyngeal swab, presence of viral mutation(s) within the areas targeted by this assay, and inadequate number of viral copies(<138 copies/mL). A negative result must be combined with clinical observations, patient history, and epidemiological information. The expected result is Negative.  Fact Sheet for Patients:  bloggercourse.com  Fact Sheet for Healthcare Providers:  seriousbroker.it  This test is no t yet approved or cleared by the [place]  FDA and  has been authorized for detection and/or diagnosis of SARS-CoV-2 by FDA under an Emergency Use Authorization (EUA). This EUA will remain  in effect (meaning this test can be used) for the duration of the COVID-19 declaration under Section 564(b)(1) of the Act, 21 U.S.C.section 360bbb-3(b)(1), unless the authorization is terminated  or revoked sooner.       Influenza A by PCR NEGATIVE NEGATIVE Final   Influenza B by PCR NEGATIVE NEGATIVE Final    Comment: (NOTE) The Xpert Xpress SARS-CoV-2/FLU/RSV plus assay is intended as an aid in the diagnosis of influenza from Nasopharyngeal swab specimens and should not be used as a sole basis for  treatment. Nasal washings and aspirates are unacceptable for Xpert Xpress SARS-CoV-2/FLU/RSV testing.  Fact Sheet for Patients: bloggercourse.com  Fact Sheet for Healthcare Providers: seriousbroker.it  This test is not yet approved or cleared by the [place]  FDA and has been authorized for detection and/or diagnosis of SARS-CoV-2 by FDA under an Emergency Use Authorization (EUA). This EUA will remain in effect (meaning this test can be used) for the duration of the COVID-19 declaration under Section 564(b)(1) of the Act, 21 U.S.C. section 360bbb-3(b)(1), unless the authorization is terminated or revoked.     Resp Syncytial Virus by PCR NEGATIVE NEGATIVE Final    Comment: (NOTE) Fact Sheet for Patients: bloggercourse.com  Fact Sheet for Healthcare Providers: seriousbroker.it  This test is not yet approved or cleared by the [place]  FDA and has been authorized for detection and/or diagnosis of SARS-CoV-2 by FDA under an Emergency Use Authorization (EUA). This EUA will remain in effect (meaning this test can be used) for the duration of the COVID-19 declaration under Section 564(b)(1) of the Act, 21 U.S.C. section 360bbb-3(b)(1), unless the authorization is terminated or revoked.  Performed at [facility], [address] [place], [place] [address]   Expectorated Sputum Assessment w Gram Stain, Rflx to Resp Cult     Status: None   Collection Time: 03/12/24  4:00 PM   Specimen: Salivary Gland; Sputum  Result Value Ref Range Status   Specimen Description SALIVA  Final   Special Requests EXSPU  Final   Sputum evaluation   Final    THIS SPECIMEN IS ACCEPTABLE FOR SPUTUM CULTURE Performed at [facility], [address] [place], [place] [address]    Report Status 03/12/2024 FINAL  Final  Culture, Respiratory w Gram Stain     Status: None  Collection Time: 03/12/24  4:00 PM   Specimen: Salivary Gland  Result Value Ref Range Status   Specimen Description   Final    SALIVA Performed at [facility], [address] [place], [place] [address]    Special Requests   Final    EXSPU Reflexed from [id number] Performed at [facility], [address] [place], [place] [address]    Gram Stain   Final    FEW WBC PRESENT, PREDOMINANTLY PMN FEW GRAM POSITIVE COCCI    Culture   Final    MODERATE Normal respiratory flora-no Staph aureus or Pseudomonas seen Performed at [facility], [address], [place], [place] [address]    Report Status 03/15/2024 FINAL  Final  CULTURE, BLOOD (ROUTINE X 2) w Reflex to ID Panel     Status: None (Preliminary result)   Collection Time: 03/12/24  5:40 PM   Specimen: BLOOD  Result Value Ref Range Status   Specimen Description BLOOD BLOOD LEFT ARM  Final   Special Requests   Final    BOTTLES DRAWN AEROBIC AND ANAEROBIC Blood Culture adequate volume   Culture   Final    NO GROWTH 4 DAYS Performed at [facility], [address] [place], [place] [address]    Report Status PENDING  Incomplete  CULTURE, BLOOD (ROUTINE X 2) w Reflex to ID Panel     Status: None (Preliminary result)   Collection Time: 03/12/24  5:40 PM   Specimen: BLOOD  Result Value Ref Range Status   Specimen Description BLOOD BLOOD LEFT HAND  Final   Special Requests   Final    BOTTLES DRAWN AEROBIC AND ANAEROBIC Blood Culture adequate volume   Culture   Final    NO GROWTH 4 DAYS Performed at [facility], [address] [place], [place] [address]    Report Status PENDING  Incomplete  Respiratory (~20 pathogens) panel by PCR     Status: None   Collection Time: 03/13/24  6:55 AM   Specimen: Nasopharyngeal Swab; Respiratory  Result Value Ref Range Status   Adenovirus NOT DETECTED NOT DETECTED Final   Coronavirus 229E NOT DETECTED NOT DETECTED Final    Comment: (NOTE) The  Coronavirus on the Respiratory Panel, DOES NOT test for the novel  Coronavirus (2019 nCoV)    Coronavirus HKU1 NOT DETECTED NOT DETECTED Final   Coronavirus NL63 NOT DETECTED NOT DETECTED Final   Coronavirus OC43 NOT DETECTED NOT DETECTED Final   Metapneumovirus NOT DETECTED NOT DETECTED Final   Rhinovirus / Enterovirus NOT DETECTED NOT DETECTED Final   Influenza A NOT DETECTED NOT DETECTED Final   Influenza B NOT DETECTED NOT DETECTED Final   Parainfluenza Virus 1 NOT DETECTED NOT DETECTED Final   Parainfluenza Virus 2 NOT DETECTED NOT DETECTED Final   Parainfluenza Virus 3 NOT DETECTED NOT DETECTED Final   Parainfluenza Virus 4 NOT DETECTED NOT DETECTED Final   Respiratory Syncytial Virus NOT DETECTED NOT DETECTED Final   Bordetella pertussis NOT DETECTED NOT DETECTED Final   Bordetella Parapertussis NOT DETECTED NOT DETECTED Final   Chlamydophila pneumoniae NOT DETECTED NOT DETECTED Final   Mycoplasma pneumoniae NOT DETECTED NOT DETECTED Final    Comment: Performed at [facility], [address], [place], [place] [address]      Radiology Studies last 3 days: US  RENAL Result Date: 03/15/2024 CLINICAL DATA:  Acute kidney injury EXAM: RENAL / URINARY TRACT ULTRASOUND COMPLETE COMPARISON:  None available FINDINGS: Right Kidney:  Renal measurements: 15.4 x 9.2 x 10.0 cm = volume: 71 mL. Increased echogenicity the renal cortex without significant thinning. No hydronephrosis. No mass. Left Kidney: Renal measurements: 15.4 x 9.5 x 8.0 cm = volume: 610 mL. Increased echogenicity the renal cortex without significant thinning. Bladder: Appears normal for degree of bladder distention. Other: None. IMPRESSION: 1. Increased echogenicity the renal cortex without significant thinning consistent with acute kidney injury. 2. No hydronephrosis. Electronically Signed   By: Aliene Lloyd M.D.   On: 03/15/2024 15:45   DG Chest Port 1 View Result Date: 03/13/2024 CLINICAL DATA:  [id number] Fever [id number]. EXAM:  PORTABLE CHEST 1 VIEW COMPARISON:  03/11/2024. FINDINGS: Low lung volume. Bilateral lung fields are clear. Bilateral costophrenic angles are clear. Stable cardio-mediastinal silhouette. No acute osseous abnormalities. The soft tissues are within normal limits. IMPRESSION: No active disease. Electronically Signed   By: Ree Molt M.D.   On: 03/13/2024 10:49          Mabelle Mungin, DO [facility] 03/16/2024, 8:41 AM    Dictation software may have been used to generate the above note. Typos may occur and escape review in typed/dictated notes. Please contact Dr Marsa directly for clarity if needed.  Staff may message me via secure chat in Epic  but this may not receive an immediate response,  please page me for urgent matters!  If 7PM-7AM, please contact night coverage [email or web address]       "

## 2024-03-16 NOTE — Consult Note (Addendum)
 " [facility]  CONSULT NOTE    Date: 03/16/2024                  Patient Name:  Rickey Payne  MRN: [id number]  DOB: 1990/09/21  Age / Sex: 34 y.o., male         PCP: [name], MD                 Service Requesting Consult: TRH                 Reason for Consult: Acute kidney injury            History of Present Illness: [name] is a 34 y.o.  male with past medical conditions including asthma, hypertension, diabetes, hyperlipidemia, and obesity, who was admitted to [facility] on 03/11/2024 for Rhabdomyolysis [M62.82] AKI (acute kidney injury) [N17.9] Non-traumatic rhabdomyolysis [M62.82]  Patient presents to the emergency department complaining of bodyaches and pains.  Patient seen resting in bed at this time.  Request to call mother on speaker phone.  States he has felt unwell at home since early to mid January.  Was recently treated for streptococcal throat infection with amoxicillin .  States during that time of treatment, endorsed poor oral intake.  Endorsed nausea without vomiting.  Denies NSAID use.  States he has not noticed blood in his urine since admission.  States during his time of recovery, was mostly inactive.  Concerning labs on ED arrival include sodium 130, creatinine 1.69 with GFR 54, CK20 800, and white count 11.1.  UA appears hazy with some hematuria and mild proteinuria.  Renal ultrasound negative for obstruction.  Renal function has continued to deteriorate across this admission, 6.52 with GFR 11.  Baseline creatinine appears to be 0.7 in November.   Medications: Outpatient medications: Medications Prior to Admission  Medication Sig Dispense Refill Last Dose/Taking   acetaminophen  (TYLENOL ) 500 MG tablet Take 1,000 mg by mouth every 6 (six) hours as needed for mild pain (pain score 1-3).   Unknown   atorvastatin  (LIPITOR) 20 MG tablet TAKE 1 TABLET(20 MG) BY MOUTH DAILY 90 tablet 3 03/10/2024   fluticasone (FLONASE) 50 MCG/ACT nasal  spray Place 1 spray into both nostrils daily as needed for allergies or rhinitis.   Unknown   hydrochlorothiazide  (HYDRODIURIL ) 25 MG tablet Take 1 tablet (25 mg total) by mouth daily. 30 tablet 3 03/10/2024   loratadine  (CLARITIN ) 10 MG tablet Take 10 mg by mouth daily as needed for allergies.   Unknown   losartan  (COZAAR ) 100 MG tablet TAKE 1 TABLET(100 MG) BY MOUTH DAILY 90 tablet 3 03/10/2024   metFORMIN  (GLUCOPHAGE -XR) 500 MG 24 hr tablet Take 1 tablet (500 mg total) by mouth daily with breakfast. 90 tablet 3 03/10/2024   Multiple Vitamin (MULTIVITAMIN PO) Take by mouth.   Past Week   ondansetron  (ZOFRAN ) 4 MG tablet Take 1 tablet (4 mg total) by mouth 2 (two) times daily as needed. 90 tablet 1 Unknown   tirzepatide  (MOUNJARO ) 15 MG/0.5ML Pen Inject 15 mg into the skin once a week. 6 mL 3 03/07/2024   Vitamin D , Ergocalciferol , (DRISDOL ) 1.25 MG (50000 UNIT) CAPS capsule Take 1 capsule (50,000 Units total) by mouth every 7 (seven) days. 12 capsule 1 Past Week   Blood Glucose Monitoring Suppl (ONETOUCH VERIO FLEX SYSTEM) w/Device KIT Use to monitor glucose twice daily 1 kit 0    glucose blood (ONETOUCH VERIO) test strip Use as instructed 100 each  12    Lancets (ONETOUCH DELICA PLUS LANCET33G) MISC USE THREE TIMES DAILY AS DIRECTED 300 each 12     Current medications: Current Facility-Administered Medications  Medication Dose Route Frequency Provider Last Rate Last Admin   0.9 %  sodium chloride  infusion   Intravenous Continuous Holden Draughon, NP       acetaminophen  (TYLENOL ) tablet 650 mg  650 mg Oral Q6H PRN Paudel, Keshab, MD   650 mg at 03/15/24 2029   Or   acetaminophen  (TYLENOL ) suppository 650 mg  650 mg Rectal Q6H PRN Roann Gouty, MD       cefTRIAXone  (ROCEPHIN ) 2 g in sodium chloride  0.9 % 100 mL IVPB  2 g Intravenous Q24H Josette Ade, MD 200 mL/hr at 03/15/24 1811 2 g at 03/15/24 1811   doxycycline  (VIBRA -TABS) tablet 100 mg  100 mg Oral Q12H Wieting, Richard, MD   100 mg  at 03/16/24 0830   feeding supplement (NEPRO CARB STEADY) liquid 237 mL  237 mL Oral BID BM Anfernee Peschke, NP   237 mL at 03/16/24 1329   guaiFENesin  (ROBITUSSIN) 100 MG/5ML liquid 10 mL  10 mL Oral Q4H PRN Paudel, Gouty, MD   10 mL at 03/14/24 1025   heparin  injection 5,000 Units  5,000 Units Subcutaneous Q8H Paudel, Gouty, MD   5,000 Units at 03/16/24 1325   hydrALAZINE  (APRESOLINE ) injection 10 mg  10 mg Intravenous Q6H PRN Roann Gouty, MD       insulin  aspart (novoLOG ) injection 0-5 Units  0-5 Units Subcutaneous QHS Paudel, Gouty, MD       insulin  aspart (novoLOG ) injection 0-9 Units  0-9 Units Subcutaneous TID WC Paudel, Keshab, MD   3 Units at 03/16/24 1147   loratadine  (CLARITIN ) tablet 10 mg  10 mg Oral Daily PRN Josette Ade, MD       morphine  (PF) 2 MG/ML injection 2 mg  2 mg Intravenous Q4H PRN Paudel, Keshab, MD   2 mg at 03/12/24 1651   ondansetron  (ZOFRAN ) tablet 4 mg  4 mg Oral Q6H PRN Roann Gouty, MD       Or   ondansetron  (ZOFRAN ) injection 4 mg  4 mg Intravenous Q6H PRN Paudel, Keshab, MD   4 mg at 03/15/24 0818   Oral care mouth rinse  15 mL Mouth Rinse PRN Paudel, Gouty, MD       oxyCODONE  (Oxy IR/ROXICODONE ) immediate release tablet 5 mg  5 mg Oral Q4H PRN Paudel, Keshab, MD   5 mg at 03/15/24 0817   pantoprazole  (PROTONIX ) EC tablet 40 mg  40 mg Oral BID AC Paudel, Keshab, MD   40 mg at 03/16/24 0830   polyethylene glycol (MIRALAX  / GLYCOLAX ) packet 17 g  17 g Oral BID Josette Ade, MD   17 g at 03/16/24 0830      Allergies: Allergies[1]    Past Medical History: Past Medical History:  Diagnosis Date   Asthma    child   COVID-19    x2   Diabetes mellitus without complication (HCC)    Hyperlipidemia    Hypertension    Morbid obesity (HCC)    Sinus tachycardia    Vitamin D  deficiency 05/20/2017     Past Surgical History: Past Surgical History:  Procedure Laterality Date   right wrist fracture       Family History: Family History   Problem Relation Age of Onset   Hypertension Mother    Arthritis Mother    Asthma Mother    Depression Mother  Hyperlipidemia Mother    Diabetes Mother    Hypertension Father    Depression Father    Hyperlipidemia Father    Intellectual disability Father    Stroke Father    Hepatitis C Father    Kidney failure Father        s/p transplant    Heart disease Maternal Grandfather    Diabetes Paternal Grandmother    Drug abuse Paternal Grandmother    Diabetes Maternal Grandmother      Social History: Social History   Socioeconomic History   Marital status: Married    Spouse name: Not on file   Number of children: Not on file   Years of education: Not on file   Highest education level: Not on file  Occupational History   Not on file  Tobacco Use   Smoking status: Never   Smokeless tobacco: Never  Substance and Sexual Activity   Alcohol use: Yes    Alcohol/week: 1.0 standard drink of alcohol    Types: 1 Cans of beer per week    Comment: occasional   Drug use: No   Sexual activity: Yes  Other Topics Concern   Not on file  Social History Narrative   Diploma, deputy detention officer    Married    2 kids 7 y.o boy, 6 month boy, wife pregnant as of 06/20/19    -as of 01/23/20 pt has 3 boys       Drinks occasionally    Never smoker, no chew    Owns guns, wears seat belt, safe in relationship       Social Drivers of Health   Tobacco Use: Low Risk (03/11/2024)   Patient History    Smoking Tobacco Use: Never    Smokeless Tobacco Use: Never    Passive Exposure: Not on file  Financial Resource Strain: Not on file  Food Insecurity: Food Insecurity Present (03/11/2024)   Epic    Worried About Programme Researcher, Broadcasting/film/video in the Last Year: Sometimes true    Ran Out of Food in the Last Year: Sometimes true  Transportation Needs: No Transportation Needs (03/11/2024)   Epic    Lack of Transportation (Medical): No    Lack of Transportation (Non-Medical): No  Physical Activity: Not  on file  Stress: Not on file  Social Connections: Unknown (03/11/2024)   Social Connection and Isolation Panel    Frequency of Communication with Friends and Family: More than three times a week    Frequency of Social Gatherings with Friends and Family: Once a week    Attends Religious Services: More than 4 times per year    Active Member of [employer] or Organizations: Not on file    Attends Banker Meetings: Not on file    Marital Status: Married  Intimate Partner Violence: Not At Risk (03/11/2024)   Epic    Fear of Current or Ex-Partner: No    Emotionally Abused: No    Physically Abused: No    Sexually Abused: No  Depression (PHQ2-9): Low Risk (11/08/2023)   Depression (PHQ2-9)    PHQ-2 Score: 0  Alcohol Screen: Not on file  Housing: Low Risk (03/11/2024)   Epic    Unable to Pay for Housing in the Last Year: No    Number of Times Moved in the Last Year: 0    Homeless in the Last Year: No  Utilities: Patient Declined (03/11/2024)   Epic    Threatened with loss of utilities: Patient declined  Health Literacy: Not on file     Review of Systems: Review of Systems  Constitutional:  Positive for malaise/fatigue. Negative for chills and fever.  HENT:  Negative for congestion, sore throat and tinnitus.   Eyes:  Negative for blurred vision and redness.  Respiratory:  Negative for cough, shortness of breath and wheezing.   Cardiovascular:  Negative for chest pain, palpitations, claudication and leg swelling.  Gastrointestinal:  Positive for nausea. Negative for abdominal pain, blood in stool, diarrhea and vomiting.  Genitourinary:  Negative for flank pain, frequency and hematuria.  Musculoskeletal:  Negative for back pain, falls and myalgias.       Generalized body aches and pain  Skin:  Negative for rash.  Neurological:  Negative for dizziness, weakness and headaches.  Endo/Heme/Allergies:  Does not bruise/bleed easily.  Psychiatric/Behavioral:  Negative for depression. The  patient is not nervous/anxious and does not have insomnia.     Vital Signs: Blood pressure (!) 147/88, pulse (!) 107, temperature 99.5 F (37.5 C), temperature source Oral, resp. rate 20, height 6' 2 (1.88 m), weight (!) 191.1 kg, SpO2 96%.  Weight trends: Filed Weights   03/11/24 0717 03/11/24 1815  Weight: (!) 185.9 kg (!) 191.1 kg    Physical Exam: General: NAD, large body habitus  Head: Normocephalic, atraumatic. Moist oral mucosal membranes  Eyes: Anicteric  Lungs:  Clear to auscultation  Heart: Regular rate and rhythm  Abdomen:  Soft, nontender, obese  Extremities: No peripheral edema.  Neurologic: Alert and oriented  Skin: No lesions  Access: None     Lab results: Basic Metabolic Panel: Recent Labs  Lab 03/14/24 0505 03/15/24 1041 03/16/24 0451  NA 132* 130* 131*  K 4.5 4.4 4.4  CL 96* 97* 98  CO2 25 22 23   GLUCOSE 148* 202* 114*  BUN 27* 48* 57*  CREATININE 2.71* 5.17* 6.52*  CALCIUM  7.7* 7.6* 7.6*    Liver Function Tests: Recent Labs  Lab 03/11/24 1131 03/12/24 0509 03/16/24 0450  AST 148* 134* 156*  ALT 153* 152* 165*  ALKPHOS 75 77 98  BILITOT 0.6 0.6 0.3  PROT 5.8* 5.8* 5.6*  ALBUMIN 2.7* 2.7* 2.2*   No results for input(s): LIPASE, AMYLASE in the last 168 hours. No results for input(s): AMMONIA in the last 168 hours.  CBC: Recent Labs  Lab 03/11/24 0850 03/11/24 1833 03/12/24 0509 03/14/24 0505 03/15/24 0456  WBC 11.1* 8.6 9.9 14.0* 9.8  NEUTROABS 3.6  --   --   --   --   HGB 14.0 12.4* 12.2* 13.4 12.4*  HCT 41.8 36.8* 37.2* 41.1 38.2*  MCV 84.4 84.0 85.1 86.0 85.3  PLT 317 313 323 380 420*    Cardiac Enzymes: Recent Labs  Lab 03/12/24 0509 03/13/24 0529 03/14/24 0505 03/15/24 1041 03/16/24 0450  CKTOTAL 1,759* 1,297* 1,211* 1,321* 1,189*    BNP: Invalid input(s): POCBNP  CBG: Recent Labs  Lab 03/15/24 2017 03/15/24 2353 03/16/24 0405 03/16/24 0742 03/16/24 1124  GLUCAP 201* 143* 116* 123* 216*     Microbiology: Results for orders placed or performed during the hospital encounter of 03/11/24  Resp panel by RT-PCR (RSV, Flu A&B, Covid) Anterior Nasal Swab     Status: None   Collection Time: 03/11/24  7:37 AM   Specimen: Anterior Nasal Swab  Result Value Ref Range Status   SARS Coronavirus 2 by RT PCR NEGATIVE NEGATIVE Final    Comment: (NOTE) SARS-CoV-2 target nucleic acids are NOT DETECTED.  The SARS-CoV-2 RNA is generally  detectable in upper respiratory specimens during the acute phase of infection. The lowest concentration of SARS-CoV-2 viral copies this assay can detect is 138 copies/mL. A negative result does not preclude SARS-Cov-2 infection and should not be used as the sole basis for treatment or other patient management decisions. A negative result may occur with  improper specimen collection/handling, submission of specimen other than nasopharyngeal swab, presence of viral mutation(s) within the areas targeted by this assay, and inadequate number of viral copies(<138 copies/mL). A negative result must be combined with clinical observations, patient history, and epidemiological information. The expected result is Negative.  Fact Sheet for Patients:  bloggercourse.com  Fact Sheet for Healthcare Providers:  seriousbroker.it  This test is no t yet approved or cleared by the [place]  FDA and  has been authorized for detection and/or diagnosis of SARS-CoV-2 by FDA under an Emergency Use Authorization (EUA). This EUA will remain  in effect (meaning this test can be used) for the duration of the COVID-19 declaration under Section 564(b)(1) of the Act, 21 U.S.C.section 360bbb-3(b)(1), unless the authorization is terminated  or revoked sooner.       Influenza A by PCR NEGATIVE NEGATIVE Final   Influenza B by PCR NEGATIVE NEGATIVE Final    Comment: (NOTE) The Xpert Xpress SARS-CoV-2/FLU/RSV plus assay is  intended as an aid in the diagnosis of influenza from Nasopharyngeal swab specimens and should not be used as a sole basis for treatment. Nasal washings and aspirates are unacceptable for Xpert Xpress SARS-CoV-2/FLU/RSV testing.  Fact Sheet for Patients: bloggercourse.com  Fact Sheet for Healthcare Providers: seriousbroker.it  This test is not yet approved or cleared by the [place]  FDA and has been authorized for detection and/or diagnosis of SARS-CoV-2 by FDA under an Emergency Use Authorization (EUA). This EUA will remain in effect (meaning this test can be used) for the duration of the COVID-19 declaration under Section 564(b)(1) of the Act, 21 U.S.C. section 360bbb-3(b)(1), unless the authorization is terminated or revoked.     Resp Syncytial Virus by PCR NEGATIVE NEGATIVE Final    Comment: (NOTE) Fact Sheet for Patients: bloggercourse.com  Fact Sheet for Healthcare Providers: seriousbroker.it  This test is not yet approved or cleared by the [place]  FDA and has been authorized for detection and/or diagnosis of SARS-CoV-2 by FDA under an Emergency Use Authorization (EUA). This EUA will remain in effect (meaning this test can be used) for the duration of the COVID-19 declaration under Section 564(b)(1) of the Act, 21 U.S.C. section 360bbb-3(b)(1), unless the authorization is terminated or revoked.  Performed at [facility], [address] [place], [place] [address]   Expectorated Sputum Assessment w Gram Stain, Rflx to Resp Cult     Status: None   Collection Time: 03/12/24  4:00 PM   Specimen: Salivary Gland; Sputum  Result Value Ref Range Status   Specimen Description SALIVA  Final   Special Requests EXSPU  Final   Sputum evaluation   Final    THIS SPECIMEN IS ACCEPTABLE FOR SPUTUM CULTURE Performed at [facility], [address], [place], [place] [address]    Report Status 03/12/2024 FINAL  Final  Culture, Respiratory w Gram Stain     Status: None   Collection Time: 03/12/24  4:00 PM   Specimen: Salivary Gland  Result Value Ref Range Status   Specimen Description   Final    SALIVA Performed at [facility], [address], [place], [place] [address]    Special  Requests   Final    EXSPU Reflexed from [id number] Performed at [facility], [address] [place], [place] [address]    Gram Stain   Final    FEW WBC PRESENT, PREDOMINANTLY PMN FEW GRAM POSITIVE COCCI    Culture   Final    MODERATE Normal respiratory flora-no Staph aureus or Pseudomonas seen Performed at [facility], [address], [place], [place] [address]    Report Status 03/15/2024 FINAL  Final  CULTURE, BLOOD (ROUTINE X 2) w Reflex to ID Panel     Status: None (Preliminary result)   Collection Time: 03/12/24  5:40 PM   Specimen: BLOOD  Result Value Ref Range Status   Specimen Description BLOOD BLOOD LEFT ARM  Final   Special Requests   Final    BOTTLES DRAWN AEROBIC AND ANAEROBIC Blood Culture adequate volume   Culture   Final    NO GROWTH 4 DAYS Performed at [facility], [address] [place], [place] [address]    Report Status PENDING  Incomplete  CULTURE, BLOOD (ROUTINE X 2) w Reflex to ID Panel     Status: None (Preliminary result)   Collection Time: 03/12/24  5:40 PM   Specimen: BLOOD  Result Value Ref Range Status   Specimen Description BLOOD BLOOD LEFT HAND  Final   Special Requests   Final    BOTTLES DRAWN AEROBIC AND ANAEROBIC Blood Culture adequate volume   Culture   Final    NO GROWTH 4 DAYS Performed at [facility], [address] [place], [place] [address]    Report Status PENDING  Incomplete  Respiratory (~20 pathogens) panel by PCR     Status: None   Collection Time: 03/13/24  6:55 AM   Specimen: Nasopharyngeal Swab; Respiratory  Result Value Ref Range Status    Adenovirus NOT DETECTED NOT DETECTED Final   Coronavirus 229E NOT DETECTED NOT DETECTED Final    Comment: (NOTE) The Coronavirus on the Respiratory Panel, DOES NOT test for the novel  Coronavirus (2019 nCoV)    Coronavirus HKU1 NOT DETECTED NOT DETECTED Final   Coronavirus NL63 NOT DETECTED NOT DETECTED Final   Coronavirus OC43 NOT DETECTED NOT DETECTED Final   Metapneumovirus NOT DETECTED NOT DETECTED Final   Rhinovirus / Enterovirus NOT DETECTED NOT DETECTED Final   Influenza A NOT DETECTED NOT DETECTED Final   Influenza B NOT DETECTED NOT DETECTED Final   Parainfluenza Virus 1 NOT DETECTED NOT DETECTED Final   Parainfluenza Virus 2 NOT DETECTED NOT DETECTED Final   Parainfluenza Virus 3 NOT DETECTED NOT DETECTED Final   Parainfluenza Virus 4 NOT DETECTED NOT DETECTED Final   Respiratory Syncytial Virus NOT DETECTED NOT DETECTED Final   Bordetella pertussis NOT DETECTED NOT DETECTED Final   Bordetella Parapertussis NOT DETECTED NOT DETECTED Final   Chlamydophila pneumoniae NOT DETECTED NOT DETECTED Final   Mycoplasma pneumoniae NOT DETECTED NOT DETECTED Final    Comment: Performed at [facility], [address] [place], [place] [address]    Coagulation Studies: No results for input(s): LABPROT, INR in the last 72 hours.  Urinalysis: Recent Labs    03/15/24 1555  COLORURINE YELLOW*  LABSPEC 1.010  PHURINE 5.0  GLUCOSEU NEGATIVE  HGBUR LARGE*  BILIRUBINUR NEGATIVE  KETONESUR NEGATIVE  PROTEINUR 100*  NITRITE NEGATIVE  LEUKOCYTESUR NEGATIVE      Imaging: US  RENAL Result Date: 03/15/2024 CLINICAL DATA:  Acute kidney injury EXAM: RENAL / URINARY TRACT ULTRASOUND COMPLETE  COMPARISON:  None available FINDINGS: Right Kidney: Renal measurements: 15.4 x 9.2 x 10.0 cm = volume: 71 mL. Increased echogenicity the renal cortex without significant thinning. No hydronephrosis. No mass. Left Kidney: Renal measurements: 15.4 x 9.5 x 8.0 cm = volume: 610 mL. Increased  echogenicity the renal cortex without significant thinning. Bladder: Appears normal for degree of bladder distention. Other: None. IMPRESSION: 1. Increased echogenicity the renal cortex without significant thinning consistent with acute kidney injury. 2. No hydronephrosis. Electronically Signed   By: Aliene Lloyd M.D.   On: 03/15/2024 15:45     Assessment & Plan: Mr. JAYSIAH MARCHETTA is a 34 y.o.  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  Recent streptococcal throat infection in January, completed amoxicillin .  Baseline creatinine 0.7 in November 2025.  Renal ultrasound negative for obstruction.  Worsening renal function along with hematuria since this timeline.  Awaiting pending serologies which include ANA, ANCA, complements, kappa light chains, GBM, ASO.  Will defer renal biopsy at this time.  Continue IV fluids, will decrease rate to 100 mL/h.  Patient encouraged to maintain oral intake.  Will offer Nepro supplementation twice daily.  No acute indication for dialysis however if kidney function continues to decline, may have to consider.  Will continue to monitor.  2.  Hyponatremia sodium has maintained between 130-132 during this admission.  Likely secondary to poor oral intake.  Continue to encourage oral intake and protein supplementation.  Patient also encouraged to limit free water intake.  3.  Rhabdomyolysis, unclear etiology.  CK20 800 on ED arrival.  Initiated high rate IV fluids, currently 1297.  Continue IV fluids at decreased rate as above.     LOS: 4 Taima Rada 2/6/20262:46 PM      [1] No Known Allergies  "

## 2024-03-16 NOTE — Plan of Care (Signed)

## 2024-05-04 ENCOUNTER — Ambulatory Visit: Admitting: Nurse Practitioner
